# Patient Record
Sex: Female | Born: 1975 | Race: White | Hispanic: No | Marital: Single | Smoking: Current every day smoker
Health system: Southern US, Community
[De-identification: ages and names within clinical notes are randomized; demographics above are authoritative.]

## PROBLEM LIST (undated history)

## (undated) DIAGNOSIS — N83209 Unspecified ovarian cyst, unspecified side: Secondary | ICD-10-CM

## (undated) DIAGNOSIS — D649 Anemia, unspecified: Secondary | ICD-10-CM

## (undated) DIAGNOSIS — I10 Essential (primary) hypertension: Secondary | ICD-10-CM

## (undated) DIAGNOSIS — G43909 Migraine, unspecified, not intractable, without status migrainosus: Secondary | ICD-10-CM

## (undated) DIAGNOSIS — F419 Anxiety disorder, unspecified: Secondary | ICD-10-CM

## (undated) DIAGNOSIS — E039 Hypothyroidism, unspecified: Secondary | ICD-10-CM

## (undated) DIAGNOSIS — D519 Vitamin B12 deficiency anemia, unspecified: Secondary | ICD-10-CM

## (undated) DIAGNOSIS — Z87891 Personal history of nicotine dependence: Secondary | ICD-10-CM

## (undated) DIAGNOSIS — R87619 Unspecified abnormal cytological findings in specimens from cervix uteri: Secondary | ICD-10-CM

## (undated) DIAGNOSIS — J45909 Unspecified asthma, uncomplicated: Secondary | ICD-10-CM

## (undated) DIAGNOSIS — G4730 Sleep apnea, unspecified: Secondary | ICD-10-CM

## (undated) DIAGNOSIS — I4891 Unspecified atrial fibrillation: Secondary | ICD-10-CM

## (undated) HISTORY — DX: Essential (primary) hypertension: I10

## (undated) HISTORY — DX: Unspecified abnormal cytological findings in specimens from cervix uteri: R87.619

## (undated) HISTORY — DX: Anxiety disorder, unspecified: F41.9

## (undated) HISTORY — DX: Migraine, unspecified, not intractable, without status migrainosus: G43.909

## (undated) HISTORY — PX: BREAST EXCISIONAL BIOPSY: SUR124

## (undated) HISTORY — DX: Vitamin B12 deficiency anemia, unspecified: D51.9

## (undated) HISTORY — PX: TONSILLECTOMY: SUR1361

## (undated) HISTORY — DX: Unspecified ovarian cyst, unspecified side: N83.209

## (undated) HISTORY — PX: CERVICAL BIOPSY  W/ LOOP ELECTRODE EXCISION: SUR135

## (undated) HISTORY — DX: Unspecified asthma, uncomplicated: J45.909

## (undated) HISTORY — DX: Anemia, unspecified: D64.9

## (undated) HISTORY — PX: TUBAL LIGATION: SHX77

---

## 2012-07-08 HISTORY — PX: BREAST BIOPSY: SHX20

## 2017-07-20 ENCOUNTER — Encounter (HOSPITAL_COMMUNITY): Payer: Self-pay | Admitting: Emergency Medicine

## 2017-07-20 ENCOUNTER — Ambulatory Visit (HOSPITAL_COMMUNITY)
Admission: EM | Admit: 2017-07-20 | Discharge: 2017-07-20 | Disposition: A | Discharge status: Home / Self Care | Payer: Self-pay | Attending: Internal Medicine | Admitting: Internal Medicine

## 2017-07-20 DIAGNOSIS — M7662 Achilles tendinitis, left leg: Secondary | ICD-10-CM

## 2017-07-20 DIAGNOSIS — E039 Hypothyroidism, unspecified: Secondary | ICD-10-CM

## 2017-07-20 MED ORDER — LEVOTHYROXINE SODIUM 75 MCG PO TABS: 75.0000 ug | ORAL_TABLET | Freq: Every day | ORAL | 0 refills | Status: DC

## 2017-07-20 MED ORDER — METHYLPREDNISOLONE SODIUM SUCC 125 MG IJ SOLR
80.0000 mg | Freq: Once | INTRAMUSCULAR | Status: AC
  Administered 2017-07-20: 80 mg via INTRAMUSCULAR

## 2017-07-20 MED ORDER — MELOXICAM 7.5 MG PO TABS: 7.5000 mg | ORAL_TABLET | Freq: Two times a day (BID) | ORAL | 0 refills | Status: AC

## 2017-07-20 MED ORDER — METHYLPREDNISOLONE SODIUM SUCC 125 MG IJ SOLR
INTRAMUSCULAR | Status: AC
  Filled 2017-07-20: qty 2

## 2017-07-20 NOTE — ED Provider Notes (Signed)
[facility]    CSN: [id number] Arrival date & time: 07/20/17  1828     History   Chief Complaint Chief Complaint  Patient presents with  . Tendonitis    HPI Emily Maldonado is a 42 y.o. female.   She presents today after a forceful dorsiflexion of her left foot yesterday, stepping off the edge of a dog, which has caused significant swelling and pain at the base of the Achilles tendon.  She has injured her left Achilles tendon before.  She was able to walk into the urgent care independently, but has not been able to put a shoe on because of pain in the posterior left heel.  No other injury reported Also, recently moved to this area from [place] and is looking for PCP.  Needs a refill on Synthroid 75 mics per day.    HPI  History reviewed. Hx hypothyroidism, achilles tendinitis  History reviewed. No pertinent surgical history.    Home Medications    Prior to Admission medications   Medication Sig Start Date End Date Taking? Authorizing Provider  levothyroxine (SYNTHROID) 75 MCG tablet Take 1 tablet (75 mcg total) by mouth daily before breakfast. 07/20/17 08/19/17  Isa RankinMurray, Keyoni Lapinski Wilson, MD  meloxicam (MOBIC) 7.5 MG tablet Take 1 tablet (7.5 mg total) by mouth 2 (two) times daily for 15 days. 07/20/17 08/04/17  Isa RankinMurray, Anderia Lorenzo Wilson, MD    Family History History reviewed. No pertinent family history.  Social History Social History   Tobacco Use  . Smoking status: Current Every Day Smoker    Packs/day: 1.00    Types: Cigarettes  . Smokeless tobacco: Never Used  Substance Use Topics  . Alcohol use: Yes  . Drug use: Not on file     Allergies   Penicillins; Shellfish allergy; and Zithromax [azithromycin]   Review of Systems Review of Systems  All other systems reviewed and are negative.    Physical Exam Triage Vital Signs ED Triage Vitals  Enc Vitals Group     BP 07/20/17 1902 137/80     Pulse Rate 07/20/17 1902 91     Resp 07/20/17 1902 18   Temp 07/20/17 1902 98.6 F (37 C)     Temp Source 07/20/17 1902 Oral     SpO2 07/20/17 1902 98 %     Weight --      Height --      Pain Score 07/20/17 1927 6     Pain Loc --    Updated Vital Signs BP 137/80 (BP Location: Left Arm)   Pulse 91   Temp 98.6 F (37 C) (Oral)   Resp 18   SpO2 98%  Physical Exam  Constitutional: She is oriented to person, place, and time. No distress.  HENT:  Head: Atraumatic.  Eyes:  Conjugate gaze observed, no eye redness/discharge  Neck: Neck supple.  Cardiovascular: Normal rate.  Pulmonary/Chest: No respiratory distress.  Abdominal: She exhibits no distension.  Musculoskeletal: Normal range of motion.  Left foot and ankle are slightly swollen compared to the right, most pronounced at the distal aspect of the Achilles tendon insertion in the posterior left heel.  No bruising.  Slightly red, slightly warm.  Good range of motion at the ankle, able to walk but painful. Left calf squeeze produces foot extension at ankle.  Neurological: She is alert and oriented to person, place, and time.  Skin: Skin is warm and dry.  Nursing note and vitals reviewed.    UC Treatments / Results  Procedures Procedures (including critical care time)  Medications Ordered in UC Medications  methylPREDNISolone sodium succinate (SOLU-MEDROL) 125 mg/2 mL injection 80 mg (80 mg Intramuscular Given 07/20/17 1930)   Not able to tolerate boot orthosis due to pain from boot on heel  Final Clinical Impressions(s) / UC Diagnoses   Final diagnoses:  Achilles tendinitis of left lower extremity  Hypothyroidism, unspecified type   Exam findings consistent with mild re-injury of achilles tendon (left).  Injection of solumedrol (steroid) was given at the urgent care today to help with swelling/pain.  Prescription for meloxicam (anti inflammatory/pain reliever) and also for synthroid (for thyroid) were sent to the pharmacy.  Ice for 5-10 minutes several times daily to help with  swelling and pain.  Wear boot when up and around to compress and decrease movement and pain at the achilles tendon insertion site on the heel.  Followup with sports med or podiatry in a week or two for next steps.    ED Discharge Orders        Ordered    levothyroxine (SYNTHROID) 75 MCG tablet  Daily before breakfast     07/20/17 1917    meloxicam (MOBIC) 7.5 MG tablet  2 times daily     07/20/17 1917          Isa Rankin, MD 07/21/17 [phone]

## 2017-07-20 NOTE — ED Triage Notes (Signed)
Pt reports she inj her left achilles tendon yest while stepping on to a deck..  See Dr. Rennie PlowmanMurray's notes.   Steady gait... A&O x4... NAD.[name]. Ambulatory

## 2017-07-20 NOTE — ED Notes (Signed)
Pt declined cam walker... sts it didn't feel right.... Notified Dr. Dayton ScrapeMurray

## 2017-07-20 NOTE — Discharge Instructions (Addendum)
Exam findings consistent with mild re-injury of achilles tendon (left).  Injection of solumedrol (steroid) was given at the urgent care today to help with swelling/pain.  Prescription for meloxicam (anti inflammatory/pain reliever) and also for synthroid (for thyroid) were sent to the pharmacy.  Ice for 5-10 minutes several times daily to help with swelling and pain.  Wear boot when up and around to compress and decrease movement and pain at the achilles tendon insertion site on the heel.  Followup with sports med or podiatry in a week or two for next steps.

## 2017-07-21 ENCOUNTER — Ambulatory Visit (INDEPENDENT_AMBULATORY_CARE_PROVIDER_SITE_OTHER): Payer: BLUE CROSS/BLUE SHIELD

## 2017-07-21 ENCOUNTER — Encounter: Payer: Self-pay | Admitting: Podiatry

## 2017-07-21 ENCOUNTER — Ambulatory Visit (INDEPENDENT_AMBULATORY_CARE_PROVIDER_SITE_OTHER): Payer: Self-pay | Admitting: Podiatry

## 2017-07-21 DIAGNOSIS — M7660 Achilles tendinitis, unspecified leg: Secondary | ICD-10-CM

## 2017-07-21 DIAGNOSIS — M779 Enthesopathy, unspecified: Secondary | ICD-10-CM

## 2017-07-22 NOTE — Progress Notes (Signed)
Subjective:   Patient ID: Emily MoselleAutumn Maldonado, female   DOB: 42 y.o.   MRN: [id number]   HPI Ms. Emily BorsKocaman presents the office today for concerns of Achilles tendon pain.  She states that she was on vacation in [place] and she stepped off a deck and she injured her Achilles tendon.  She states that she noticed swelling and bruising to the area.  She did go to urgent care yesterday and she was given Solu-Medrol injection as well as meloxicam.  They tried putting her into a cam boot however she could not tolerate the one that they had.  She does have a history of Achilles tendon injury in the same side but never rupture.  She has no other concerns today.   Review of Systems  All other systems reviewed and are negative.       Objective:  Physical Exam  General: AAO x3, NAD  Dermatological: Skin is warm, dry and supple bilateral. Nails x 10 are well manicured; remaining integument appears unremarkable at this time. There are no open sores, no preulcerative lesions, no rash or signs of infection present.  Vascular: Dorsalis Pedis artery and Posterior Tibial artery pedal pulses are 2/4 bilateral with immedate capillary fill time. There is no pain with calf compression, swelling, warmth, erythema.   Neruologic: Grossly intact via light touch bilateral. Vibratory intact via tuning fork bilateral. Protective threshold with Semmes Wienstein monofilament intact to all pedal sites bilateral.   Musculoskeletal: There is tenderness to palpation on the distal portion of the Achilles tendon on the insertion into the calcaneus.  Thompson test is negative and the Achilles tendon appears to be intact.  Is able to dorsiflex and plantarflex against resistance.  There is some mild edema to the posterior calcaneus.  There is no erythema or increase in warmth.  No pain with lateral compression of the calcaneus.  No other area of tenderness identified today.  Muscular strength 5/5 in all groups tested bilateral.  Gait:  Unassisted, Nonantalgic.       Assessment:   Achilles tendinitis left side     Plan:  -Treatment options discussed including all alternatives, risks, and complications -Etiology of symptoms were discussed -X-rays were obtained and reviewed with the patient.  Posterior calcaneal spurring is present.  There is no evidence of acute fracture identified. -I do not believe that she has a tendon rupture but the tendon may have a partial tear given the swelling and pain.  I recommend immobilization in the cam boot Cam walker was dispensed today.  Ice and elevation as well as meloxicam.  We discussed an MRI we will immobilized for short amount of time.  If she continues to have symptoms is not improving in the next week we will go ahead and order an MRI to rule out Achilles tendon tear.  If symptoms are improving we will continue immobilization and start therapy.  She agrees this plan has no further questions or concerns. -Note was provided to return to work next Tuesday if able.  Vivi BarrackMatthew R Wagoner DPM

## 2017-08-14 ENCOUNTER — Ambulatory Visit: Payer: BLUE CROSS/BLUE SHIELD | Admitting: Podiatry

## 2017-08-15 ENCOUNTER — Ambulatory Visit: Payer: BLUE CROSS/BLUE SHIELD | Admitting: Podiatry

## 2017-08-15 DIAGNOSIS — Z79899 Other long term (current) drug therapy: Secondary | ICD-10-CM

## 2017-08-15 DIAGNOSIS — M7660 Achilles tendinitis, unspecified leg: Secondary | ICD-10-CM | POA: Diagnosis not present

## 2017-08-15 DIAGNOSIS — B351 Tinea unguium: Secondary | ICD-10-CM

## 2017-08-15 LAB — HEPATIC FUNCTION PANEL
AG Ratio: 1.4 (calc) (ref 1.0–2.5)
ALKALINE PHOSPHATASE (APISO): 85 U/L (ref 33–115)
ALT: 13 U/L (ref 6–29)
AST: 15 U/L (ref 10–30)
Albumin: 3.9 g/dL (ref 3.6–5.1)
BILIRUBIN DIRECT: 0.1 mg/dL (ref 0.0–0.2)
BILIRUBIN INDIRECT: 0.4 mg/dL (ref 0.2–1.2)
GLOBULIN: 2.7 g/dL (ref 1.9–3.7)
Total Bilirubin: 0.5 mg/dL (ref 0.2–1.2)
Total Protein: 6.6 g/dL (ref 6.1–8.1)

## 2017-08-15 LAB — CBC WITH DIFFERENTIAL/PLATELET
BASOS ABS: 31 {cells}/uL (ref 0–200)
Basophils Relative: 0.4 %
EOS ABS: 169 {cells}/uL (ref 15–500)
Eosinophils Relative: 2.2 %
HCT: 45.7 % — ABNORMAL HIGH (ref 35.0–45.0)
Hemoglobin: 15.2 g/dL (ref 11.7–15.5)
Lymphs Abs: 2895 cells/uL (ref 850–3900)
MCH: 27 pg (ref 27.0–33.0)
MCHC: 33.3 g/dL (ref 32.0–36.0)
MCV: 81.2 fL (ref 80.0–100.0)
MPV: 10.4 fL (ref 7.5–12.5)
Monocytes Relative: 10 %
NEUTROS PCT: 49.8 %
Neutro Abs: 3835 cells/uL (ref 1500–7800)
Platelets: 318 10*3/uL (ref 140–400)
RBC: 5.63 10*6/uL — ABNORMAL HIGH (ref 3.80–5.10)
RDW: 14 % (ref 11.0–15.0)
TOTAL LYMPHOCYTE: 37.6 %
WBC: 7.7 10*3/uL (ref 3.8–10.8)
WBCMIX: 770 {cells}/uL (ref 200–950)

## 2017-08-15 MED ORDER — TERBINAFINE HCL 250 MG PO TABS: 250.0000 mg | ORAL_TABLET | Freq: Every day | ORAL | 0 refills | Status: DC

## 2017-08-15 MED ORDER — METHYLPREDNISOLONE 4 MG PO TBPK: ORAL_TABLET | ORAL | 0 refills | Status: DC

## 2017-08-15 NOTE — Patient Instructions (Signed)

## 2017-08-19 ENCOUNTER — Telehealth: Payer: Self-pay | Admitting: *Deleted

## 2017-08-19 NOTE — Telephone Encounter (Signed)
-----   Message from Vivi BarrackMatthew R Wagoner, DPM sent at 08/18/2017  6:43 AM EST ----- Please let her know that she can start Lamisil. Thanks. Follow-up in 6 weeks

## 2017-08-19 NOTE — Telephone Encounter (Signed)
I informed pt of Dr. Wagoner's review of results and orders. 

## 2017-08-20 NOTE — Progress Notes (Signed)
Subjective: 42 year old female presents the office today for follow-up evaluation of Achilles tendinitis in the left side.  She states that it is much better but still having some discomfort although mildly.  She has returned to regular shoes as well as going back to work.  She denies any redness or warmth and no recent injury or trauma.  Overall her symptoms have improved.  She also would like to discuss nail fungus.  Her nails are thick and discolored yellow discoloration which is been ongoing for some time.  No pain in the nails denies any redness or drainage or swelling. Denies any systemic complaints such as fevers, chills, nausea, vomiting. No acute changes since last appointment, and no other complaints at this time.   Objective: AAO x3, NAD DP/PT pulses palpable bilaterally, CRT less than 3 seconds Nails appear to be somewhat dystrophic, discolored with yellow-brown discoloration and there is consistency with onychomycosis. No pain in the nails there is no signs of infection. Mild discomfort upon palpation to this portion of the Achilles tendon insertion into the calcaneus.  Janee Mornhompson test is negative there is no defect noted within the Achilles tendon.  There is no overlying edema, erythema, increase in warmth.  No pain with lateral compression of the calcaneus.  No pain on the course or insertion of the plantar fascia.  No other areas of tenderness. No open lesions or pre-ulcerative lesions.  No pain with calf compression, swelling, warmth, erythema  Assessment: 42 year old female with improving Achilles tendinitis although is still some intermittent swelling and discomfort; onychomycosis  Plan: -All treatment options discussed with the patient including all alternatives, risks, complications.  -We discussed treatment options for onychomycosis.  After discussion she elected to proceed with oral Lamisil.  Lamisil was prescribed as well as we will check a CBC and LFT prior to starting the  medication.  She is not to start this drug call her with results of the blood work and she verbally understood.  Discussed side effects and success rates of Lamisil. -Medrol Dosepak for Achilles tendinitis.  To continue with stretching, icing.  If symptoms continue discussed EPAT, PT, MRI.  -Patient encouraged to call the office with any questions, concerns, change in symptoms.  -RTC 6 weeks or sooner if needed.   Vivi BarrackMatthew R Wagoner DPM

## 2017-08-22 ENCOUNTER — Encounter: Payer: Self-pay | Admitting: Family Medicine

## 2017-08-22 ENCOUNTER — Ambulatory Visit: Payer: BLUE CROSS/BLUE SHIELD | Admitting: Family Medicine

## 2017-08-22 VITALS — BP 124/74 | HR 98 | Temp 97.8°F | Ht 64.0 in | Wt 269.8 lb

## 2017-08-22 DIAGNOSIS — E878 Other disorders of electrolyte and fluid balance, not elsewhere classified: Secondary | ICD-10-CM

## 2017-08-22 DIAGNOSIS — E039 Hypothyroidism, unspecified: Secondary | ICD-10-CM

## 2017-08-22 DIAGNOSIS — L709 Acne, unspecified: Secondary | ICD-10-CM | POA: Diagnosis not present

## 2017-08-22 DIAGNOSIS — I10 Essential (primary) hypertension: Secondary | ICD-10-CM

## 2017-08-22 LAB — BASIC METABOLIC PANEL
BUN: 12 mg/dL (ref 6–23)
CALCIUM: 8.6 mg/dL (ref 8.4–10.5)
CO2: 30 meq/L (ref 19–32)
CREATININE: 0.66 mg/dL (ref 0.40–1.20)
Chloride: 105 mEq/L (ref 96–112)
GFR: 103.56 mL/min (ref >60.00)
Glucose, Bld: 97 mg/dL (ref 70–99)
Potassium: 3.9 mEq/L (ref 3.5–5.1)
Sodium: 140 mEq/L (ref 135–145)

## 2017-08-22 LAB — TSH: TSH: 2.38 u[IU]/mL (ref 0.35–4.50)

## 2017-08-22 LAB — MAGNESIUM: MAGNESIUM: 1.9 mg/dL (ref 1.5–2.5)

## 2017-08-22 LAB — T3, FREE: T3 FREE: 4.4 pg/mL — AB (ref 2.3–4.2)

## 2017-08-22 LAB — T4, FREE: Free T4: 0.84 ng/dL (ref 0.60–1.60)

## 2017-08-22 MED ORDER — LEVOTHYROXINE SODIUM 75 MCG PO TABS: 75.0000 ug | ORAL_TABLET | Freq: Every day | ORAL | 11 refills | Status: DC

## 2017-08-22 MED ORDER — DOXYCYCLINE HYCLATE 100 MG PO TABS: 100.0000 mg | ORAL_TABLET | Freq: Every day | ORAL | 1 refills | Status: DC

## 2017-08-22 MED ORDER — METOPROLOL SUCCINATE ER 50 MG PO TB24: 50.0000 mg | ORAL_TABLET | Freq: Every day | ORAL | 11 refills | Status: DC

## 2017-08-22 MED ORDER — ADAPALENE 0.1 % EX CREA: TOPICAL_CREAM | Freq: Every day | CUTANEOUS | 0 refills | Status: DC

## 2017-08-22 NOTE — Progress Notes (Signed)
Subjective:  Emily Maldonado is a 42 y.o. female who presents today with a chief complaint of acne and to establish care.   HPI:  Acne, New Problem Several year history.  Has been on several oral and topical medications in the past.  Worsen over the last several months.  Does not take any current medications for this.  She has been seen by dermatology in the past however has not been on retin therapy.  Symptoms are worse with menstrual cycles.  Symptoms improved with systemic antibiotics.  Hypothyroidism, new problem Summary of history.  Her thyroid dose was increased to 75 mcg daily about 4 months ago.  She has not no significant change since increasing her dose.  No palpitations.  No skin changes aside from those noted in the above problem.  No constipation or diarrhea.  History of potassium and magnesium abnormalities She relates this to her thyroid disorder.  She is taking magnesium supplementation.  Hypertension, new problem Stable on metoprolol 50 mg daily.  Tolerates this well without side effects.  No chest pain or shortness of breath.  No dizziness or syncope.  ROS: Per HPI, otherwise a 10 point review of systems was performed and was negative  PMH:  The following were reviewed and entered/updated in epic: Past Medical History:  Diagnosis Date  . Childhood asthma   . Hypertension   . Thyroid disease    Patient Active Problem List   Diagnosis Date Noted  . Acne 08/22/2017  . Hypothyroidism 08/22/2017  . Hypertension 08/22/2017  . Abnormal blood electrolyte level 08/22/2017   Past Surgical History:  Procedure Laterality Date  . BREAST LUMPECTOMY     x2  . TUBAL LIGATION      Mother with history of lung cancer and breast cancer.   Medications- reviewed and updated Current Outpatient Medications  Medication Sig Dispense Refill  . levothyroxine (SYNTHROID, LEVOTHROID) 75 MCG tablet Take 1 tablet (75 mcg total) by mouth daily before breakfast. 30 tablet 11  .  Magnesium 500 MG CAPS Take 500 mg by mouth daily.    . methylPREDNISolone (MEDROL DOSEPAK) 4 MG TBPK tablet Take as directed 21 tablet 0  . metoprolol succinate (TOPROL-XL) 50 MG 24 hr tablet Take 1 tablet (50 mg total) by mouth daily. Take with or immediately following a meal. 30 tablet 11  . omeprazole (PRILOSEC) 20 MG capsule Take 20 mg by mouth daily.    [name]. terbinafine (LAMISIL) 250 MG tablet Take 1 tablet (250 mg total) by mouth daily. 90 tablet 0  . vitamin B-12 (CYANOCOBALAMIN) 1000 MCG tablet Take 5,000 mcg by mouth daily.    [name]. adapalene (DIFFERIN) 0.1 % cream Apply topically at bedtime. 45 g 0  . doxycycline (VIBRA-TABS) 100 MG tablet Take 1 tablet (100 mg total) by mouth daily. 42 tablet 1   No current facility-administered medications for this visit.     Allergies-reviewed and updated Allergies  Allergen Reactions  . Penicillins Anaphylaxis  . Shellfish Allergy Anaphylaxis  . Zithromax [Azithromycin] Rash    Social History   Socioeconomic History  . Marital status: Single    Spouse name: None  . Number of children: None  . Years of education: None  . Highest education level: None  Social Needs  . Financial resource strain: None  . Food insecurity - worry: None  . Food insecurity - inability: None  . Transportation needs - medical: None  . Transportation needs - non-medical: None  Occupational History  . None  Tobacco Use  . Smoking status: Former Smoker    Packs/day: 1.00    Types: Cigarettes    Last attempt to quit: 08/11/2017    Years since quitting: 0.0  . Smokeless tobacco: Never Used  Substance and Sexual Activity  . Alcohol use: Yes  . Drug use: No  . Sexual activity: None  Other Topics Concern  . None  Social History Narrative  . None     Objective:  Physical Exam: BP 124/74 (BP Location: Left Arm, Patient Position: Sitting, Cuff Size: Large)   Pulse 98   Temp 97.8 F (36.6 C) (Oral)   Ht 5\' 4"  (1.626 m)   Wt 269 lb 12.8 oz (122.4 kg)   LMP  08/14/2017   SpO2 96%   BMI 46.31 kg/m   Gen: NAD, resting comfortably HEENT: No thyromegaly. CV: RRR with no murmurs appreciated Pulm: NWOB, CTAB with no crackles, wheezes, or rhonchi GI: Normal bowel sounds present. Soft, Nontender, Nondistended. MSK: No edema, cyanosis, or clubbing noted Skin: Cystic closed comedones noted on left cheek and bridge of nose. Neuro: Grossly normal, moves all extremities Psych: Normal affect and thought content  Assessment/Plan:  Acne Given cystic appearance of acne.  It is reasonable to start oral antibiotics.  We will send in a 6-week course of doxycycline.  We will also start topical therapy with Differin cream.  If symptoms not improving on oral antibiotics, will need referral to dermatology for further management and evaluation.  Hypothyroidism Continue Synthroid 75 mcg daily.  Check TSH, T3, and T4 today.  Hypertension At goal.  Continue metoprolol.  This was refilled today.  History of electronic derangement Check BMET today  Preventive healthcare Obtain records from previous PCP.  Katina Degree. [name], MD 08/22/2017 12:13 PM

## 2017-08-22 NOTE — Assessment & Plan Note (Signed)
Given cystic appearance of acne.  It is reasonable to start oral antibiotics.  We will send in a 6-week course of doxycycline.  We will also start topical therapy with Differin cream.  If symptoms not improving on oral antibiotics, will need referral to dermatology for further management and evaluation.

## 2017-08-22 NOTE — Patient Instructions (Signed)
It was nice meeting you today.  Start the doxy and differin.  We will check blood work.  Please see me 1-2 times yearly.  We should do a physical soon if it has not been done.  Take care, Dr Jimmey RalphParker

## 2017-08-22 NOTE — Assessment & Plan Note (Signed)
At goal.  Continue metoprolol.  This was refilled today.

## 2017-08-22 NOTE — Assessment & Plan Note (Signed)
Continue Synthroid 75 mcg daily.  Check TSH, T3, and T4 today.

## 2017-08-25 ENCOUNTER — Other Ambulatory Visit: Payer: Self-pay

## 2017-08-26 ENCOUNTER — Other Ambulatory Visit: Payer: Self-pay | Admitting: *Deleted

## 2017-08-26 ENCOUNTER — Other Ambulatory Visit: Payer: Self-pay

## 2017-08-26 MED ORDER — METOPROLOL SUCCINATE ER 25 MG PO TB24: 25.0000 mg | ORAL_TABLET | Freq: Every day | ORAL | 3 refills | Status: DC

## 2017-09-05 ENCOUNTER — Other Ambulatory Visit: Payer: Self-pay | Admitting: Family Medicine

## 2017-09-05 DIAGNOSIS — Z1231 Encounter for screening mammogram for malignant neoplasm of breast: Secondary | ICD-10-CM

## 2017-09-09 ENCOUNTER — Ambulatory Visit (INDEPENDENT_AMBULATORY_CARE_PROVIDER_SITE_OTHER): Payer: BLUE CROSS/BLUE SHIELD | Admitting: Family Medicine

## 2017-09-09 ENCOUNTER — Encounter: Payer: Self-pay | Admitting: Family Medicine

## 2017-09-09 VITALS — BP 126/78 | HR 108 | Temp 98.1°F | Ht 64.0 in | Wt 268.8 lb

## 2017-09-09 DIAGNOSIS — Z113 Encounter for screening for infections with a predominantly sexual mode of transmission: Secondary | ICD-10-CM

## 2017-09-09 DIAGNOSIS — Z206 Contact with and (suspected) exposure to human immunodeficiency virus [HIV]: Secondary | ICD-10-CM

## 2017-09-09 NOTE — Progress Notes (Signed)
    Subjective:  Josy Denice BorsKocaman is a 42 y.o. female who presents today for same-day appointment with a chief complaint of HIV exposure.   HPI:  Exposure to HIV, New Problem Pt recently found out that her partner of the last several months is HIV positive.  States that he is on ART and has had an undetectable viral load.  Patient is very upset that he did not disclose this to her sooner.  She would like to be tested for HIV today.  No fevers or chills.  No vaginal discharge.  ROS: Per HPI  Objective:  Physical Exam: BP 126/78 (BP Location: Left Arm, Patient Position: Sitting, Cuff Size: Large)   Pulse (!) 108   Temp 98.1 F (36.7 C) (Oral)   Ht 5\' 4"  (1.626 m)   Wt 268 lb 12.8 oz (121.9 kg)   LMP 08/14/2017   SpO2 98%   BMI 46.14 kg/m   Gen: NAD, resting comfortably Psych: Intermittently tearful, Normal affect and thought content  Assessment/Plan:  Exposure to HIV Check HIV antibody.  Also check RPR.  Patient declined screening for gonorrhea chlamydia and trichomonas today. Discussed possibly starting PREP if she is interesting in continuing with her relationship.  She will need referral to the ID clinic in that case.  Gave information for behavioral therapy to help her cope with this significantly stressful event.  Katina Degreealeb M. Jimmey RalphParker, MD 09/09/2017 9:34 AM

## 2017-09-09 NOTE — Patient Instructions (Signed)
We will check blood work.  Please let me know if you are interested in starting PREP and we can put in a referral to the ID clinic.  Take care, Dr Jimmey RalphParker

## 2017-09-10 LAB — RPR: RPR: NONREACTIVE

## 2017-09-10 LAB — HIV ANTIBODY (ROUTINE TESTING W REFLEX): HIV: NONREACTIVE

## 2017-09-25 ENCOUNTER — Ambulatory Visit
Admission: RE | Admit: 2017-09-25 | Discharge: 2017-09-25 | Disposition: A | Discharge status: Home / Self Care | Payer: BLUE CROSS/BLUE SHIELD | Source: Ambulatory Visit | Attending: Family Medicine | Admitting: Family Medicine

## 2017-09-25 DIAGNOSIS — Z1231 Encounter for screening mammogram for malignant neoplasm of breast: Secondary | ICD-10-CM | POA: Diagnosis not present

## 2017-09-26 ENCOUNTER — Ambulatory Visit: Payer: BLUE CROSS/BLUE SHIELD | Admitting: Podiatry

## 2017-09-26 ENCOUNTER — Encounter: Payer: Self-pay | Admitting: Podiatry

## 2017-09-26 DIAGNOSIS — M7660 Achilles tendinitis, unspecified leg: Secondary | ICD-10-CM | POA: Diagnosis not present

## 2017-09-26 DIAGNOSIS — Z79899 Other long term (current) drug therapy: Secondary | ICD-10-CM | POA: Diagnosis not present

## 2017-09-26 DIAGNOSIS — B351 Tinea unguium: Secondary | ICD-10-CM

## 2017-09-26 NOTE — Patient Instructions (Signed)

## 2017-09-29 NOTE — Progress Notes (Signed)
Subjective: Taylr presents to the office today for follow-up evaluation of nail fungus.  She states that she is been using Lamisil not having any problems taking the medication she has not had any side effects.  She feels that there may be some mild improvement.  Denies any pain or swelling to the toenail sites.  Overall the Achilles tendon is doing much better.  She is some intermittent discomfort but overall is improving. Denies any systemic complaints such as fevers, chills, nausea, vomiting. No acute changes since last appointment, and no other complaints at this time.   Objective: AAO x3, NAD DP/PT pulses palpable bilaterally, CRT less than 3 seconds No significant discomfort to the Achilles tendon today.  Thompson test is negative.  No swelling or redness. Nails appear to be hypertrophic, dystrophic with ill-defined discoloration.  There is no pain to the nails there is no surrounding redness or drainage.  No clinical signs of infection.  He may be starting to have some proximal clearing. No open lesions or pre-ulcerative lesions.  No pain with calf compression, swelling, warmth, erythema  Assessment: Onychomycosis, currently on Lamisil ; tendinitis   Plan: -All treatment options discussed with the patient including all alternatives, risks, complications.  -This point she is tolerating Lamisil well.  Continue with this for 3 months.  We will recheck LFT and CBC.  Continue to monitor for any side effects.  Discussed potentially going on for fourth month of the Lamisil.  She is going to check in at the end of the 3 months to see how she is doing.  We do 1/796-month we will recheck blood work. -Continue wearing supportive shoes as well as continue stretching, icing to the Achilles tendon daily. -Patient encouraged to call the office with any questions, concerns, change in symptoms.   Vivi BarrackMatthew R Kaedance Magos DPM

## 2017-10-16 DIAGNOSIS — Z79899 Other long term (current) drug therapy: Secondary | ICD-10-CM | POA: Diagnosis not present

## 2017-10-16 LAB — CBC WITH DIFFERENTIAL/PLATELET
BASOS PCT: 0.4 %
Basophils Absolute: 28 cells/uL (ref 0–200)
EOS ABS: 92 {cells}/uL (ref 15–500)
Eosinophils Relative: 1.3 %
HEMATOCRIT: 37.5 % (ref 35.0–45.0)
Hemoglobin: 12.6 g/dL (ref 11.7–15.5)
LYMPHS ABS: 2684 {cells}/uL (ref 850–3900)
MCH: 27.6 pg (ref 27.0–33.0)
MCHC: 33.6 g/dL (ref 32.0–36.0)
MCV: 82.2 fL (ref 80.0–100.0)
MPV: 9.8 fL (ref 7.5–12.5)
Monocytes Relative: 8 %
NEUTROS PCT: 52.5 %
Neutro Abs: 3728 cells/uL (ref 1500–7800)
Platelets: 278 10*3/uL (ref 140–400)
RBC: 4.56 10*6/uL (ref 3.80–5.10)
RDW: 13.5 % (ref 11.0–15.0)
Total Lymphocyte: 37.8 %
WBC: 7.1 10*3/uL (ref 3.8–10.8)
WBCMIX: 568 {cells}/uL (ref 200–950)

## 2017-10-16 LAB — HEPATIC FUNCTION PANEL
AG Ratio: 1.5 (calc) (ref 1.0–2.5)
ALBUMIN MSPROF: 4 g/dL (ref 3.6–5.1)
ALT: 15 U/L (ref 6–29)
AST: 17 U/L (ref 10–30)
Alkaline phosphatase (APISO): 76 U/L (ref 33–115)
BILIRUBIN DIRECT: 0.1 mg/dL (ref 0.0–0.2)
BILIRUBIN TOTAL: 0.4 mg/dL (ref 0.2–1.2)
GLOBULIN: 2.6 g/dL (ref 1.9–3.7)
Indirect Bilirubin: 0.3 mg/dL (calc) (ref 0.2–1.2)
Total Protein: 6.6 g/dL (ref 6.1–8.1)

## 2017-10-20 ENCOUNTER — Telehealth: Payer: Self-pay

## 2017-10-20 NOTE — Telephone Encounter (Signed)
LVM informing patient of normal blood work results and to continue her lamisil

## 2017-10-20 NOTE — Telephone Encounter (Signed)
-----   Message from Vivi BarrackMatthew R Wagoner, DPM sent at 10/17/2017  1:14 PM EDT ----- Blood work normal- can continue lamisil. Please let her know.thanks.

## 2017-12-03 ENCOUNTER — Ambulatory Visit (INDEPENDENT_AMBULATORY_CARE_PROVIDER_SITE_OTHER): Payer: BLUE CROSS/BLUE SHIELD | Admitting: Family Medicine

## 2017-12-03 ENCOUNTER — Observation Stay (HOSPITAL_COMMUNITY)
Admission: EM | Admit: 2017-12-03 | Discharge: 2017-12-05 | Disposition: A | Discharge status: Home / Self Care | Payer: BLUE CROSS/BLUE SHIELD | Attending: Internal Medicine | Admitting: Internal Medicine

## 2017-12-03 ENCOUNTER — Other Ambulatory Visit: Payer: Self-pay

## 2017-12-03 ENCOUNTER — Emergency Department (HOSPITAL_COMMUNITY): Payer: BLUE CROSS/BLUE SHIELD

## 2017-12-03 ENCOUNTER — Encounter (HOSPITAL_COMMUNITY): Payer: Self-pay | Admitting: Family Medicine

## 2017-12-03 ENCOUNTER — Encounter: Payer: Self-pay | Admitting: Family Medicine

## 2017-12-03 VITALS — BP 124/72 | HR 77 | Temp 97.8°F | Ht 64.0 in | Wt 283.8 lb

## 2017-12-03 DIAGNOSIS — R079 Chest pain, unspecified: Secondary | ICD-10-CM | POA: Diagnosis not present

## 2017-12-03 DIAGNOSIS — Z87891 Personal history of nicotine dependence: Secondary | ICD-10-CM | POA: Diagnosis not present

## 2017-12-03 DIAGNOSIS — I10 Essential (primary) hypertension: Secondary | ICD-10-CM | POA: Diagnosis present

## 2017-12-03 DIAGNOSIS — E876 Hypokalemia: Secondary | ICD-10-CM | POA: Insufficient documentation

## 2017-12-03 DIAGNOSIS — E038 Other specified hypothyroidism: Secondary | ICD-10-CM | POA: Diagnosis not present

## 2017-12-03 DIAGNOSIS — Z113 Encounter for screening for infections with a predominantly sexual mode of transmission: Secondary | ICD-10-CM | POA: Diagnosis not present

## 2017-12-03 DIAGNOSIS — Z6841 Body Mass Index (BMI) 40.0 and over, adult: Secondary | ICD-10-CM | POA: Insufficient documentation

## 2017-12-03 DIAGNOSIS — Z880 Allergy status to penicillin: Secondary | ICD-10-CM | POA: Diagnosis not present

## 2017-12-03 DIAGNOSIS — I493 Ventricular premature depolarization: Secondary | ICD-10-CM

## 2017-12-03 DIAGNOSIS — Z8249 Family history of ischemic heart disease and other diseases of the circulatory system: Secondary | ICD-10-CM | POA: Insufficient documentation

## 2017-12-03 DIAGNOSIS — E063 Autoimmune thyroiditis: Secondary | ICD-10-CM

## 2017-12-03 DIAGNOSIS — Z206 Contact with and (suspected) exposure to human immunodeficiency virus [HIV]: Secondary | ICD-10-CM

## 2017-12-03 DIAGNOSIS — R0789 Other chest pain: Secondary | ICD-10-CM | POA: Diagnosis not present

## 2017-12-03 DIAGNOSIS — R0602 Shortness of breath: Secondary | ICD-10-CM

## 2017-12-03 DIAGNOSIS — G4733 Obstructive sleep apnea (adult) (pediatric): Secondary | ICD-10-CM | POA: Insufficient documentation

## 2017-12-03 DIAGNOSIS — E039 Hypothyroidism, unspecified: Secondary | ICD-10-CM | POA: Diagnosis not present

## 2017-12-03 DIAGNOSIS — I200 Unstable angina: Secondary | ICD-10-CM

## 2017-12-03 DIAGNOSIS — E538 Deficiency of other specified B group vitamins: Secondary | ICD-10-CM | POA: Insufficient documentation

## 2017-12-03 HISTORY — DX: Hypothyroidism, unspecified: E03.9

## 2017-12-03 HISTORY — DX: Personal history of nicotine dependence: Z87.891

## 2017-12-03 HISTORY — DX: Morbid (severe) obesity due to excess calories: E66.01

## 2017-12-03 HISTORY — DX: Sleep apnea, unspecified: G47.30

## 2017-12-03 LAB — CBC WITH DIFFERENTIAL/PLATELET
BASOS ABS: 0 10*3/uL (ref 0.0–0.1)
BASOS PCT: 0 %
EOS ABS: 0.1 10*3/uL (ref 0.0–0.7)
EOS PCT: 2 %
HCT: 40.1 % (ref 36.0–46.0)
Hemoglobin: 13.5 g/dL (ref 12.0–15.0)
LYMPHS PCT: 36 %
Lymphs Abs: 2.3 10*3/uL (ref 0.7–4.0)
MCH: 28.8 pg (ref 26.0–34.0)
MCHC: 33.7 g/dL (ref 30.0–36.0)
MCV: 85.5 fL (ref 78.0–100.0)
MONO ABS: 0.4 10*3/uL (ref 0.1–1.0)
Monocytes Relative: 7 %
Neutro Abs: 3.6 10*3/uL (ref 1.7–7.7)
Neutrophils Relative %: 55 %
PLATELETS: 304 10*3/uL (ref 150–400)
RBC: 4.69 MIL/uL (ref 3.87–5.11)
RDW: 13.3 % (ref 11.5–15.5)
WBC: 6.4 10*3/uL (ref 4.0–10.5)

## 2017-12-03 LAB — BASIC METABOLIC PANEL
Anion gap: 10 (ref 5–15)
BUN: 11 mg/dL (ref 6–20)
CALCIUM: 8.6 mg/dL — AB (ref 8.9–10.3)
CHLORIDE: 105 mmol/L (ref 101–111)
CO2: 25 mmol/L (ref 22–32)
CREATININE: 0.67 mg/dL (ref 0.44–1.00)
Glucose, Bld: 94 mg/dL (ref 65–99)
Potassium: 3.5 mmol/L (ref 3.5–5.1)
SODIUM: 140 mmol/L (ref 135–145)

## 2017-12-03 LAB — I-STAT CHEM 8, ED
BUN: 8 mg/dL (ref 6–20)
CHLORIDE: 104 mmol/L (ref 101–111)
Calcium, Ion: 1.11 mmol/L — ABNORMAL LOW (ref 1.15–1.40)
Creatinine, Ser: 0.6 mg/dL (ref 0.44–1.00)
GLUCOSE: 91 mg/dL (ref 65–99)
HCT: 36 % (ref 36.0–46.0)
HEMOGLOBIN: 12.2 g/dL (ref 12.0–15.0)
POTASSIUM: 3.4 mmol/L — AB (ref 3.5–5.1)
Sodium: 142 mmol/L (ref 135–145)
TCO2: 24 mmol/L (ref 22–32)

## 2017-12-03 LAB — HEMOGLOBIN A1C
Hgb A1c MFr Bld: 5.2 % (ref 4.8–5.6)
MEAN PLASMA GLUCOSE: 102.54 mg/dL

## 2017-12-03 LAB — TROPONIN I

## 2017-12-03 LAB — HEPATIC FUNCTION PANEL
ALK PHOS: 74 U/L (ref 38–126)
ALT: 21 U/L (ref 14–54)
AST: 20 U/L (ref 15–41)
Albumin: 3.9 g/dL (ref 3.5–5.0)
BILIRUBIN TOTAL: 0.4 mg/dL (ref 0.3–1.2)
TOTAL PROTEIN: 7.3 g/dL (ref 6.5–8.1)

## 2017-12-03 LAB — MAGNESIUM: Magnesium: 1.8 mg/dL (ref 1.7–2.4)

## 2017-12-03 LAB — I-STAT BETA HCG BLOOD, ED (MC, WL, AP ONLY): I-stat hCG, quantitative: <5 m[IU]/mL (ref <5)

## 2017-12-03 LAB — I-STAT TROPONIN, ED: TROPONIN I, POC: 0 ng/mL (ref 0.00–0.08)

## 2017-12-03 LAB — TSH: TSH: 11.581 u[IU]/mL — ABNORMAL HIGH (ref 0.350–4.500)

## 2017-12-03 MED ORDER — MORPHINE SULFATE (PF) 4 MG/ML IV SOLN
4.0000 mg | Freq: Once | INTRAVENOUS | Status: AC
  Administered 2017-12-03: 4 mg via INTRAVENOUS
  Filled 2017-12-03: qty 1

## 2017-12-03 MED ORDER — NITROGLYCERIN 0.4 MG SL SUBL
0.4000 mg | SUBLINGUAL_TABLET | SUBLINGUAL | Status: AC | PRN
  Administered 2017-12-03 (×3): 0.4 mg via SUBLINGUAL
  Filled 2017-12-03: qty 1

## 2017-12-03 MED ORDER — POTASSIUM CHLORIDE CRYS ER 20 MEQ PO TBCR
20.0000 meq | EXTENDED_RELEASE_TABLET | Freq: Once | ORAL | Status: AC
  Administered 2017-12-03: 20 meq via ORAL
  Filled 2017-12-03: qty 1

## 2017-12-03 MED ORDER — ASPIRIN 300 MG RE SUPP: 300.0000 mg | RECTAL | Status: AC

## 2017-12-03 MED ORDER — ONDANSETRON HCL 4 MG/2ML IJ SOLN: 4.0000 mg | Freq: Four times a day (QID) | INTRAMUSCULAR | Status: DC | PRN

## 2017-12-03 MED ORDER — ENOXAPARIN SODIUM 40 MG/0.4ML ~~LOC~~ SOLN
40.0000 mg | SUBCUTANEOUS | Status: DC
  Administered 2017-12-03: 40 mg via SUBCUTANEOUS
  Filled 2017-12-03: qty 0.4

## 2017-12-03 MED ORDER — LEVOTHYROXINE SODIUM 100 MCG IV SOLR
37.5000 ug | Freq: Every day | INTRAVENOUS | Status: DC
  Administered 2017-12-04: 37.5 ug via INTRAVENOUS
  Filled 2017-12-03: qty 5

## 2017-12-03 MED ORDER — ACETAMINOPHEN 325 MG PO TABS
650.0000 mg | ORAL_TABLET | ORAL | Status: DC | PRN
  Administered 2017-12-05: 650 mg via ORAL
  Filled 2017-12-03: qty 2

## 2017-12-03 MED ORDER — LORAZEPAM 2 MG/ML IJ SOLN
0.5000 mg | Freq: Once | INTRAMUSCULAR | Status: AC
  Administered 2017-12-03: 0.5 mg via INTRAVENOUS
  Filled 2017-12-03: qty 1

## 2017-12-03 MED ORDER — NITROGLYCERIN 0.4 MG SL SUBL: 0.4000 mg | SUBLINGUAL_TABLET | SUBLINGUAL | Status: DC | PRN

## 2017-12-03 MED ORDER — METOPROLOL SUCCINATE ER 25 MG PO TB24
25.0000 mg | ORAL_TABLET | Freq: Every day | ORAL | Status: DC
  Administered 2017-12-03 – 2017-12-05 (×3): 25 mg via ORAL
  Filled 2017-12-03 (×3): qty 1

## 2017-12-03 MED ORDER — ASPIRIN 81 MG PO CHEW
324.0000 mg | CHEWABLE_TABLET | ORAL | Status: AC
  Administered 2017-12-03: 324 mg via ORAL
  Filled 2017-12-03 (×2): qty 4

## 2017-12-03 MED ORDER — MORPHINE SULFATE (PF) 4 MG/ML IV SOLN
4.0000 mg | INTRAVENOUS | Status: DC | PRN
  Administered 2017-12-03: 4 mg via INTRAVENOUS
  Filled 2017-12-03: qty 1

## 2017-12-03 NOTE — ED Notes (Signed)
Patient transported to X-ray 

## 2017-12-03 NOTE — ED Notes (Signed)
Pt is continuing to have intermittent chest pressure. ED Provider has been made aware.

## 2017-12-03 NOTE — Patient Instructions (Signed)
Please go to [facility] ED.  The on-call cardiologist recommended that you have your cardiac enzymes cycled.  Please come back for a lab visit for your other testing  Take care, Dr [name]

## 2017-12-03 NOTE — ED Provider Notes (Signed)
[facility]-EMERGENCY DEPT Provider Note   CSN: [id number] Arrival date & time: 12/03/17  1158     History   Chief Complaint Chief Complaint  Patient presents with  . Chest Pain  . Shortness of Breath  . Dizziness    HPI Emily Maldonado is a 42 y.o. female.  Patient states she is been having chest pain on and off since yesterday afternoon.  She felt like she had the severe pressure on her chest.  She also had significant perspiring at one point and some shortness of breath.  Patient was seen by her PCP today who did an EKG that showed some nonspecific changes.  The PCP spoke with cardiology who felt like the patient needed to be evaluated in the emergency department  The history is provided by the patient. No language interpreter was used.  Chest Pain   This is a new problem. The current episode started 2 days ago. The problem occurs constantly. The problem has been resolved. The pain is associated with rest. The pain is present in the substernal region. The pain is at a severity of 7/10. The pain is moderate. The quality of the pain is described as dull. The pain does not radiate. Pertinent negatives include no abdominal pain, no back pain, no cough and no headaches.  Pertinent negatives for past medical history include no seizures.    Past Medical History:  Diagnosis Date  . Childhood asthma   . Hypertension   . Sleep apnea    Use C-pap   . Thyroid disease     Patient Active Problem List   Diagnosis Date Noted  . Acne 08/22/2017  . Hypothyroidism 08/22/2017  . Hypertension 08/22/2017  . Abnormal blood electrolyte level 08/22/2017    Past Surgical History:  Procedure Laterality Date  . BREAST BIOPSY Left 2014  . BREAST EXCISIONAL BIOPSY Right   . TUBAL LIGATION       OB History   None      Home Medications    Prior to Admission medications   Medication Sig Start Date End Date Taking? Authorizing Provider  adapalene (DIFFERIN) 0.1 %  cream Apply topically at bedtime. 08/22/17  Yes Ardith Dark, MD  aspirin-acetaminophen-caffeine [facility] MIGRAINE) [phone] MG tablet Take 2 tablets by mouth at bedtime as needed for headache or migraine.   Yes [provider]  ibuprofen (ADVIL,MOTRIN) 200 MG tablet Take 600 mg by mouth daily as needed for headache.   Yes [provider]  levothyroxine (SYNTHROID, LEVOTHROID) 75 MCG tablet Take 1 tablet (75 mcg total) by mouth daily before breakfast. 08/22/17  Yes Ardith Dark, MD  Magnesium 500 MG CAPS Take 500 mg by mouth daily.   Yes [provider]  metoprolol succinate (TOPROL-XL) 25 MG 24 hr tablet Take 1 tablet (25 mg total) by mouth daily. 08/26/17  Yes Ardith Dark, MD  omeprazole (PRILOSEC) 20 MG capsule Take 20 mg by mouth daily.   Yes [provider]  terbinafine (LAMISIL) 250 MG tablet Take 1 tablet (250 mg total) by mouth daily. 08/15/17  Yes Vivi Barrack, DPM  vitamin B-12 (CYANOCOBALAMIN) 1000 MCG tablet Take 5,000 mcg by mouth daily.   Yes [provider]    Family History Family History  Problem Relation Age of Onset  . Breast cancer Mother 32    Social History Social History   Tobacco Use  . Smoking status: Former Smoker    Packs/day: 1.00    Types:  Cigarettes    Last attempt to quit: 08/11/2017    Years since quitting: 0.3  . Smokeless tobacco: Never Used  Substance Use Topics  . Alcohol use: Yes    Comment: Once a year  . Drug use: No     Allergies   Penicillins; Shellfish allergy; and Zithromax [azithromycin]   Review of Systems Review of Systems  Constitutional: Negative for appetite change and fatigue.  HENT: Negative for congestion, ear discharge and sinus pressure.   Eyes: Negative for discharge.  Respiratory: Negative for cough.   Cardiovascular: Positive for chest pain.  Gastrointestinal: Negative for abdominal pain and diarrhea.  Genitourinary: Negative for frequency and hematuria.    Musculoskeletal: Negative for back pain.  Skin: Negative for rash.  Neurological: Negative for seizures and headaches.  Psychiatric/Behavioral: Negative for hallucinations.     Physical Exam Updated Vital Signs BP 139/89   Pulse 73   Temp 98.9 F (37.2 C) (Oral)   Resp (!) 21   Ht  (1.626 m)   Wt 128.7 kg (283 lb 12 oz)   LMP 11/27/2017   SpO2 96%   BMI 48.71 kg/m   Physical Exam  Constitutional: She is oriented to person, place, and time. She appears well-developed.  HENT:  Head: Normocephalic.  Eyes: Conjunctivae and EOM are normal. No scleral icterus.  Neck: Neck supple. No thyromegaly present.  Cardiovascular: Normal rate and regular rhythm. Exam reveals no gallop and no friction rub.  No murmur heard. Pulmonary/Chest: No stridor. She has no wheezes. She has no rales. She exhibits no tenderness.  Abdominal: She exhibits no distension. There is no tenderness. There is no rebound.  Musculoskeletal: Normal range of motion. She exhibits no edema.  Lymphadenopathy:    She has no cervical adenopathy.  Neurological: She is oriented to person, place, and time. She exhibits normal muscle tone. Coordination normal.  Skin: No rash noted. No erythema.  Psychiatric: She has a normal mood and affect. Her behavior is normal.     ED Treatments / Results  Labs (all labs ordered are listed, but only abnormal results are displayed) Labs Reviewed  BASIC METABOLIC PANEL - Abnormal; Notable for the following components:      Result Value   Calcium 8.6 (*)    All other components within normal limits  HEPATIC FUNCTION PANEL - Abnormal; Notable for the following components:   Bilirubin, Direct <0.1 (*)    All other components within normal limits  I-STAT CHEM 8, ED - Abnormal; Notable for the following components:   Potassium 3.4 (*)    Calcium, Ion 1.11 (*)    All other components within normal limits  CBC WITH DIFFERENTIAL/PLATELET  I-STAT TROPONIN, ED  I-STAT BETA HCG  BLOOD, ED ([facility], [facility], [facility] ONLY)    EKG EKG Interpretation  Date/Time:  Wednesday Dec 03 2017 12:07:40 EDT Ventricular Rate:  85 PR Interval:  130 QRS Duration: 88 QT Interval:  390 QTC Calculation: 464 R Axis:   90 Text Interpretation:  Normal sinus rhythm Rightward axis Nonspecific T wave abnormality Abnormal ECG Confirmed by Bethann Berkshire [phone]) on 12/03/2017 1:55:23 PM Also confirmed by Bethann Berkshire [phone]), editor Barbette Hair [phone])  on 12/03/2017 2:00:38 PM   Radiology Dg Chest 2 View  Result Date: 12/03/2017 CLINICAL DATA:  Generalized chest pain with shortness of breath. Symptoms started last night. History of hypertension. Ex-smoker. EXAM: CHEST - 2 VIEW COMPARISON:  None. FINDINGS: The heart size and mediastinal contours are normal. The lungs are clear.  There is no pleural effusion or pneumothorax. No acute osseous findings are identified. Telemetry leads overlie the chest. Acromioclavicular degenerative changes are present bilaterally. IMPRESSION: No active cardiopulmonary process. Electronically Signed   By: Carey Bullocks M.D.   On: 12/03/2017 13:41    Procedures Procedures (including critical care time)  Medications Ordered in ED Medications  nitroGLYCERIN (NITROSTAT) SL tablet 0.4 mg (0.4 mg Sublingual Given 12/03/17 1429)  LORazepam (ATIVAN) injection 0.5 mg (0.5 mg Intravenous Given 12/03/17 1411)     Initial Impression / Assessment and Plan / ED Course  I have reviewed the triage vital signs and the nursing notes.  Pertinent labs & imaging results that were available during my care of the patient were reviewed by me and considered in my medical decision making (see chart for details).     Patient with 24 hours of on and off chest pain.  CBC chemistries troponin chest x-ray unremarkable.  EKG shows nonspecific changes.  Patient blood pressure is poorly controlled.  She will be admitted to medicine for chest pain rule out and possible stress test  Final  Clinical Impressions(s) / ED Diagnoses   Final diagnoses:  Unstable angina pectoris [facility])    ED Discharge Orders    None       Bethann Berkshire, MD 12/03/17 1529

## 2017-12-03 NOTE — ED Notes (Signed)
Care Handoff given to [facility]

## 2017-12-03 NOTE — Progress Notes (Signed)
    Subjective:  Emily Maldonado is a 42 y.o. female who presents today for same-day appointment with a chief complaint of shortness of breath.   HPI:  Shortness of Breath, acute problem Symptoms started last night.  Stable over that time.  Associated with vertigo, chest heaviness, and head/neck pressure.  Some ear pain as well.  No obvious alleviating factors.  [place] severe allergies however she does not think this is contributing significantly.  Had sore throat last week that is resolved.  No nausea or vomiting.  No other clear precipitating events.  No obvious alleviating or aggravating factors.  She is currently having chest pain and "heaviness".  No treatment tried.  No reported fevers or chills.  Patient does not have a history of generalized anxiety.  Reports that she has had occasional symptoms in the past of anxiety that is usually related to her thyroid levels being off.  ROS: Per HPI  PMH: She reports that she quit smoking about 3 months ago. Her smoking use included cigarettes. She smoked 1.00 pack per day. She has never used smokeless tobacco. She reports that she drinks alcohol. She reports that she does not use drugs.  Objective:  Physical Exam: BP 124/72 (BP Location: Left Arm, Patient Position: Sitting, Cuff Size: Large)   Pulse 77   Temp 97.8 F (36.6 C) (Oral)   Ht  (1.626 m)   Wt 283 lb 12.8 oz (128.7 kg)   SpO2 97%   BMI 48.71 kg/m   Gen: NAD, resting comfortably HEENT: TMs clear bilaterally.  Nasal mucosa erythematous and edematous with thick, white nasal discharge.  OP clear. CV: RRR with no murmurs appreciated Pulm: NWOB, CTAB with no crackles, wheezes, or rhonchi  EKG: NSR. TWI in V3, V4, and V5 (new per pt)  Assessment/Plan:  Shortness of breath Patient with T wave inversions in V3 through V5 which are new per patient.  Discussed with on-call cardiologist, Dr. [place] who recommended patient to the ED for ACS  rule out.  Patient was advised to this.  She will be going to [facility] emergency department for further evaluation.  Exposure to HIV Future order for HIV and STD screening placed.  Time Spent: I spent >25 minutes face-to-face with the patient, with more than half spent on counseling for etiology/management for her chest pain and coordinating care.  Katina Degree. [name], MD 12/03/2017 10:41 AM

## 2017-12-03 NOTE — ED Notes (Signed)
ED TO INPATIENT HANDOFF REPORT  Name/Age/Gender Emily Maldonado 42 y.o. female  Code Status   Home/SNF/Other Home  Chief Complaint chest pain / SHOB   Level of Care/Admitting Diagnosis ED Disposition    ED Disposition Condition [facility] Area: [place] [[id number]]  Level of Care: Telemetry [5]  Admit to tele based on following criteria: Monitor for Ischemic changes  Diagnosis: Chest pain [073710]  Admitting Physician: Mariel Aloe [[id number]]  Attending Physician: Mariel Aloe [phone]  PT Class (Do Not Modify): Observation [104]  PT Acc Code (Do Not Modify): Observation [10022]       Medical History Past Medical History:  Diagnosis Date  . Childhood asthma   . Hypertension   . Sleep apnea    Use C-pap   . Thyroid disease     Allergies Allergies  Allergen Reactions  . Penicillins Anaphylaxis    Has patient had a PCN reaction causing immediate rash, facial/tongue/throat swelling, SOB or lightheadedness with hypotension: /No Has patient had a PCN reaction causing severe rash involving mucus membranes or skin necrosis: No Has patient had a PCN reaction that required hospitalization: No Has patient had a PCN reaction occurring within the last 10 years: No If all of the above answers are "NO", then may proceed with Cephalosporin use.   . Shellfish Allergy Anaphylaxis  . Zithromax [Azithromycin] Rash    IV Location/Drains/Wounds Patient Lines/Drains/Airways Status   Active Line/Drains/Airways    Name:   Placement date:   Placement time:   Site:   Days:   Peripheral IV 12/03/17 Left Antecubital   12/03/17    1356    Antecubital   less than 1          Labs/Imaging Results for orders placed or performed during the hospital encounter of 12/03/17 (from the past 48 hour(s))  Basic metabolic panel     Status: Abnormal   Collection Time: 12/03/17  2:00 PM  Result Value Ref Range   Sodium 140 135 - 145 mmol/L   Potassium 3.5 3.5  - 5.1 mmol/L   Chloride 105 101 - 111 mmol/L   CO2 25 22 - 32 mmol/L   Glucose, Bld 94 65 - 99 mg/dL   BUN 11 6 - 20 mg/dL   Creatinine, Ser 0.67 0.44 - 1.00 mg/dL   Calcium 8.6 (L) 8.9 - 10.3 mg/dL   GFR calc non Af Amer >60 >60 mL/min   GFR calc Af Amer >60 >60 mL/min    Comment: (NOTE) The eGFR has been calculated using the CKD EPI equation. This calculation has not been validated in all clinical situations. eGFR's persistently <60 mL/min signify possible Chronic Kidney Disease.    Anion gap 10 5 - 15    Comment: Performed at [facility] [place], [place] [address] [place], [place] [address]  Hepatic function panel     Status: Abnormal   Collection Time: 12/03/17  2:00 PM  Result Value Ref Range   Total Protein 7.3 6.5 - 8.1 g/dL   Albumin 3.9 3.5 - 5.0 g/dL   AST 20 15 - 41 U/L   ALT 21 14 - 54 U/L   Alkaline Phosphatase 74 38 - 126 U/L   Total Bilirubin 0.4 0.3 - 1.2 mg/dL   Bilirubin, Direct <0.1 (L) 0.1 - 0.5 mg/dL   Indirect Bilirubin NOT CALCULATED 0.3 - 0.9 mg/dL    Comment: Performed at [facility], [place] [address] [place], [place]  [address]  CBC with Differential/Platelet     Status: None   Collection Time: 12/03/17  2:00 PM  Result Value Ref Range   WBC 6.4 4.0 - 10.5 K/uL   RBC 4.69 3.87 - 5.11 MIL/uL   Hemoglobin 13.5 12.0 - 15.0 g/dL   HCT 40.1 36.0 - 46.0 %   MCV 85.5 78.0 - 100.0 fL   MCH 28.8 26.0 - 34.0 pg   MCHC 33.7 30.0 - 36.0 g/dL   RDW 13.3 11.5 - 15.5 %   Platelets 304 150 - 400 K/uL   Neutrophils Relative % 55 %   Neutro Abs 3.6 1.7 - 7.7 K/uL   Lymphocytes Relative 36 %   Lymphs Abs 2.3 0.7 - 4.0 K/uL   Monocytes Relative 7 %   Monocytes Absolute 0.4 0.1 - 1.0 K/uL   Eosinophils Relative 2 %   Eosinophils Absolute 0.1 0.0 - 0.7 K/uL   Basophils Relative 0 %   Basophils Absolute 0.0 0.0 - 0.1 K/uL    Comment: Performed at [facility], [place] [address], [place], [place] [address]  I-stat  troponin, ED     Status: None   Collection Time: 12/03/17  2:04 PM  Result Value Ref Range   Troponin i, poc 0.00 0.00 - 0.08 ng/mL   Comment 3            Comment: Due to the release kinetics of cTnI, a negative result within the first hours of the onset of symptoms does not rule out myocardial infarction with certainty. If myocardial infarction is still suspected, repeat the test at appropriate intervals.   I-Stat beta hCG blood, ED     Status: None   Collection Time: 12/03/17  2:04 PM  Result Value Ref Range   I-stat hCG, quantitative <5.0 <5 mIU/mL   Comment 3            Comment:   GEST. AGE      CONC.  (mIU/mL)   <=1 WEEK        5 - 50     2 WEEKS       50 - 500     3 WEEKS       100 - 10,000     4 WEEKS     1,000 - 30,000        FEMALE AND NON-PREGNANT FEMALE:     LESS THAN 5 mIU/mL   I-stat chem 8, ed     Status: Abnormal   Collection Time: 12/03/17  2:53 PM  Result Value Ref Range   Sodium 142 135 - 145 mmol/L   Potassium 3.4 (L) 3.5 - 5.1 mmol/L   Chloride 104 101 - 111 mmol/L   BUN 8 6 - 20 mg/dL   Creatinine, Ser 0.60 0.44 - 1.00 mg/dL   Glucose, Bld 91 65 - 99 mg/dL   Calcium, Ion 1.11 (L) 1.15 - 1.40 mmol/L   TCO2 24 22 - 32 mmol/L   Hemoglobin 12.2 12.0 - 15.0 g/dL   HCT 36.0 36.0 - 46.0 %   Dg Chest 2 View  Result Date: 12/03/2017 CLINICAL DATA:  Generalized chest pain with shortness of breath. Symptoms started last night. History of hypertension. Ex-smoker. EXAM: CHEST - 2 VIEW COMPARISON:  None. FINDINGS: The heart size and mediastinal contours are normal. The lungs are clear. There is no pleural effusion or pneumothorax. No acute osseous findings are identified. Telemetry leads overlie the chest. Acromioclavicular degenerative changes are present bilaterally. IMPRESSION:  No active cardiopulmonary process. Electronically Signed   By: Richardean Sale M.D.   On: 12/03/2017 13:41    Pending Labs Unresulted Labs (From admission, onward)   Start     Ordered    12/03/17 1628  Magnesium  Add-on,   R     12/03/17 1628   Signed and Held  Creatinine, serum  (enoxaparin (LOVENOX)    CrCl >/= 30 ml/min)  Weekly,   R    Comments:  while on enoxaparin therapy    Signed and Held   Signed and Held  TSH  Once,   R     Signed and Held   Signed and Held  Troponin I  Now then every 6 hours,   STAT     Signed and Held   Signed and Held  Hemoglobin A1c  Once,   R     Signed and Held   Signed and Held  Lipid panel  Tomorrow morning,   R     Signed and Held   Signed and Held  CBC  Tomorrow morning,   R     Signed and Held   Signed and Held  Basic metabolic panel  Tomorrow morning,   R     Signed and Held      Vitals/Pain Today's Vitals   12/03/17 1530 12/03/17 1600 12/03/17 1630 12/03/17 1659  BP: 130/90 (!) 151/89 (!) 156/93   Pulse: 72 93 75   Resp: 20 (!) 22 (!) 22   Temp:      TempSrc:      SpO2: 99% 98% 95%   Weight:      Height:      PainSc:    0-No pain    Isolation Precautions No active isolations  Medications Medications  potassium chloride SA (K-DUR,KLOR-CON) CR tablet 20 mEq (has no administration in time range)  nitroGLYCERIN (NITROSTAT) SL tablet 0.4 mg (0.4 mg Sublingual Given 12/03/17 1429)  LORazepam (ATIVAN) injection 0.5 mg (0.5 mg Intravenous Given 12/03/17 1411)  morphine 4 MG/ML injection 4 mg (4 mg Intravenous Given 12/03/17 1626)    Mobility walks

## 2017-12-03 NOTE — ED Notes (Signed)
Patient reported last night when she woke up, developed mid sternal chest pain that radiates to her neck. Patient describes the pain as pressure and heaviness. Patient reports she worked last night and this morning she got into contact with her provider. She had a EKG change from her doctors office.

## 2017-12-03 NOTE — H&P (Signed)
History and Physical    Emily Maldonado ZOX:[id number] DOB: 03/03/1976 DOA: 12/03/2017  PCP: Ardith Dark, MD Patient coming from: Home  Chief Complaint: Chest pain, dyspnea, dizziness  HPI: Emily Maldonado is a 42 y.o. female with medical history significant of hypothyroidism, essential hypertension.  2 days ago, patient had an episode of substernal chest pain when she awoke that morning that lasted a few seconds.  She had 2 episodes.  Yesterday, she had recurrence of her chest tightness with associated lightheadedness upon standing x2 episodes.  While she has these episodes, she has associated dyspnea.  She was working overnight yesterday and had recurrent intermittent chest discomfort.  This morning, she went to her primary care physician and an EKG was obtained which showed some T wave inversions in anterior leads.  Cardiology was consulted as an outpatient and recommended patient be managed in the hospital for ACS rule out.   chest pain has responded to nitroglycerin and Ativan while in the ED.  ED Course: Vitals: Afebrile, normal pulse, normal respirations, normotensive, on room air. Labs: Potassium 3.4, troponin of 0.0 Imaging: Chest x-ray unremarkable Medications/Course: Sublingual nitroglycerin x3, Ativan  Review of Systems: Review of Systems  Constitutional: Negative for chills and fever.  Respiratory: Positive for shortness of breath. Negative for cough, sputum production and wheezing.   Cardiovascular: Positive for chest pain, palpitations and leg swelling (infrequent, chronic). Negative for orthopnea and PND.  Gastrointestinal: Negative for nausea and vomiting.  Neurological: Negative for focal weakness.       Lightheadedness with standing  All other systems reviewed and are negative.   Past Medical History:  Diagnosis Date  . Childhood asthma   . Hypertension   . Sleep apnea    Use C-pap   . Thyroid disease     Past Surgical History:  Procedure Laterality Date  .  BREAST BIOPSY Left 2014  . BREAST EXCISIONAL BIOPSY Right   . TUBAL LIGATION       reports that she quit smoking about 3 months ago. Her smoking use included cigarettes. She smoked 1.00 pack per day. She has never used smokeless tobacco. She reports that she drinks alcohol. She reports that she does not use drugs.  Allergies  Allergen Reactions  . Penicillins Anaphylaxis    Has patient had a PCN reaction causing immediate rash, facial/tongue/throat swelling, SOB or lightheadedness with hypotension: /No Has patient had a PCN reaction causing severe rash involving mucus membranes or skin necrosis: No Has patient had a PCN reaction that required hospitalization: No Has patient had a PCN reaction occurring within the last 10 years: No If all of the above answers are "NO", then may proceed with Cephalosporin use.   . Shellfish Allergy Anaphylaxis  . Zithromax [Azithromycin] Rash    Family History  Problem Relation Age of Onset  . Breast cancer Mother 42  . Heart failure Mother   . Atrial fibrillation Mother   . Heart failure Father   . Hypertension Father   . Heart failure Maternal Grandmother   . Atrial fibrillation Maternal Grandmother   . Stroke Maternal Grandmother     Prior to Admission medications   Medication Sig Start Date End Date Taking? Authorizing Provider  adapalene (DIFFERIN) 0.1 % cream Apply topically at bedtime. 08/22/17  Yes Ardith Dark, MD  aspirin-acetaminophen-caffeine [facility] MIGRAINE) [phone] MG tablet Take 2 tablets by mouth at bedtime as needed for headache or migraine.   Yes [provider]  ibuprofen (ADVIL,MOTRIN) 200 MG tablet Take  600 mg by mouth daily as needed for headache.   Yes [provider]  levothyroxine (SYNTHROID, LEVOTHROID) 75 MCG tablet Take 1 tablet (75 mcg total) by mouth daily before breakfast. 08/22/17  Yes Ardith Dark, MD  Magnesium 500 MG CAPS Take 500 mg by mouth daily.   Yes [provider]    metoprolol succinate (TOPROL-XL) 25 MG 24 hr tablet Take 1 tablet (25 mg total) by mouth daily. 08/26/17  Yes Ardith Dark, MD  omeprazole (PRILOSEC) 20 MG capsule Take 20 mg by mouth daily.   Yes [provider]  terbinafine (LAMISIL) 250 MG tablet Take 1 tablet (250 mg total) by mouth daily. 08/15/17  Yes Vivi Barrack, DPM  vitamin B-12 (CYANOCOBALAMIN) 1000 MCG tablet Take 5,000 mcg by mouth daily.   Yes [provider]    Physical Exam: Vitals:   12/03/17 1427 12/03/17 1430 12/03/17 1433 12/03/17 1500  BP: (!) 159/94 (!) 155/106 (!) 159/102 139/89  Pulse: 81 84 81 73  Resp: (!) 21  Temp:      TempSrc:      SpO2: 95% 94% 94% 96%  Weight:      Height:         Constitutional: NAD, calm, comfortable Eyes: PERRL, lids and conjunctivae normal ENMT: Mucous membranes are moist. Posterior pharynx clear of any exudate or lesions. Normal dentition.  Neck: normal, supple, no masses, no thyromegaly Respiratory: clear to auscultation bilaterally, no wheezing, no crackles. Normal respiratory effort. No accessory muscle use.  Cardiovascular: Regular rate and rhythm, no murmurs / rubs / gallops. No extremity edema. 2+ pedal pulses. No carotid bruits. Reproducible chest pain. Abdomen: no tenderness, no masses palpated. No hepatosplenomegaly. Bowel sounds positive.  Musculoskeletal: no clubbing / cyanosis. No joint deformity upper and lower extremities. Good ROM, no contractures. Normal muscle tone.  Skin: no rashes, lesions, ulcers. No induration Neurologic: CN 2-12 grossly intact. Sensation intact, DTR normal. Strength 5/5 in all 4.  Psychiatric: Normal judgment and insight. Alert and oriented x 3. Normal mood.   Labs on Admission: I have personally reviewed following labs and imaging studies  CBC: Recent Labs  Lab 12/03/17 1400 12/03/17 1453  WBC 6.4  --   NEUTROABS 3.6  --   HGB 13.5 12.2  HCT 40.1 36.0  MCV 85.5  --   PLT 304  --    Basic  Metabolic Panel: Recent Labs  Lab 12/03/17 1400 12/03/17 1453  NA 140 142  K 3.5 3.4*  CL 105 104  CO2 25  --   GLUCOSE 94 91  BUN 11 8  CREATININE 0.67 0.60  CALCIUM 8.6*  --    GFR: Estimated Creatinine Clearance: 120.7 mL/min (by C-G formula based on SCr of 0.6 mg/dL). Liver Function Tests: Recent Labs  Lab 12/03/17 1400  AST 20  ALT 21  ALKPHOS 74  BILITOT 0.4  PROT 7.3  ALBUMIN 3.9     Radiological Exams on Admission: Dg Chest 2 View  Result Date: 12/03/2017 CLINICAL DATA:  Generalized chest pain with shortness of breath. Symptoms started last night. History of hypertension. Ex-smoker. EXAM: CHEST - 2 VIEW COMPARISON:  None. FINDINGS: The heart size and mediastinal contours are normal. The lungs are clear. There is no pleural effusion or pneumothorax. No acute osseous findings are identified. Telemetry leads overlie the chest. Acromioclavicular degenerative changes are present bilaterally. IMPRESSION: No active cardiopulmonary process. Electronically Signed   By: Carey Bullocks M.D.   On:  12/03/2017 13:41    EKG: Independently reviewed.  Diffuse T wave flattening.  PVC noted.  Assessment/Plan Active Problems:   Hypothyroidism   Hypertension   Chest pain  Chest pain Patient with a heart score of 4.  Patient with intermittent chest pain continued that is somewhat reproducible. Possibly GI related as well as patient has had issues with reflux in the past. -Cardiology consulted and will see in the morning -Continue morphine and nitroglycerin as needed -TSH, lipid panel, hemoglobin A1c -Cycle troponin; if elevated would recommend starting heparin overnight -Orthostatic vital signs  Essential hypertension Initially hypertensive which is improved. -Continue Toprol-XL 25 mg daily  Hypothyroidism Last TSH within normal limits. 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 -Will order HIV ab test while inpatient   DVT  prophylaxis: Lovenox Code Status: Full code Family Communication: None at bedside Disposition Plan: Discharge likely home tomorrow if work-up is negative for ACS Consults called: Cardiology Admission status: Observation, telemetry   Jacquelin Hawking, MD [facility] Pager [phone]  If 7PM-7AM, please contact night-coverage [email or web address] Password [facility]  12/03/2017, 4:03 PM

## 2017-12-04 ENCOUNTER — Other Ambulatory Visit (HOSPITAL_COMMUNITY): Payer: BLUE CROSS/BLUE SHIELD

## 2017-12-04 ENCOUNTER — Encounter (HOSPITAL_COMMUNITY): Payer: Self-pay | Admitting: Physician Assistant

## 2017-12-04 ENCOUNTER — Ambulatory Visit (HOSPITAL_COMMUNITY)
Admission: EM | Disposition: A | Discharge status: Home / Self Care | Payer: Self-pay | Source: Home / Self Care | Attending: Emergency Medicine

## 2017-12-04 DIAGNOSIS — R079 Chest pain, unspecified: Secondary | ICD-10-CM

## 2017-12-04 DIAGNOSIS — E876 Hypokalemia: Secondary | ICD-10-CM | POA: Diagnosis not present

## 2017-12-04 DIAGNOSIS — E039 Hypothyroidism, unspecified: Secondary | ICD-10-CM | POA: Diagnosis not present

## 2017-12-04 DIAGNOSIS — E538 Deficiency of other specified B group vitamins: Secondary | ICD-10-CM | POA: Diagnosis not present

## 2017-12-04 DIAGNOSIS — I10 Essential (primary) hypertension: Secondary | ICD-10-CM

## 2017-12-04 DIAGNOSIS — R0789 Other chest pain: Secondary | ICD-10-CM | POA: Diagnosis not present

## 2017-12-04 DIAGNOSIS — R072 Precordial pain: Secondary | ICD-10-CM | POA: Diagnosis not present

## 2017-12-04 DIAGNOSIS — R9431 Abnormal electrocardiogram [ECG] [EKG]: Secondary | ICD-10-CM | POA: Diagnosis not present

## 2017-12-04 DIAGNOSIS — I493 Ventricular premature depolarization: Secondary | ICD-10-CM | POA: Diagnosis not present

## 2017-12-04 DIAGNOSIS — F17210 Nicotine dependence, cigarettes, uncomplicated: Secondary | ICD-10-CM | POA: Diagnosis not present

## 2017-12-04 DIAGNOSIS — Z8249 Family history of ischemic heart disease and other diseases of the circulatory system: Secondary | ICD-10-CM

## 2017-12-04 HISTORY — PX: LEFT HEART CATH AND CORONARY ANGIOGRAPHY: CATH118249

## 2017-12-04 LAB — LIPID PANEL
Cholesterol: 155 mg/dL (ref 0–200)
HDL: 28 mg/dL — AB (ref >40)
LDL CALC: 92 mg/dL (ref 0–99)
Total CHOL/HDL Ratio: 5.5 RATIO
Triglycerides: 175 mg/dL — ABNORMAL HIGH (ref <150)
VLDL: 35 mg/dL (ref 0–40)

## 2017-12-04 LAB — CBC
HCT: 40.7 % (ref 36.0–46.0)
Hemoglobin: 13.3 g/dL (ref 12.0–15.0)
MCH: 28.2 pg (ref 26.0–34.0)
MCHC: 32.7 g/dL (ref 30.0–36.0)
MCV: 86.2 fL (ref 78.0–100.0)
PLATELETS: 282 10*3/uL (ref 150–400)
RBC: 4.72 MIL/uL (ref 3.87–5.11)
RDW: 13.3 % (ref 11.5–15.5)
WBC: 5.9 10*3/uL (ref 4.0–10.5)

## 2017-12-04 LAB — BASIC METABOLIC PANEL
ANION GAP: 9 (ref 5–15)
BUN: 13 mg/dL (ref 6–20)
CALCIUM: 8.5 mg/dL — AB (ref 8.9–10.3)
CO2: 24 mmol/L (ref 22–32)
Chloride: 107 mmol/L (ref 101–111)
Creatinine, Ser: 0.65 mg/dL (ref 0.44–1.00)
GLUCOSE: 101 mg/dL — AB (ref 65–99)
Potassium: 3.8 mmol/L (ref 3.5–5.1)
Sodium: 140 mmol/L (ref 135–145)

## 2017-12-04 LAB — HIV ANTIBODY (ROUTINE TESTING W REFLEX): HIV Screen 4th Generation wRfx: NONREACTIVE

## 2017-12-04 LAB — TROPONIN I
Troponin I: <0.03 ng/mL (ref <0.03)
Troponin I: <0.03 ng/mL (ref <0.03)

## 2017-12-04 LAB — PROTIME-INR
INR: 1.09
PROTHROMBIN TIME: 14 s (ref 11.4–15.2)

## 2017-12-04 SURGERY — LEFT HEART CATH AND CORONARY ANGIOGRAPHY
Anesthesia: LOCAL

## 2017-12-04 MED ORDER — ENOXAPARIN SODIUM 150 MG/ML ~~LOC~~ SOLN
130.0000 mg | Freq: Two times a day (BID) | SUBCUTANEOUS | Status: DC
  Administered 2017-12-04 – 2017-12-05 (×2): 130 mg via SUBCUTANEOUS
  Filled 2017-12-04 (×3): qty 0.87

## 2017-12-04 MED ORDER — ASPIRIN EC 81 MG PO TBEC
81.0000 mg | DELAYED_RELEASE_TABLET | Freq: Every day | ORAL | Status: DC
  Administered 2017-12-05: 81 mg via ORAL
  Filled 2017-12-04 (×2): qty 1

## 2017-12-04 MED ORDER — SODIUM CHLORIDE 0.9% FLUSH
3.0000 mL | Freq: Two times a day (BID) | INTRAVENOUS | Status: DC
  Administered 2017-12-05: 3 mL via INTRAVENOUS

## 2017-12-04 MED ORDER — HEPARIN SODIUM (PORCINE) 1000 UNIT/ML IJ SOLN
INTRAMUSCULAR | Status: DC | PRN
  Administered 2017-12-04: 6500 [IU] via INTRAVENOUS

## 2017-12-04 MED ORDER — GI COCKTAIL ~~LOC~~
30.0000 mL | Freq: Three times a day (TID) | ORAL | Status: DC | PRN
  Administered 2017-12-04: 30 mL via ORAL
  Filled 2017-12-04: qty 30

## 2017-12-04 MED ORDER — PANTOPRAZOLE SODIUM 40 MG PO TBEC
40.0000 mg | DELAYED_RELEASE_TABLET | Freq: Every day | ORAL | Status: DC
  Administered 2017-12-05: 40 mg via ORAL
  Filled 2017-12-04: qty 1

## 2017-12-04 MED ORDER — VERAPAMIL HCL 2.5 MG/ML IV SOLN
INTRAVENOUS | Status: DC | PRN
  Administered 2017-12-04: 10 mL via INTRA_ARTERIAL

## 2017-12-04 MED ORDER — MIDAZOLAM HCL 2 MG/2ML IJ SOLN
INTRAMUSCULAR | Status: DC | PRN
  Administered 2017-12-04: 2 mg via INTRAVENOUS

## 2017-12-04 MED ORDER — HEPARIN (PORCINE) IN NACL 2-0.9 UNITS/ML
INTRAMUSCULAR | Status: AC | PRN
  Administered 2017-12-04 (×2): 500 mL

## 2017-12-04 MED ORDER — SODIUM CHLORIDE 0.9% FLUSH: 3.0000 mL | INTRAVENOUS | Status: DC | PRN

## 2017-12-04 MED ORDER — ASPIRIN 81 MG PO CHEW
81.0000 mg | CHEWABLE_TABLET | ORAL | Status: AC
  Administered 2017-12-04: 81 mg via ORAL

## 2017-12-04 MED ORDER — LEVOTHYROXINE SODIUM 100 MCG IV SOLR
12.5000 ug | Freq: Once | INTRAVENOUS | Status: AC
  Administered 2017-12-04: 12.5 ug via INTRAVENOUS
  Filled 2017-12-04: qty 5

## 2017-12-04 MED ORDER — LIDOCAINE HCL (PF) 1 % IJ SOLN
INTRAMUSCULAR | Status: DC | PRN
  Administered 2017-12-04: 2 mL via INTRADERMAL

## 2017-12-04 MED ORDER — FENTANYL CITRATE (PF) 100 MCG/2ML IJ SOLN
INTRAMUSCULAR | Status: DC | PRN
  Administered 2017-12-04: 50 ug via INTRAVENOUS

## 2017-12-04 MED ORDER — HEPARIN (PORCINE) IN NACL 1000-0.9 UT/500ML-% IV SOLN
INTRAVENOUS | Status: AC
  Filled 2017-12-04: qty 1000

## 2017-12-04 MED ORDER — MIDAZOLAM HCL 2 MG/2ML IJ SOLN
INTRAMUSCULAR | Status: AC
  Filled 2017-12-04: qty 2

## 2017-12-04 MED ORDER — SODIUM CHLORIDE 0.9 % IV SOLN
INTRAVENOUS | Status: DC
  Administered 2017-12-04: 23:00:00 via INTRAVENOUS

## 2017-12-04 MED ORDER — HEPARIN SODIUM (PORCINE) 1000 UNIT/ML IJ SOLN
INTRAMUSCULAR | Status: AC
  Filled 2017-12-04: qty 1

## 2017-12-04 MED ORDER — SODIUM CHLORIDE 0.9 % IV SOLN: 250.0000 mL | INTRAVENOUS | Status: DC | PRN

## 2017-12-04 MED ORDER — LEVOTHYROXINE SODIUM 100 MCG IV SOLR
50.0000 ug | Freq: Every day | INTRAVENOUS | Status: DC
  Administered 2017-12-05: 50 ug via INTRAVENOUS
  Filled 2017-12-04: qty 5

## 2017-12-04 MED ORDER — SODIUM CHLORIDE 0.9% FLUSH
3.0000 mL | Freq: Two times a day (BID) | INTRAVENOUS | Status: DC
  Administered 2017-12-04: 3 mL via INTRAVENOUS

## 2017-12-04 MED ORDER — SODIUM CHLORIDE 0.9 % IV SOLN
INTRAVENOUS | Status: DC
  Administered 2017-12-04: 13:00:00 via INTRAVENOUS

## 2017-12-04 MED ORDER — LIDOCAINE HCL (PF) 1 % IJ SOLN
INTRAMUSCULAR | Status: AC
  Filled 2017-12-04: qty 30

## 2017-12-04 MED ORDER — FENTANYL CITRATE (PF) 100 MCG/2ML IJ SOLN
INTRAMUSCULAR | Status: AC
  Filled 2017-12-04: qty 2

## 2017-12-04 MED ORDER — IOHEXOL 350 MG/ML SOLN
INTRAVENOUS | Status: DC | PRN
  Administered 2017-12-04: 50 mL via INTRA_ARTERIAL

## 2017-12-04 MED ORDER — ASPIRIN EC 81 MG PO TBEC: 81.0000 mg | DELAYED_RELEASE_TABLET | Freq: Every day | ORAL | Status: DC

## 2017-12-04 MED ORDER — SODIUM CHLORIDE 0.9 % IV SOLN: INTRAVENOUS | Status: DC

## 2017-12-04 MED ORDER — VERAPAMIL HCL 2.5 MG/ML IV SOLN
INTRAVENOUS | Status: AC
  Filled 2017-12-04: qty 2

## 2017-12-04 SURGICAL SUPPLY — 12 items
CATH 5FR JL3.5 JR4 ANG PIG MP (CATHETERS) ×2 IMPLANT
COVER PRB 48X5XTLSCP FOLD TPE (BAG) ×1 IMPLANT
COVER PROBE 5X48 (BAG) ×1
DEVICE RAD COMP TR BAND LRG (VASCULAR PRODUCTS) ×2 IMPLANT
GUIDEWIRE INQWIRE 1.5J.035X260 (WIRE) ×1 IMPLANT
INQWIRE 1.5J .035X260CM (WIRE) ×2
KIT HEART LEFT (KITS) ×2 IMPLANT
NEEDLE PERC 21GX4CM (NEEDLE) ×2 IMPLANT
PACK CARDIAC CATHETERIZATION (CUSTOM PROCEDURE TRAY) ×2 IMPLANT
SHEATH RAIN RADIAL 21G 6FR (SHEATH) ×2 IMPLANT
TRANSDUCER W/STOPCOCK (MISCELLANEOUS) ×2 IMPLANT
TUBING CIL FLEX 10 FLL-RA (TUBING) ×2 IMPLANT

## 2017-12-04 NOTE — Consult Note (Addendum)
Cardiology Consultation:   Patient ID: Vinson Moselle; [id number]; 1976/05/10   Admit date: 12/03/2017 Date of Consult: 12/04/2017  Primary Care Provider: Ardith Dark, MD Primary Cardiologist: New to Dr. Delton See  Chief Complaint: chest pain   Patient Profile:   Danesha Kirchoff is a 42 y.o. female with a hx of morbid obesity with OSA, childhood asthma, HTN, hypothyroidism, former tobacco abuse who is being seen today for the evaluation of chest pain at the request of Dr. Caleb Popp.  History of Present Illness:   Ms. Denice Bors is originally from [place] but was living in [place] before relocating to [place] for a fresh start. She works as a Engineer, civil (consulting) at [employer].  She was remotely followed by a cardiologist elsewhere and was told that she had early signs of right heart failure and was given instructions to quit smoking. She also has a history of PVCs per her report. She has sleep apnea and uses CPAP.  She smoked for over 20 years and quit in February.  She has had to use intermittent HCTZ for lower extremity edema.  She was in her usual state of health and visited [place] on Monday.  While there, developed a sensation of a substernal chest pain described as a charley horse.  It lasted about 2 to 3 minutes and resolved without intervention.  On Tuesday around 3:30 PM, she awoke in preparation to get ready for her night shift job.  She is not sure if the pain woke her up or she just woke up on her own anyway, but developed again a substernal pressure that migrated up her throat associated with shortness of breath and dizziness.  She tried to talk herself out of it as if it was an anxiety attack.  She got up to walk to the dresser and felt much worse.  This also lasted only about 2 to 3 minutes, and she was able to go on to work as usual.  However, ever since that time has continued to have intermittent recurrences of the above.  It also woke her up on occasion since that time.  Due to the  symptoms, she sought care at primary care and was sent to the emergency department.  She also feels as though she has been throwing PVCs.  One PVC is noted on her EKG.  Otherwise telemetry up on the floor has been unremarkable.  Troponins are negative x4.  She states she can feel when the chest discomfort is about to start.  She is currently chest pain-free but it has continued intermittently through this morning. LDL 92, K 3.4-3.8, LFTs OK, Cr wnl, CBC wnl, TSH 11.5, CXR NAD.  She believes her maternal grandmother had some sort of heart blockage, but details are not clear.  Otherwise there is a family history of heart failure and atrial fibrillation in her parents. Blood pressure elevated in the 170s systolic upon arrival, otherwise not tachycardic, tachypneic or hypoxic. Initial EKG showed TWI inferiorly and V3-V6 with no prior to compare to - TWI is less prominent by EKG today.  Past Medical History:  Diagnosis Date  . Childhood asthma   . Former tobacco use   . Hypertension   . Hypothyroidism   . Morbid obesity (HCC)   . Sleep apnea    Use C-pap     Past Surgical History:  Procedure Laterality Date  . BREAST BIOPSY Left 2014  . BREAST EXCISIONAL BIOPSY Right   . TUBAL LIGATION  Inpatient Medications: Scheduled Meds: . aspirin EC  81 mg Oral Daily  . enoxaparin (LOVENOX) injection  40 mg Subcutaneous Q24H  . levothyroxine  12.5 mcg Intravenous Once  . [START ON 12/05/2017] levothyroxine  50 mcg Intravenous QAC breakfast  . metoprolol succinate  25 mg Oral Daily   Continuous Infusions:  PRN Meds: acetaminophen, morphine injection, nitroGLYCERIN, ondansetron (ZOFRAN) IV  Home Meds: Prior to Admission medications   Medication Sig Start Date End Date Taking? Authorizing Provider  adapalene (DIFFERIN) 0.1 % cream Apply topically at bedtime. 08/22/17  Yes Ardith Dark, MD  aspirin-acetaminophen-caffeine [facility] MIGRAINE) [id number] MG tablet Take 2 tablets by mouth at  bedtime as needed for headache or migraine.   Yes [provider]  ibuprofen (ADVIL,MOTRIN) 200 MG tablet Take 600 mg by mouth daily as needed for headache.   Yes [provider]  levothyroxine (SYNTHROID, LEVOTHROID) 75 MCG tablet Take 1 tablet (75 mcg total) by mouth daily before breakfast. 08/22/17  Yes Ardith Dark, MD  Magnesium 500 MG CAPS Take 500 mg by mouth daily.   Yes [provider]  metoprolol succinate (TOPROL-XL) 25 MG 24 hr tablet Take 1 tablet (25 mg total) by mouth daily. 08/26/17  Yes Ardith Dark, MD  omeprazole (PRILOSEC) 20 MG capsule Take 20 mg by mouth daily.   Yes [provider]  terbinafine (LAMISIL) 250 MG tablet Take 1 tablet (250 mg total) by mouth daily. 08/15/17  Yes Vivi Barrack, DPM  vitamin B-12 (CYANOCOBALAMIN) 1000 MCG tablet Take 5,000 mcg by mouth daily.   Yes [provider]    Allergies:    Allergies  Allergen Reactions  . Penicillins Anaphylaxis    Has patient had a PCN reaction causing immediate rash, facial/tongue/throat swelling, SOB or lightheadedness with hypotension: /No Has patient had a PCN reaction causing severe rash involving mucus membranes or skin necrosis: No Has patient had a PCN reaction that required hospitalization: No Has patient had a PCN reaction occurring within the last 10 years: No If all of the above answers are "NO", then may proceed with Cephalosporin use.   . Shellfish Allergy Anaphylaxis  . Zithromax [Azithromycin] Rash    Social History:   Social History   Socioeconomic History  . Marital status: Single    Spouse name: Not on file  . Number of children: Not on file  . Years of education: Not on file  . Highest education level: Not on file  Occupational History  . Not on file  Social Needs  . Financial resource strain: Not on file  . Food insecurity:    Worry: Not on file    Inability: Not on file  . Transportation needs:    Medical: Not on file     Non-medical: Not on file  Tobacco Use  . Smoking status: Former Smoker    Packs/day: 1.00    Types: Cigarettes    Last attempt to quit: 08/11/2017    Years since quitting: 0.3  . Smokeless tobacco: Never Used  Substance and Sexual Activity  . Alcohol use: Yes    Comment: Once a year  . Drug use: No  . Sexual activity: Not on file  Lifestyle  . Physical activity:    Days per week: Not on file    Minutes per session: Not on file  . Stress: Not on file  Relationships  . Social connections:    Talks on phone: Not on file    Gets together: Not  on file    Attends religious service: Not on file    Active member of club or organization: Not on file    Attends meetings of clubs or organizations: Not on file    Relationship status: Not on file  . Intimate partner violence:    Fear of current or ex partner: Not on file    Emotionally abused: Not on file    Physically abused: Not on file    Forced sexual activity: Not on file  Other Topics Concern  . Not on file  Social History Narrative  . Not on file    Family History:   The patient's family history includes Atrial fibrillation in her maternal grandmother and mother; Breast cancer (age of onset: 84) in her mother; Heart failure in her father, maternal grandmother, and mother; Hypertension in her father; Stroke in her maternal grandmother.  ROS:  Please see the history of present illness.  All other ROS reviewed and negative.     Physical Exam/Data:   Vitals:   12/03/17 1630 12/03/17 1752 12/03/17 2147 12/04/17 0539  BP: (!) 156/93  (!) 128/91 (!) 142/95  Pulse: 75  72 76  Resp: (!) Temp:  98.5 F (36.9 C) 98.1 F (36.7 C) 98 F (36.7 C)  TempSrc:  Oral Oral Oral  SpO2: 95% 99% 97% 97%  Weight:  280 lb 8 oz (127.2 kg)    Height:        Intake/Output Summary (Last 24 hours) at 12/04/2017 0902 Last data filed at 12/04/2017 0800 Gross per 24 hour  Intake -  Output 300 ml  Net -300 ml   Filed Weights    12/03/17 1314 12/03/17 1752  Weight: 283 lb 12 oz (128.7 kg) 280 lb 8 oz (127.2 kg)   Body mass index is 48.15 kg/m.  General: Well developed, well nourished morbidly obese WF in no acute distress. Head: Normocephalic, atraumatic, sclera non-icteric, no xanthomas, nares are without discharge Neck: Negative for carotid bruits. JVD not elevated. Lungs: Clear bilaterally to auscultation without wheezes, rales, or rhonchi. Breathing is unlabored. Heart: RRR with S1 S2. No murmurs, rubs, or gallops appreciated. Abdomen: Soft, non-tender, non-distended with normoactive bowel sounds. No hepatomegaly. No rebound/guarding. No obvious abdominal masses. Msk:  Strength and tone appear normal for age. Extremities: No clubbing or cyanosis. No edema.  Distal pedal pulses are 2+ and equal bilaterally. Neuro: Alert and oriented X 3. No facial asymmetry. No focal deficit. Moves all extremities spontaneously. Psych:  Responds to questions appropriately with a normal affect.  EKG:  The EKG was personally reviewed and demonstrates NSR with slight ST depression,TWI inferiorly and V3-V6.  Relevant CV Studies: none  Laboratory Data:  Chemistry Recent Labs  Lab 12/03/17 1400 12/03/17 1453 12/04/17 0539  NA 140 142 140  K 3.5 3.4* 3.8  CL 105 104 107  CO2 25  --  24  GLUCOSE 94 91 101*  BUN CREATININE 0.67 0.60 0.65  CALCIUM 8.6*  --  8.5*  GFRNONAA >60  --  >60  GFRAA >60  --  >60  ANIONGAP 10  --  9    Recent Labs  Lab 12/03/17 1400  PROT 7.3  ALBUMIN 3.9  AST 20  ALT 21  ALKPHOS 74  BILITOT 0.4   Hematology Recent Labs  Lab 12/03/17 1400 12/03/17 1453 12/04/17 0539  WBC 6.4  --  5.9  RBC 4.69  --  4.72  HGB 13.5  12.2 13.3  HCT 40.1 36.0 40.7  MCV 85.5  --  86.2  MCH 28.8  --  28.2  MCHC 33.7  --  32.7  RDW 13.3  --  13.3  PLT 304  --  282   Cardiac Enzymes Recent Labs  Lab 12/03/17 1806 12/03/17 2353 12/04/17 0539  TROPONINI <0.03 <0.03 <0.03    Recent  Labs  Lab 12/03/17 1404  TROPIPOC 0.00    BNPNo results for input(s): BNP, PROBNP in the last 168 hours.  DDimer No results for input(s): DDIMER in the last 168 hours.  Radiology/Studies:  Dg Chest 2 View  Result Date: 12/03/2017 CLINICAL DATA:  Generalized chest pain with shortness of breath. Symptoms started last night. History of hypertension. Ex-smoker. EXAM: CHEST - 2 VIEW COMPARISON:  None. FINDINGS: The heart size and mediastinal contours are normal. The lungs are clear. There is no pleural effusion or pneumothorax. No acute osseous findings are identified. Telemetry leads overlie the chest. Acromioclavicular degenerative changes are present bilaterally. IMPRESSION: No active cardiopulmonary process. Electronically Signed   By: Carey Bullocks M.D.   On: 12/03/2017 13:41    Assessment and Plan:   1. Chest pain/dyspnea/dizziness with mixed atypical/typical features - negative troponins but EKG is abnormal and concerning for cardiac ischemia. Risk factors include obesity, HTN, 20+ years of tobacco abuse and family history of heart disease. She received  ASA on arrival. Noninvasive testing would be extremely challenging with limited sensitivity given her BMI of 48. Discussed with Dr. Delton See - at this time we would recommend definitive evaluation with cardiac cath over at [facility]. Risks and benefits of cardiac catheterization have been discussed with the patient.  These include bleeding, infection, kidney damage, stroke, heart attack, death.  The patient understands these risks and is willing to proceed. I spoke with cath lab and they added her to our add-on board. Pre-cath orders written. Await cath. Hold off heparin given negative troponins and currently chest pain free.  2. Essential HTN - BP labile, elevated at times. May need additional therapy.  3. Occasional PVCs (has history of such) - seen on EKG but not with any significant frequency on telemetry.  4. Hypokalemia -  repleted by IM.  5. Morbid obesity with OSA - compliant with CPAP. She is aware of need to lose weight.  For questions or updates, please contact [facility] Please consult [email or web address] for contact info under Cardiology/STEMI.   Signed, Laurann Montana, PA-C  12/04/2017 9:02 AM  The patient was seen, examined and discussed with Ronie Spies, PA-C and I agree with the above.   42 y.o. female with a hx of morbid obesity with OSA, childhood asthma, HTN, hypothyroidism, former tobacco abuse (quit in Feb 2019 after 20 years), who is being seen today for the evaluation of chest pain. 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 She denies any DOE, LE edema, orthopnea. She has developed chest pressures at night two days ago, it has progressed to pressure at her neck and arm.  Troponin negative x 3, ECG shows SR and negative T waves in the inferior and anterior leads. Her risk factors include long term smoking, FH of premature CAD, sleep apnea, HTN, morbid obesity. Given morbid obesity (BMI 48) and high pretest probability for CAD we will proceed with a cardiac catheterization instead of nuclear stress testing or coronary CTA.  Tobias Alexander, MD 12/04/2017

## 2017-12-04 NOTE — Progress Notes (Signed)
ANTICOAGULATION CONSULT NOTE - Initial Consult  Pharmacy Consult for Lovenox Indication: VTE treatment, suspected PE   Allergies  Allergen Reactions  . Penicillins Anaphylaxis    Has patient had a PCN reaction causing immediate rash, facial/tongue/throat swelling, SOB or lightheadedness with hypotension: /No Has patient had a PCN reaction causing severe rash involving mucus membranes or skin necrosis: No Has patient had a PCN reaction that required hospitalization: No Has patient had a PCN reaction occurring within the last 10 years: No If all of the above answers are "NO", then may proceed with Cephalosporin use.   . Shellfish Allergy Anaphylaxis  . Zithromax [Azithromycin] Rash    Patient Measurements: Height:  (162.6 cm) Weight: 282 lb 9.6 oz (128.2 kg) IBW/kg (Calculated) : 54.7  Vital Signs: Temp: 98.1 F (36.7 C) (05/30 1834) Temp Source: Oral (05/30 1834) BP: 139/87 (05/30 1834) Pulse Rate: 73 (05/30 1834)  Labs: Recent Labs    12/03/17 1400 12/03/17 1453 12/03/17 1806 12/03/17 2353 12/04/17 0539 12/04/17 1126  HGB 13.5 12.2  --   --  13.3  --   HCT 40.1 36.0  --   --  40.7  --   PLT 304  --   --   --  282  --   LABPROT  --   --   --   --   --  14.0  INR  --   --   --   --   --  1.09  CREATININE 0.67 0.60  --   --  0.65  --   TROPONINI  --   --  <0.03 <0.03 <0.03  --     Estimated Creatinine Clearance: 120.4 mL/min (by C-G formula based on SCr of 0.65 mg/dL).   Medical History: Past Medical History:  Diagnosis Date  . Childhood asthma   . Former tobacco use   . Hypertension   . Hypothyroidism   . Morbid obesity (HCC)   . Sleep apnea    Use C-pap     Medications:  Scheduled:  . [START ON 12/05/2017] aspirin EC  81 mg Oral Daily  . [START ON 12/05/2017] levothyroxine  50 mcg Intravenous QAC breakfast  . metoprolol succinate  25 mg Oral Daily  . [START ON 12/05/2017] pantoprazole  40 mg Oral Q0600  . sodium chloride flush  3 mL Intravenous  Q12H   Infusions:  . sodium chloride    . sodium chloride      Assessment: 34 yoF admitted 5/29 with chest pain/pressure.  S/p cardiac cath on 5/30.  Pt still reporting chest pressure after the cath and will get CTa tomorrow to r/o PE.  Pharmacy is consulted to dose Lovenox for r/o VTE overnight.  TR BAND removed at 1740. Per Cardiology, OK to begin full dose anticoagulation 4 hours after band removal.  SCr 0.65, CrCl > 100 ml/min Weight 128 kg CBC: Hgb 13.3, Plt 282  Goal of Therapy:  Anti-Xa level 0.6-1 units/ml 4hrs after LMWH dose given Monitor platelets by anticoagulation protocol: Yes   Plan:   Lovenox 1 mg/kg SQ q12h.  First dose scheduled at 22:00 (~ 4 hrs after TR band removal)  F/u SCr, CBC, monitor for bleeding, f/u CTa results.  Lynann Beaver PharmD, BCPS Pager [phone] 12/04/2017 7:43 PM

## 2017-12-04 NOTE — Progress Notes (Signed)
PROGRESS NOTE    Vinson Moselle  JXB:[id number] DOB: [birth year] DOA: 12/03/2017 PCP: Ardith Dark, MD   Brief Narrative:  Emily Maldonado is a 42 y.o. female with medical history significant of hypothyroidism, essential hypertension.  2 days ago, patient had an episode of substernal chest pain when she awoke that morning that lasted a few seconds.  She had 2 episodes.  Yesterday, she had recurrence of her chest tightness with associated lightheadedness upon standing x2 episodes.  While she has these episodes, she has associated dyspnea.  She was working overnight yesterday and had recurrent intermittent chest discomfort.  This morning, she went to her primary care physician and an EKG was obtained which showed some T wave inversions in anterior leads.  Cardiology was consulted as an outpatient and recommended patient be managed in the hospital for ACS rule out.   chest pain has responded to nitroglycerin and Ativan while in the ED.  ED Course: Vitals: Afebrile, normal pulse, normal respirations, normotensive, on room air. Labs: Potassium 3.4, troponin of 0.0 Imaging: Chest x-ray unremarkable Medications/Course: Sublingual nitroglycerin x3, Ativan     Assessment & Plan:   Principal Problem:   Chest pain Active Problems:   Hypothyroidism   Hypertension  1 chest pain Patient had presented with chest pain, EKG changes, history of tobacco use, family history of coronary artery disease.  Cardiac enzymes were cycled which were negative x3.  Patient was seen in consultation with cardiology and subsequently underwent cardiac catheterization on 12/04/2017 with findings negative for ACS.  Patient's TSH noted to be elevated at 11.581.  Patient endorses compliance with her medication.  Patient still with complaints of chest pressure which she states radiating to her\left scapular region.  Denies any significant shortness of breath.  Patient does state had a recent long car ride to [place] and back.   Will place on full dose Lovenox tonight.  Check a CT angiogram chest tomorrow as patient has already had a contrast dye load today to rule out PE.  GI cocktail x1.  Follow.  2.  Hypothyroidism TSH elevated at 11.581.  Check a magnesium level, vitamin B12 level.  Increase Synthroid to 50 MCG's IV daily.  Will need repeat thyroid function studies done in about 4 to 6 weeks.  Outpatient follow-up with PCP.  3.  Hypertension Blood pressure currently stable.  Continue Toprol-XL.  4.  Hypokalemia Repleted.  5.  Morbid obesity with OSA CPAP nightly.  6.  Occasional PVCs Check a magnesium level.  Replete electrolytes.  Increase Synthroid due to elevated TSH.  Follow.  7.  Recent exposure to HIV Per PCPs notes HIV test pending.  HIV antibody test nonreactive.  DVT prophylaxis: Lovenox Code Status: Full Family Communication: Updated patient.  No family at bedside. Disposition Plan: Likely home tomorrow if CT angiogram chest is negative with clinical improvement.   Consultants:   Cardiology: Dr. Delton See 12/04/2017  Procedures:   Cardiac catheterization 12/04/2017 per Dr. Clifton James  Chest x-ray 12/03/2017  Antimicrobials:   None   Subjective: Just returning from cardiac catheterization.  Patient still complaining of chest pressure radiating to her left scapular region.  Denies any shortness of breath.  No nausea or vomiting.  Denies any burping.  Objective: Vitals:   12/04/17 1645 12/04/17 1700 12/04/17 1715 12/04/17 1730  BP: 118/68 (!) 130/56 (!) 132/49 (!) 116/47  Pulse: 82 75 74 76  Resp: (!) Temp:      TempSrc:  SpO2: 93% 97% 94% 95%  Weight:      Height:        Intake/Output Summary (Last 24 hours) at 12/04/2017 1832 Last data filed at 12/04/2017 0800 Gross per 24 hour  Intake -  Output 300 ml  Net -300 ml   Filed Weights   12/03/17 1314 12/03/17 1752 12/04/17 1045  Weight: 128.7 kg (283 lb 12 oz) 127.2 kg (280 lb 8 oz) 128.2 kg (282 lb 9.6 oz)      Examination:  General exam: Appears calm and comfortable  Respiratory system: Clear to auscultation. Respiratory effort normal. Cardiovascular system: S1 & S2 heard, RRR. No JVD, murmurs, rubs, gallops or clicks. No pedal edema. Gastrointestinal system: Abdomen is nondistended, soft and nontender. No organomegaly or masses felt. Normal bowel sounds heard. Central nervous system: Alert and oriented. No focal neurological deficits. Extremities: Symmetric 5 x 5 power. Skin: No rashes, lesions or ulcers Psychiatry: Judgement and insight appear normal. Mood & affect appropriate.     Data Reviewed: I have personally reviewed following labs and imaging studies  CBC: Recent Labs  Lab 12/03/17 1400 12/03/17 1453 12/04/17 0539  WBC 6.4  --  5.9  NEUTROABS 3.6  --   --   HGB 13.5 12.2 13.3  HCT 40.1 36.0 40.7  MCV 85.5  --  86.2  PLT 304  --  282   Basic Metabolic Panel: Recent Labs  Lab 12/03/17 1400 12/03/17 1453 12/04/17 0539  NA 140 142 140  K 3.5 3.4* 3.8  CL 105 104 107  CO2 25  --  24  GLUCOSE 94 91 101*  BUN CREATININE 0.67 0.60 0.65  CALCIUM 8.6*  --  8.5*  MG 1.8  --   --    GFR: Estimated Creatinine Clearance: 120.4 mL/min (by C-G formula based on SCr of 0.65 mg/dL). Liver Function Tests: Recent Labs  Lab 12/03/17 1400  AST 20  ALT 21  ALKPHOS 74  BILITOT 0.4  PROT 7.3  ALBUMIN 3.9   No results for input(s): LIPASE, AMYLASE in the last 168 hours. No results for input(s): AMMONIA in the last 168 hours. Coagulation Profile: Recent Labs  Lab 12/04/17 1126  INR 1.09   Cardiac Enzymes: Recent Labs  Lab 12/03/17 1806 12/03/17 2353 12/04/17 0539  TROPONINI <0.03 <0.03 <0.03   BNP (last 3 results) No results for input(s): PROBNP in the last 8760 hours. HbA1C: Recent Labs    12/03/17 1806  HGBA1C 5.2   CBG: No results for input(s): GLUCAP in the last 168 hours. Lipid Profile: Recent Labs    12/04/17 0539  CHOL 155  HDL 28*   LDLCALC 92  TRIG 175*  CHOLHDL 5.5   Thyroid Function Tests: Recent Labs    12/03/17 1806  TSH 11.581*   Anemia Panel: No results for input(s): VITAMINB12, FOLATE, FERRITIN, TIBC, IRON, RETICCTPCT in the last 72 hours. Sepsis Labs: No results for input(s): PROCALCITON, LATICACIDVEN in the last 168 hours.  No results found for this or any previous visit (from the past 240 hour(s)).       Radiology Studies: Dg Chest 2 View  Result Date: 12/03/2017 CLINICAL DATA:  Generalized chest pain with shortness of breath. Symptoms started last night. History of hypertension. Ex-smoker. EXAM: CHEST - 2 VIEW COMPARISON:  None. FINDINGS: The heart size and mediastinal contours are normal. The lungs are clear. There is no pleural effusion or pneumothorax. No acute osseous findings are identified. Telemetry leads overlie the  chest. Acromioclavicular degenerative changes are present bilaterally. IMPRESSION: No active cardiopulmonary process. Electronically Signed   By: Carey Bullocks M.D.   On: 12/03/2017 13:41        Scheduled Meds: . [START ON 12/05/2017] aspirin EC  81 mg Oral Daily  . [START ON 12/05/2017] levothyroxine  50 mcg Intravenous QAC breakfast  . metoprolol succinate  25 mg Oral Daily  . [START ON 12/05/2017] pantoprazole  40 mg Oral Q0600  . sodium chloride flush  3 mL Intravenous Q12H   Continuous Infusions: . sodium chloride 75 mL/hr (12/04/17 1430)  . sodium chloride       LOS: 0 days    Time spent: 40 mins    Ramiro Harvest, MD [facility] Pager [phone]  If 7PM-7AM, please contact night-coverage [email or web address] Password [facility] 12/04/2017, 6:32 PM

## 2017-12-04 NOTE — Interval H&P Note (Signed)
History and Physical Interval Note:  12/04/2017 1:29 PM  Emily Maldonado  has presented today for cardiac cath with the diagnosis of unstable angina  The various methods of treatment have been discussed with the patient and family. After consideration of risks, benefits and other options for treatment, the patient has consented to  Procedure(s): LEFT HEART CATH AND CORONARY ANGIOGRAPHY (N/A) as a surgical intervention .  The patient's history has been reviewed, patient examined, no change in status, stable for surgery.  I have reviewed the patient's chart and labs.  Questions were answered to the patient's satisfaction.    Cath Lab Visit (complete for each Cath Lab visit)  Clinical Evaluation Leading to the Procedure:   ACS: No.  Non-ACS:    Anginal Classification: CCS III  Anti-ischemic medical therapy: Minimal Therapy (1 class of medications)  Non-Invasive Test Results: No non-invasive testing performed  Prior CABG: No previous CABG         Emily Maldonado

## 2017-12-04 NOTE — Care Management Note (Signed)
Case Management Note  Patient Details  Name: Emily Maldonado MRN: [id number] Date of Birth: 1976-04-25  Subjective/Objective:   42 y/o f admitted w/chest pain. From home. Acute to acute transfer to [facility] for cardiac cath via [facility].                 Action/Plan:d/c plan home.   Expected Discharge Date:  (unknown)               Expected Discharge Plan:  Acute to Acute Transfer  In-House Referral:     Discharge planning Services  CM Consult  Post Acute Care Choice:    Choice offered to:     DME Arranged:    DME Agency:     HH Arranged:    HH Agency:     Status of Service:  Completed, signed off  If discussed at [employer] of Stay Meetings, dates discussed:    Additional Comments:  Lanier Clam, RN 12/04/2017, 12:15 PM

## 2017-12-04 NOTE — Progress Notes (Addendum)
TR BAND REMOVAL  LOCATION:   Right radial  DEFLATED PER PROTOCOL:    Yes.    TIME BAND OFF / DRESSING APPLIED:    1740p and a gauze applied , secured with tegaderm and coban   SITE UPON ARRIVAL:    Level 0  SITE AFTER BAND REMOVAL:    Level 0  CIRCULATION SENSATION AND MOVEMENT:    Within Normal Limits   Yes.    COMMENTS:   Pt given instruction on radial care.  Pt able to move all  Finger and radial pulses palable. Pt transferred back to [facility] via [facility]  Report called to [employer]

## 2017-12-04 NOTE — H&P (View-Only) (Signed)
 Cardiology Consultation:   Patient ID: Emily Maldonado; [id number]; 04/03/1976   Admit date: 12/03/2017 Date of Consult: 12/04/2017  Primary Care Provider: Parker, Caleb M, MD Primary Cardiologist: New to Dr. Analyn Matusek  Chief Complaint: chest pain   Patient Profile:   Emily Maldonado is a 41 y.o. female with a hx of morbid obesity with OSA, childhood asthma, HTN, hypothyroidism, former tobacco abuse who is being seen today for the evaluation of chest pain at the request of Dr. Nettey.  History of Present Illness:   [name] is originally from [place] but was living in [place] before relocating to [place] for a fresh start. She works as a nurse at [employer].  She was remotely followed by a cardiologist elsewhere and was told that she had early signs of right heart failure and was given instructions to quit smoking. She also has a history of PVCs per her report. She has sleep apnea and uses CPAP.  She smoked for over 20 years and quit in February.  She has had to use intermittent HCTZ for lower extremity edema.  She was in her usual state of health and visited [place] on Monday.  While there, developed a sensation of a substernal chest pain described as a charley horse.  It lasted about 2 to 3 minutes and resolved without intervention.  On Tuesday around 3:30 PM, she awoke in preparation to get ready for her night shift job.  She is not sure if the pain woke her up or she just woke up on her own anyway, but developed again a substernal pressure that migrated up her throat associated with shortness of breath and dizziness.  She tried to talk herself out of it as if it was an anxiety attack.  She got up to walk to the dresser and felt much worse.  This also lasted only about 2 to 3 minutes, and she was able to go on to work as usual.  However, ever since that time has continued to have intermittent recurrences of the above.  It also woke her up on occasion since that time.  Due to the  symptoms, she sought care at primary care and was sent to the emergency department.  She also feels as though she has been throwing PVCs.  One PVC is noted on her EKG.  Otherwise telemetry up on the floor has been unremarkable.  Troponins are negative x4.  She states she can feel when the chest discomfort is about to start.  She is currently chest pain-free but it has continued intermittently through this morning. LDL 92, K 3.4-3.8, LFTs OK, Cr wnl, CBC wnl, TSH 11.5, CXR NAD.  She believes her maternal grandmother had some sort of heart blockage, but details are not clear.  Otherwise there is a family history of heart failure and atrial fibrillation in her parents. Blood pressure elevated in the 170s systolic upon arrival, otherwise not tachycardic, tachypneic or hypoxic. Initial EKG showed TWI inferiorly and V3-V6 with no prior to compare to - TWI is less prominent by EKG today.  Past Medical History:  Diagnosis Date  . Childhood asthma   . Former tobacco use   . Hypertension   . Hypothyroidism   . Morbid obesity (HCC)   . Sleep apnea    Use C-pap     Past Surgical History:  Procedure Laterality Date  . BREAST BIOPSY Left 2014  . BREAST EXCISIONAL BIOPSY Right   . TUBAL LIGATION         Inpatient Medications: Scheduled Meds: . aspirin EC  81 mg Oral Daily  . enoxaparin (LOVENOX) injection  40 mg Subcutaneous Q24H  . levothyroxine  12.5 mcg Intravenous Once  . [START ON 12/05/2017] levothyroxine  50 mcg Intravenous QAC breakfast  . metoprolol succinate  25 mg Oral Daily   Continuous Infusions:  PRN Meds: acetaminophen, morphine injection, nitroGLYCERIN, ondansetron (ZOFRAN) IV  Home Meds: Prior to Admission medications   Medication Sig Start Date End Date Taking? Authorizing Provider  adapalene (DIFFERIN) 0.1 % cream Apply topically at bedtime. 08/22/17  Yes Parker, Caleb M, MD  aspirin-acetaminophen-caffeine (EXCEDRIN MIGRAINE) 250-250-65 MG tablet Take 2 tablets by mouth at  bedtime as needed for headache or migraine.   Yes [provider]  ibuprofen (ADVIL,MOTRIN) 200 MG tablet Take 600 mg by mouth daily as needed for headache.   Yes [provider]  levothyroxine (SYNTHROID, LEVOTHROID) 75 MCG tablet Take 1 tablet (75 mcg total) by mouth daily before breakfast. 08/22/17  Yes Parker, Caleb M, MD  Magnesium 500 MG CAPS Take 500 mg by mouth daily.   Yes [provider]  metoprolol succinate (TOPROL-XL) 25 MG 24 hr tablet Take 1 tablet (25 mg total) by mouth daily. 08/26/17  Yes Parker, Caleb M, MD  omeprazole (PRILOSEC) 20 MG capsule Take 20 mg by mouth daily.   Yes [provider]  terbinafine (LAMISIL) 250 MG tablet Take 1 tablet (250 mg total) by mouth daily. 08/15/17  Yes Wagoner, Matthew R, DPM  vitamin B-12 (CYANOCOBALAMIN) 1000 MCG tablet Take 5,000 mcg by mouth daily.   Yes [provider]    Allergies:    Allergies  Allergen Reactions  . Penicillins Anaphylaxis    Has patient had a PCN reaction causing immediate rash, facial/tongue/throat swelling, SOB or lightheadedness with hypotension: /No Has patient had a PCN reaction causing severe rash involving mucus membranes or skin necrosis: No Has patient had a PCN reaction that required hospitalization: No Has patient had a PCN reaction occurring within the last 10 years: No If all of the above answers are "NO", then may proceed with Cephalosporin use.   . Shellfish Allergy Anaphylaxis  . Zithromax [Azithromycin] Rash    Social History:   Social History   Socioeconomic History  . Marital status: Single    Spouse name: Not on file  . Number of children: Not on file  . Years of education: Not on file  . Highest education level: Not on file  Occupational History  . Not on file  Social Needs  . Financial resource strain: Not on file  . Food insecurity:    Worry: Not on file    Inability: Not on file  . Transportation needs:    Medical: Not on file     Non-medical: Not on file  Tobacco Use  . Smoking status: Former Smoker    Packs/day: 1.00    Types: Cigarettes    Last attempt to quit: 08/11/2017    Years since quitting: 0.3  . Smokeless tobacco: Never Used  Substance and Sexual Activity  . Alcohol use: Yes    Comment: Once a year  . Drug use: No  . Sexual activity: Not on file  Lifestyle  . Physical activity:    Days per week: Not on file    Minutes per session: Not on file  . Stress: Not on file  Relationships  . Social connections:    Talks on phone: Not on file    Gets together: Not   on file    Attends religious service: Not on file    Active member of club or organization: Not on file    Attends meetings of clubs or organizations: Not on file    Relationship status: Not on file  . Intimate partner violence:    Fear of current or ex partner: Not on file    Emotionally abused: Not on file    Physically abused: Not on file    Forced sexual activity: Not on file  Other Topics Concern  . Not on file  Social History Narrative  . Not on file    Family History:   The patient's family history includes Atrial fibrillation in her maternal grandmother and mother; Breast cancer (age of onset: 47) in her mother; Heart failure in her father, maternal grandmother, and mother; Hypertension in her father; Stroke in her maternal grandmother.  ROS:  Please see the history of present illness.  All other ROS reviewed and negative.     Physical Exam/Data:   Vitals:   12/03/17 1630 12/03/17 1752 12/03/17 2147 12/04/17 0539  BP: (!) 156/93  (!) 128/91 (!) 142/95  Pulse: 75  72 76  Resp: (!) 22 20 20 18  Temp:  98.5 F (36.9 C) 98.1 F (36.7 C) 98 F (36.7 C)  TempSrc:  Oral Oral Oral  SpO2: 95% 99% 97% 97%  Weight:  280 lb 8 oz (127.2 kg)    Height:        Intake/Output Summary (Last 24 hours) at 12/04/2017 0902 Last data filed at 12/04/2017 0800 Gross per 24 hour  Intake -  Output 300 ml  Net -300 ml   Filed Weights    12/03/17 1314 12/03/17 1752  Weight: 283 lb 12 oz (128.7 kg) 280 lb 8 oz (127.2 kg)   Body mass index is 48.15 kg/Maldonado.  General: Well developed, well nourished morbidly obese WF in no acute distress. Head: Normocephalic, atraumatic, sclera non-icteric, no xanthomas, nares are without discharge Neck: Negative for carotid bruits. JVD not elevated. Lungs: Clear bilaterally to auscultation without wheezes, rales, or rhonchi. Breathing is unlabored. Heart: RRR with S1 S2. No murmurs, rubs, or gallops appreciated. Abdomen: Soft, non-tender, non-distended with normoactive bowel sounds. No hepatomegaly. No rebound/guarding. No obvious abdominal masses. Msk:  Strength and tone appear normal for age. Extremities: No clubbing or cyanosis. No edema.  Distal pedal pulses are 2+ and equal bilaterally. Neuro: Alert and oriented X 3. No facial asymmetry. No focal deficit. Moves all extremities spontaneously. Psych:  Responds to questions appropriately with a normal affect.  EKG:  The EKG was personally reviewed and demonstrates NSR with slight ST depression,TWI inferiorly and V3-V6.  Relevant CV Studies: none  Laboratory Data:  Chemistry Recent Labs  Lab 12/03/17 1400 12/03/17 1453 12/04/17 0539  NA 140 142 140  K 3.5 3.4* 3.8  CL 105 104 107  CO2 25  --  24  GLUCOSE 94 91 101*  BUN 11 8 13  CREATININE 0.67 0.60 0.65  CALCIUM 8.6*  --  8.5*  GFRNONAA >60  --  >60  GFRAA >60  --  >60  ANIONGAP 10  --  9    Recent Labs  Lab 12/03/17 1400  PROT 7.3  ALBUMIN 3.9  AST 20  ALT 21  ALKPHOS 74  BILITOT 0.4   Hematology Recent Labs  Lab 12/03/17 1400 12/03/17 1453 12/04/17 0539  WBC 6.4  --  5.9  RBC 4.69  --  4.72  HGB 13.5   12.2 13.3  HCT 40.1 36.0 40.7  MCV 85.5  --  86.2  MCH 28.8  --  28.2  MCHC 33.7  --  32.7  RDW 13.3  --  13.3  PLT 304  --  282   Cardiac Enzymes Recent Labs  Lab 12/03/17 1806 12/03/17 2353 12/04/17 0539  TROPONINI <0.03 <0.03 <0.03    Recent  Labs  Lab 12/03/17 1404  TROPIPOC 0.00    BNPNo results for input(s): BNP, PROBNP in the last 168 hours.  DDimer No results for input(s): DDIMER in the last 168 hours.  Radiology/Studies:  Dg Chest 2 View  Result Date: 12/03/2017 CLINICAL DATA:  Generalized chest pain with shortness of breath. Symptoms started last night. History of hypertension. Ex-smoker. EXAM: CHEST - 2 VIEW COMPARISON:  None. FINDINGS: The heart size and mediastinal contours are normal. The lungs are clear. There is no pleural effusion or pneumothorax. No acute osseous findings are identified. Telemetry leads overlie the chest. Acromioclavicular degenerative changes are present bilaterally. IMPRESSION: No active cardiopulmonary process. Electronically Signed   By: William  Veazey Maldonado.D.   On: 12/03/2017 13:41    Assessment and Plan:   1. Chest pain/dyspnea/dizziness with mixed atypical/typical features - negative troponins but EKG is abnormal and concerning for cardiac ischemia. Risk factors include obesity, HTN, 20+ years of tobacco abuse and family history of heart disease. She received 324mg ASA on arrival. Noninvasive testing would be extremely challenging with limited sensitivity given her BMI of 48. Discussed with Dr. Ian Castagna - at this time we would recommend definitive evaluation with cardiac cath over at [facility]. Risks and benefits of cardiac catheterization have been discussed with the patient.  These include bleeding, infection, kidney damage, stroke, heart attack, death.  The patient understands these risks and is willing to proceed. I spoke with cath lab and they added her to our add-on board. Pre-cath orders written. Await cath. Hold off heparin given negative troponins and currently chest pain free.  2. Essential HTN - BP labile, elevated at times. May need additional therapy.  3. Occasional PVCs (has history of such) - seen on EKG but not with any significant frequency on telemetry.  4. Hypokalemia -  repleted by IM.  5. Morbid obesity with OSA - compliant with CPAP. She is aware of need to lose weight.  For questions or updates, please contact [facility] Please consult [email or web address] for contact info under Cardiology/STEMI.   Signed, Dayna N Dunn, PA-C  12/04/2017 9:02 AM  The patient was seen, examined and discussed with Dayna Dunn, PA-C and I agree with the above.   41 y.o. female with a hx of morbid obesity with OSA, childhood asthma, HTN, hypothyroidism, former tobacco abuse (quit in Feb 2019 after 20 years), who is being seen today for the evaluation of chest pain. 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 She denies any DOE, LE edema, orthopnea. She has developed chest pressures at night two days ago, it has progressed to pressure at her neck and arm.  Troponin negative x 3, ECG shows SR and negative T waves in the inferior and anterior leads. Her risk factors include long term smoking, FH of premature CAD, sleep apnea, HTN, morbid obesity. Given morbid obesity (BMI 48) and high pretest probability for CAD we will proceed with a cardiac catheterization instead of nuclear stress testing or coronary CTA.  Leonda Cristo, MD 12/04/2017   

## 2017-12-04 NOTE — CV Procedure (Signed)
2D echo attempted but [facility] is [facility] in the cath lab

## 2017-12-05 ENCOUNTER — Encounter (HOSPITAL_COMMUNITY): Payer: Self-pay | Admitting: Cardiovascular Disease

## 2017-12-05 ENCOUNTER — Observation Stay (HOSPITAL_COMMUNITY): Payer: BLUE CROSS/BLUE SHIELD

## 2017-12-05 ENCOUNTER — Observation Stay (HOSPITAL_BASED_OUTPATIENT_CLINIC_OR_DEPARTMENT_OTHER): Payer: BLUE CROSS/BLUE SHIELD

## 2017-12-05 DIAGNOSIS — E876 Hypokalemia: Secondary | ICD-10-CM | POA: Diagnosis not present

## 2017-12-05 DIAGNOSIS — E039 Hypothyroidism, unspecified: Secondary | ICD-10-CM | POA: Diagnosis not present

## 2017-12-05 DIAGNOSIS — R072 Precordial pain: Secondary | ICD-10-CM

## 2017-12-05 DIAGNOSIS — R0789 Other chest pain: Secondary | ICD-10-CM | POA: Diagnosis not present

## 2017-12-05 DIAGNOSIS — R079 Chest pain, unspecified: Secondary | ICD-10-CM

## 2017-12-05 DIAGNOSIS — E538 Deficiency of other specified B group vitamins: Secondary | ICD-10-CM

## 2017-12-05 DIAGNOSIS — I10 Essential (primary) hypertension: Secondary | ICD-10-CM | POA: Diagnosis not present

## 2017-12-05 LAB — BASIC METABOLIC PANEL
Anion gap: 5 (ref 5–15)
BUN: 13 mg/dL (ref 6–20)
CALCIUM: 8.5 mg/dL — AB (ref 8.9–10.3)
CO2: 28 mmol/L (ref 22–32)
CREATININE: 0.62 mg/dL (ref 0.44–1.00)
Chloride: 108 mmol/L (ref 101–111)
GFR calc Af Amer: >60 mL/min (ref >60)
GLUCOSE: 99 mg/dL (ref 65–99)
Potassium: 3.8 mmol/L (ref 3.5–5.1)
SODIUM: 141 mmol/L (ref 135–145)

## 2017-12-05 LAB — CBC
HCT: 38.9 % (ref 36.0–46.0)
Hemoglobin: 12.7 g/dL (ref 12.0–15.0)
MCH: 28.1 pg (ref 26.0–34.0)
MCHC: 32.6 g/dL (ref 30.0–36.0)
MCV: 86.1 fL (ref 78.0–100.0)
PLATELETS: 260 10*3/uL (ref 150–400)
RBC: 4.52 MIL/uL (ref 3.87–5.11)
RDW: 13.2 % (ref 11.5–15.5)
WBC: 6.6 10*3/uL (ref 4.0–10.5)

## 2017-12-05 LAB — ECHOCARDIOGRAM COMPLETE
Height: 64 in
WEIGHTICAEL: 4521.6 [oz_av]

## 2017-12-05 LAB — VITAMIN B12: Vitamin B-12: 184 pg/mL (ref 180–914)

## 2017-12-05 LAB — MAGNESIUM: Magnesium: 1.7 mg/dL (ref 1.7–2.4)

## 2017-12-05 MED ORDER — MAGNESIUM SULFATE 4 GM/100ML IV SOLN
4.0000 g | Freq: Once | INTRAVENOUS | Status: AC
  Administered 2017-12-05: 4 g via INTRAVENOUS
  Filled 2017-12-05: qty 100

## 2017-12-05 MED ORDER — TRAMADOL HCL 50 MG PO TABS: 50.0000 mg | ORAL_TABLET | Freq: Four times a day (QID) | ORAL | 0 refills | Status: DC | PRN

## 2017-12-05 MED ORDER — LEVOTHYROXINE SODIUM 100 MCG PO TABS: 100.0000 ug | ORAL_TABLET | Freq: Every day | ORAL | 0 refills | Status: DC

## 2017-12-05 MED ORDER — LEVOTHYROXINE SODIUM 75 MCG PO TABS: 75.0000 ug | ORAL_TABLET | ORAL | Status: DC

## 2017-12-05 MED ORDER — IOPAMIDOL (ISOVUE-370) INJECTION 76%
INTRAVENOUS | Status: AC
  Administered 2017-12-05: 08:00:00
  Filled 2017-12-05: qty 100

## 2017-12-05 MED ORDER — IOPAMIDOL (ISOVUE-370) INJECTION 76%
100.0000 mL | Freq: Once | INTRAVENOUS | Status: AC | PRN
  Administered 2017-12-05: 100 mL via INTRAVENOUS

## 2017-12-05 MED ORDER — CYANOCOBALAMIN 1000 MCG/ML IJ SOLN: 1000.0000 ug | Freq: Every day | INTRAMUSCULAR | 0 refills | Status: DC

## 2017-12-05 MED ORDER — CYANOCOBALAMIN 1000 MCG/ML IJ SOLN
1000.0000 ug | Freq: Every day | INTRAMUSCULAR | Status: DC
  Administered 2017-12-05: 1000 ug via INTRAMUSCULAR
  Filled 2017-12-05: qty 1

## 2017-12-05 MED ORDER — OMEPRAZOLE 40 MG PO CPDR: 40.0000 mg | DELAYED_RELEASE_CAPSULE | Freq: Every day | ORAL | 0 refills | Status: DC

## 2017-12-05 MED ORDER — "SYRINGE 22G X 1-1/2"" 3 ML MISC": 1000.0000 ug | Freq: Every day | 0 refills | Status: DC

## 2017-12-05 MED FILL — Heparin Sod (Porcine)-NaCl IV Soln 1000 Unit/500ML-0.9%: INTRAVENOUS | Qty: 1000 | Status: AC

## 2017-12-05 NOTE — Progress Notes (Signed)
Normal cath, consider non-cardiac causes of chest pain. We will sign off.  Tobias Alexander, MD 12/05/2017

## 2017-12-05 NOTE — Discharge Summary (Signed)
Physician Discharge Summary  Emily Maldonado WJX:[id number] DOB: [birth year] DOA: 12/03/2017  PCP: Ardith Dark, MD  Admit date: 12/03/2017 Discharge date: 12/05/2017  Time spent: 60 minutes  Recommendations for Outpatient Follow-up:  1. Follow-up with Ardith Dark, MD in 2 weeks.  On follow-up patient's chest pain will need to be reassessed and further evaluation needs to be done if patient with continued chest pain.  Patient will need repeat thyroid function studies done in about 4 to 6 weeks as patient's TSH was noted to be elevated during this hospitalization and Synthroid dose increased to 100 MCG's daily.  Patient will also need vitamin B12 levels checked as patient was placed on vitamin B12 IM injections on discharge.   Discharge Diagnoses:  Principal Problem:   Chest pain Active Problems:   Hypothyroidism   Hypertension   Hypokalemia   PVC's (premature ventricular contractions)   Morbid obesity (HCC)   Low vitamin B12 level   Discharge Condition: Stable and improved  Diet recommendation: Regular  Filed Weights   12/03/17 1314 12/03/17 1752 12/04/17 1045  Weight: 128.7 kg (283 lb 12 oz) 127.2 kg (280 lb 8 oz) 128.2 kg (282 lb 9.6 oz)    History of present illness:  Per Dr.Nettey Charrisse Emily Maldonado is a 42 y.o. female with medical history significant of hypothyroidism, essential hypertension.  2 days prior to admission, patient had an episode of substernal chest pain when she awoke that morning that lasted a few seconds.  She had 2 episodes.    1 day prior to admission, she had recurrence of her chest tightness with associated lightheadedness upon standing x2 episodes.  While she has these episodes, she had associated dyspnea.  She was working overnight the day prior to admission, and had recurrent intermittent chest discomfort.  The morning of admission, she went to her primary care physician and an EKG was obtained which showed some T wave inversions in anterior leads.   Cardiology was consulted as an outpatient and recommended patient be managed in the hospital for ACS rule out.   chest pain has responded to nitroglycerin and Ativan while in the ED.  ED Course: Vitals: Afebrile, normal pulse, normal respirations, normotensive, on room air. Labs: Potassium 3.4, troponin of 0.0 Imaging: Chest x-ray unremarkable Medications/Course: Sublingual nitroglycerin x3, Ativan   Hospital Course:  1 chest pain Patient had presented with chest pain, EKG changes, history of tobacco use, family history of coronary artery disease.  Cardiac enzymes were cycled which were negative x3.  Patient was seen in consultation with cardiology and subsequently underwent cardiac catheterization on 12/04/2017 with findings negative for ACS.  Patient's TSH noted to be elevated at 11.581.  Patient endorsed compliance with her medication.  2D echo was done with a EF of 55 to 60% with no wall motion abnormalities.  Left ventricular diastolic function parameters were normal.  Moderately dilated left atrium. Patient still with complaints of chest pressure which she states radiating to her\left scapular region.  Denied any significant shortness of breath.  Patient did state had a recent long car ride to [place] and back.  Patient was given one-time dose of full dose Lovenox while awaiting CT angiogram of chest which was done on 12/05/2017 which was negative for PE.  Patient was also placed on Protonix 40 mg daily.  Patient stated had minimal improvement with the chest pain.  Patient was seen in consultation by cardiology who felt no further ischemic work-up needed at this time.  Patient will be discharged  home on Ultram as needed and will need to follow-up with PCP in the outpatient setting.   2.  Hypothyroidism TSH elevated at 11.581.  Magnesium level was checked which was 1.7.  Vitamin B12 levels were checked which were low at 184.  Patient's Synthroid dose was increased to 100 MCG's orally daily.   Patient was placed on vitamin B12 IM injections.  Patient will need thyroid function studies done in 4 to 6 weeks.  Outpatient follow-up with PCP.   3.  Hypertension Blood pressure remained stable on home dose of Toprol-XL.    4.  Hypokalemia Repleted.  5.  Morbid obesity with OSA CPAP nightly.  6.  Occasional PVCs Magnesium Level was checked which was 1.7.  Patient was given a dose of IV magnesium on day of discharge.  Patient's TSH was noted to be elevated at 11.581 and patient's Synthroid dose has been increased to 100 MCG's daily.  Patient was seen by cardiology underwent a cardiac cath which was clean.  Patient was discharged home in stable condition.  Outpatient follow-up with PCP.    7.  Recent exposure to HIV Per PCPs notes HIV test pending.  HIV antibody test nonreactive.  8 low vitamin B12 levels Vitamin B12 levels were checked which came back at 184.  Patient stated she was taking oral vitamin B12 tablets 5000 MCG's daily.  Patient was placed on vitamin B12 1000 MCG's IM daily during the hospitalization and will be discharged on vitamin B12 1000 MCG's IM daily x6 days, and then weekly x1 month, and then monthly.  Patient will need outpatient follow-up with PCP with repeat labs.     Procedures:  Cardiac catheterization 12/04/2017 per Dr. Clifton James  Chest x-ray 12/03/2017  2D echo 12/05/2017  CT angiogram chest 12/05/2017      Consultations:  Cardiology: Dr. Delton See 12/04/2017  Discharge Exam: Vitals:   12/05/17 0525 12/05/17 1504  BP: 113/67 (!) 165/98  Pulse: 67 72  Resp: (!) 21 16  Temp: 97.7 F (36.5 C)   SpO2: 95% 96%    General: NAD Cardiovascular: RRR Respiratory: CTAB  Discharge Instructions   Discharge Instructions    Diet general   Complete by:  As directed    Increase activity slowly   Complete by:  As directed      Allergies as of 12/05/2017      Reactions   Penicillins Anaphylaxis   Has patient had a PCN reaction causing  immediate rash, facial/tongue/throat swelling, SOB or lightheadedness with hypotension: /No Has patient had a PCN reaction causing severe rash involving mucus membranes or skin necrosis: No Has patient had a PCN reaction that required hospitalization: No Has patient had a PCN reaction occurring within the last 10 years: No If all of the above answers are "NO", then may proceed with Cephalosporin use.   Shellfish Allergy Anaphylaxis   Zithromax [azithromycin] Rash      Medication List    STOP taking these medications   vitamin B-12 1000 MCG tablet Commonly known as:  CYANOCOBALAMIN Replaced by:  cyanocobalamin 1000 MCG/ML injection     TAKE these medications   adapalene 0.1 % cream Commonly known as:  DIFFERIN Apply topically at bedtime.   aspirin-acetaminophen-caffeine 250-250-65 MG tablet Commonly known as:  EXCEDRIN MIGRAINE Take 2 tablets by mouth at bedtime as needed for headache or migraine.   cyanocobalamin 1000 MCG/ML injection Commonly known as:  (VITAMIN B-12) Inject 1 mL (1,000 mcg total) into the muscle daily. IM Daily x  6 days, then 1000mcg IM weekly x 1 month, then 1000mcg monthly Start taking on:  12/06/2017 Replaces:  vitamin B-12 1000 MCG tablet   ibuprofen 200 MG tablet Commonly known as:  ADVIL,MOTRIN Take 600 mg by mouth daily as needed for headache.   levothyroxine 100 MCG tablet Commonly known as:  SYNTHROID, LEVOTHROID Take 1 tablet (100 mcg total) by mouth daily before breakfast. What changed:    medication strength  how much to take   Magnesium 500 MG Caps Take 500 mg by mouth daily.   metoprolol succinate 25 MG 24 hr tablet Commonly known as:  TOPROL-XL Take 1 tablet (25 mg total) by mouth daily.   omeprazole 40 MG capsule Commonly known as:  PRILOSEC Take 1 capsule (40 mg total) by mouth daily. What changed:    medication strength  how much to take   SYRINGE 3CC/22GX1-1/2" 22G X 1-1/2" 3 ML Misc 1,000 mcg by Does not apply  route daily.   terbinafine 250 MG tablet Commonly known as:  LAMISIL Take 1 tablet (250 mg total) by mouth daily.   traMADol 50 MG tablet Commonly known as:  ULTRAM Take 1-2 tablets (50-100 mg total) by mouth every 6 (six) hours as needed.      Allergies  Allergen Reactions  . Penicillins Anaphylaxis    Has patient had a PCN reaction causing immediate rash, facial/tongue/throat swelling, SOB or lightheadedness with hypotension: /No Has patient had a PCN reaction causing severe rash involving mucus membranes or skin necrosis: No Has patient had a PCN reaction that required hospitalization: No Has patient had a PCN reaction occurring within the last 10 years: No If all of the above answers are "NO", then may proceed with Cephalosporin use.   . Shellfish Allergy Anaphylaxis  . Zithromax [Azithromycin] Rash   Follow-up Information    Ardith DarkParker, Caleb M, MD. Schedule an appointment as soon as possible for a visit in 2 week(s).   Specialty:  Family Medicine Contact information: [address] [place] [address] [phone]            The results of significant diagnostics from this hospitalization (including imaging, microbiology, ancillary and laboratory) are listed below for reference.    Significant Diagnostic Studies: Dg Chest 2 View  Result Date: 12/03/2017 CLINICAL DATA:  Generalized chest pain with shortness of breath. Symptoms started last night. History of hypertension. Ex-smoker. EXAM: CHEST - 2 VIEW COMPARISON:  None. FINDINGS: The heart size and mediastinal contours are normal. The lungs are clear. There is no pleural effusion or pneumothorax. No acute osseous findings are identified. Telemetry leads overlie the chest. Acromioclavicular degenerative changes are present bilaterally. IMPRESSION: No active cardiopulmonary process. Electronically Signed   By: Carey BullocksWilliam  Veazey M.D.   On: 12/03/2017 13:41   Ct Angio Chest Pe W Or Wo Contrast  Result Date:  12/05/2017 CLINICAL DATA:  Chest pain EXAM: CT ANGIOGRAPHY CHEST WITH CONTRAST TECHNIQUE: Multidetector CT imaging of the chest was performed using the standard protocol during bolus administration of intravenous contrast. Multiplanar CT image reconstructions and MIPs were obtained to evaluate the vascular anatomy. CONTRAST:  100mL ISOVUE-370 IOPAMIDOL (ISOVUE-370) INJECTION 76% COMPARISON:  Chest radiograph Dec 03, 2017 FINDINGS: Cardiovascular: There is no demonstrable pulmonary embolus. There is no thoracic aortic aneurysm or dissection. The contrast bolus in the aorta is less than optimal for assessment for potential dissection. Visualized great vessels appear unremarkable. There is no appreciable pericardial effusion or pericardial thickening. Mediastinum/Nodes: Thyroid appears unremarkable. There are multiple subcentimeter lymph  nodes throughout each axillary region. There are also subcentimeter mediastinal lymph nodes. There is no demonstrable adenopathy by size criteria. No esophageal lesions are appreciable. Lungs/Pleura: There are scattered areas of mild atelectatic change. There is no edema or consolidation. No pleural effusion or pleural thickening evident. On axial slice 78 series 6, there is a 5 mm nodular opacity abutting the pleura in the inferior lingula. On axial slice 66 series 6, there is a 4 mm nodular opacity in the medial segment right middle lobe. Upper Abdomen: In the visualized upper abdomen, there is mild reflux of contrast into the inferior vena cava and hepatic veins. Visualized upper abdominal structures otherwise appear unremarkable. Musculoskeletal: There are no blastic or lytic bone lesions. No chest wall lesions are evident. There is postoperative change in the right breast region. Review of the MIP images confirms the above findings. IMPRESSION: 1. No demonstrable pulmonary embolus. No thoracic aortic aneurysm or dissection. Note that the contrast bolus in the aorta is less than  optimal for assessment for potential dissection. 2. No lung edema or consolidation. Areas of mild atelectatic change. Nodular opacities noted as summarized above, largest measuring 5 mm. No follow-up needed if patient is low-risk (and has no known or suspected primary neoplasm). Non-contrast chest CT can be considered in 12 months if patient is high-risk. This recommendation follows the consensus statement: Guidelines for Management of Incidental Pulmonary Nodules Detected on CT Images: From the Fleischner Society 2017; Radiology 2017; 284:228-243. 3. Multiple subcentimeter lymph nodes in the axillary regions bilaterally. No adenopathy in the thoracic region by size criteria. 4. Mild reflux of contrast into the inferior vena cava and hepatic veins, a finding that may be indicative of a degree of increase in right heart pressure. 5.  Postoperative change in right breast. Electronically Signed   By: Bretta Bang III M.D.   On: 12/05/2017 08:19    Microbiology: No results found for this or any previous visit (from the past 240 hour(s)).   Labs: Basic Metabolic Panel: Recent Labs  Lab 12/03/17 1400 12/03/17 1453 12/04/17 0539 12/05/17 0555  NA 140 142 140 141  K 3.5 3.4* 3.8 3.8  CL 105 104 107 108  CO2 25  --  24 28  GLUCOSE 94 91 101* 99  BUN 11 8 13 13   CREATININE 0.67 0.60 0.65 0.62  CALCIUM 8.6*  --  8.5* 8.5*  MG 1.8  --   --  1.7   Liver Function Tests: Recent Labs  Lab 12/03/17 1400  AST 20  ALT 21  ALKPHOS 74  BILITOT 0.4  PROT 7.3  ALBUMIN 3.9   No results for input(s): LIPASE, AMYLASE in the last 168 hours. No results for input(s): AMMONIA in the last 168 hours. CBC: Recent Labs  Lab 12/03/17 1400 12/03/17 1453 12/04/17 0539 12/05/17 0555  WBC 6.4  --  5.9 6.6  NEUTROABS 3.6  --   --   --   HGB 13.5 12.2 13.3 12.7  HCT 40.1 36.0 40.7 38.9  MCV 85.5  --  86.2 86.1  PLT 304  --  282 260   Cardiac Enzymes: Recent Labs  Lab 12/03/17 1806 12/03/17 2353  12/04/17 0539  TROPONINI <0.03 <0.03 <0.03   BNP: BNP (last 3 results) No results for input(s): BNP in the last 8760 hours.  ProBNP (last 3 results) No results for input(s): PROBNP in the last 8760 hours.  CBG: No results for input(s): GLUCAP in the last 168 hours.  Signed:  Ramiro Harvest MD.  [facility] 12/05/2017, 3:31 PM

## 2017-12-05 NOTE — Progress Notes (Signed)
  Echocardiogram 2D Echocardiogram has been performed.  Emily Maldonado L Androw 12/05/2017, 2:24 PM

## 2017-12-05 NOTE — Progress Notes (Signed)
Pharmacy IV to PO conversion  The patient is receiving Synthroid by the intravenous route.  Based on criteria approved by the Pharmacy and Therapeutics Committee and the Medical Executive Committee, the medication is being converted to the equivalent oral dose form.   No active GI bleeding or impaired absorption  Not s/p esophagectomy  Documented ability to take oral medications for > 24 hr  Plan to continue treatment for at least 1 day  If you have any questions about this conversion, please contact the Pharmacy Department (ext [phone]).  Thank you.  Bernadene Personrew Aydan Phoenix, PharmD, BCPS [phone] 12/05/2017, 1:48 PM

## 2017-12-08 ENCOUNTER — Telehealth: Payer: Self-pay | Admitting: *Deleted

## 2017-12-08 NOTE — Telephone Encounter (Signed)
Per chart review: Admit date: 12/03/2017 Discharge date: 12/05/2017  Time spent: 60 minutes  Recommendations for Outpatient Follow-up:  1. Follow-up with Ardith DarkParker, Caleb M, MD in 2 weeks.  On follow-up patient's chest pain will need to be reassessed and further evaluation needs to be done if patient with continued chest pain.  Patient will need repeat thyroid function studies done in about 4 to 6 weeks as patient's TSH was noted to be elevated during this hospitalization and Synthroid dose increased to 100 MCG's daily.  Patient will also need vitamin B12 levels checked as patient was placed on vitamin B12 IM injections on discharge.   Discharge Diagnoses:  Principal Problem:   Chest pain Active Problems:   Hypothyroidism   Hypertension   Hypokalemia   PVC's (premature ventricular contractions)   Morbid obesity (HCC)   Low vitamin B12 level   Discharge Condition: Stable and improved  Diet recommendation: Regular ____________________________________________________________________ Per telephone call: Transition Care Management Follow-up Telephone Call   Date discharged? 12/05/17   How have you been since you were released from the hospital? "doing ok, having a lot of anxiety issues. My thyroid levels were up."    Do you understand why you were in the hospital? yes   Do you understand the discharge instructions? yes   Where were you discharged to? Home   Items Reviewed:  Medications reviewed: yes  Allergies reviewed: yes  Dietary changes reviewed: yes  Referrals reviewed: yes   Functional Questionnaire:   Activities of Daily Living (ADLs):   She states they are independent in the following: ambulation, bathing and hygiene, feeding, continence, grooming, toileting and dressing States they require assistance with the following: none   Any transportation issues/concerns?: no   Any patient concerns? Yes. Patient concerned about her thyroid levels, unsure  of how they got so high.   Confirmed importance and date/time of follow-up visits scheduled yes  Provider Appointment booked with Dr Jimmey RalphParker 12/19/17.   Confirmed with patient if condition begins to worsen call PCP or go to the ER.  Patient was given the office number and encouraged to call back with question or concerns.  : yes

## 2017-12-19 ENCOUNTER — Encounter: Payer: Self-pay | Admitting: Family Medicine

## 2017-12-19 ENCOUNTER — Ambulatory Visit (INDEPENDENT_AMBULATORY_CARE_PROVIDER_SITE_OTHER): Payer: BLUE CROSS/BLUE SHIELD | Admitting: Family Medicine

## 2017-12-19 VITALS — BP 120/72 | HR 74 | Temp 97.9°F | Ht 64.0 in | Wt 282.4 lb

## 2017-12-19 DIAGNOSIS — I493 Ventricular premature depolarization: Secondary | ICD-10-CM

## 2017-12-19 DIAGNOSIS — E538 Deficiency of other specified B group vitamins: Secondary | ICD-10-CM | POA: Diagnosis not present

## 2017-12-19 DIAGNOSIS — R911 Solitary pulmonary nodule: Secondary | ICD-10-CM | POA: Diagnosis not present

## 2017-12-19 DIAGNOSIS — E039 Hypothyroidism, unspecified: Secondary | ICD-10-CM | POA: Diagnosis not present

## 2017-12-19 NOTE — Progress Notes (Signed)
Subjective:  Emily Maldonado is a 42 y.o. female who presents today for a TCM visit.  HPI:  Summary of Hospital admission: Reason for admission: Chest Pain Date of admission: 12/03/2017 Date of discharge: 12/05/2017 Date of Interactive contact: 12/08/2017 Summary of Hospital course: Patient presented to the office on 12/03/2016 with chest pain shortness of breath.  EKG in office showed new lateral T wave inversions.  Cardiology was consulted and recommended admission for ACS work-up.  Patient was sent to the emergency department for admission.  During her admission, she had cardiac work-up including catheterization which was normal.  She also had an echocardiogram which was normal.  Patient was noted to have elevated TSH to 11.58.  As well as low vitamin B12 and low magnesium levels.  Patient's Synthroid was increased to 100 mcg daily.  She was also started on B12 injections.  Interim History:   Chest pain/PVCs As noted above, patient had a negative cardiac work-up during Her admission.  While admitted, she noted that she would get that chest pains while having PVCs that she observed on the monitor.  She thinks that this is the main source of her symptoms.  She is still having occasional PVCs but overall the frequency and severity have significantly improved.  Hypothyroidism As noted above, pt increased her dose of synthroid to during her admission.  She still feels like her thyroid levels are a little bit low, but has noticed significant improvement since her hospitalization.  She has tolerated her increased dose without side effects.  She thinks that her thyroid level may have gotten off due to taking Lamisil for onychomycosis.  She has not had any other medication changes.  Has not had any manufacturer changes either.  She has not changed her dosing schedule.  B12 deficiency Patient is self administering weekly IM injections.  She is tolerating well.  Feels like it is helping with  her symptoms.  Pulmonary nodules Incidentally found on patient's CTA.  She has a smoking history.  ROS: Ecchymosis on abdomen from Lovenox injections, otherwise a complete review of systems was negative.   PMH:  The following were reviewed and entered/updated in epic: Past Medical History:  Diagnosis Date  . Childhood asthma   . Former tobacco use   . Hypertension   . Hypothyroidism   . Morbid obesity (HCC)   . Sleep apnea    Use C-pap    Patient Active Problem List   Diagnosis Date Noted  . Pulmonary nodule - needs repeat 12/2018 12/19/2017  . Low vitamin B12 level 12/05/2017  . PVC's (premature ventricular contractions)   . Morbid obesity (HCC)   . Acne 08/22/2017  . Hypothyroidism 08/22/2017  . Hypertension 08/22/2017   Past Surgical History:  Procedure Laterality Date  . BREAST BIOPSY Left 2014  . BREAST EXCISIONAL BIOPSY Right   . LEFT HEART CATH AND CORONARY ANGIOGRAPHY N/A 12/04/2017   Procedure: LEFT HEART CATH AND CORONARY ANGIOGRAPHY;  Surgeon: Kathleene Hazel, MD;  Location: [facility];  Service: Cardiovascular;  Laterality: N/A;  . TUBAL LIGATION      Family History  Problem Relation Age of Onset  . Breast cancer Mother 66  . Heart failure Mother   . Atrial fibrillation Mother   . Heart failure Father   . Hypertension Father   . Heart failure Maternal Grandmother   . Atrial fibrillation Maternal Grandmother   . Stroke Maternal Grandmother     Medications- Reconciled discharge and current  medications in Epic.  Current Outpatient Medications  Medication Sig Dispense Refill  . adapalene (DIFFERIN) 0.1 % cream Apply topically at bedtime. 45 g 0  . aspirin-acetaminophen-caffeine (EXCEDRIN MIGRAINE) 250-250-65 MG tablet Take 2 tablets by mouth at bedtime as needed for headache or migraine.    . cyanocobalamin (,VITAMIN B-12,) 1000 MCG/ML injection Inject 1 mL (1,000 mcg total) into the muscle daily. IM Daily x 6 days, then IM  weekly x 1 month, then monthly 25 mL 0  . ibuprofen (ADVIL,MOTRIN) 200 MG tablet Take 600 mg by mouth daily as needed for headache.    . levothyroxine (SYNTHROID, LEVOTHROID) 100 MCG tablet Take 1 tablet (100 mcg total) by mouth daily before breakfast. 30 tablet 0  . Magnesium 500 MG CAPS Take 500 mg by mouth daily.    . metoprolol succinate (TOPROL-XL) 25 MG 24 hr tablet Take 1 tablet (25 mg total) by mouth daily. 90 tablet 3  . omeprazole (PRILOSEC) 40 MG capsule Take 1 capsule (40 mg total) by mouth daily. 30 capsule 0  . Syringe/Needle, Disp, (SYRINGE 3CC/22GX1-1/2") 22G X 1-1/2" 3 ML MISC 1,000 mcg by Does not apply route daily. 50 each 0  . traMADol (ULTRAM) 50 MG tablet Take 1-2 tablets (50-100 mg total) by mouth every 6 (six) hours as needed. 20 tablet 0   No current facility-administered medications for this visit.     Allergies-reviewed and updated Allergies  Allergen Reactions  . Penicillins Anaphylaxis    Has patient had a PCN reaction causing immediate rash, facial/tongue/throat swelling, SOB or lightheadedness with hypotension: /No Has patient had a PCN reaction causing severe rash involving mucus membranes or skin necrosis: No Has patient had a PCN reaction that required hospitalization: No Has patient had a PCN reaction occurring within the last 10 years: No If all of the above answers are "NO", then may proceed with Cephalosporin use.   . Shellfish Allergy Anaphylaxis  . Zithromax [Azithromycin] Rash    Social History   Socioeconomic History  . Marital status: Single    Spouse name: Not on file  . Number of children: Not on file  . Years of education: Not on file  . Highest education level: Not on file  Occupational History  . Not on file  Social Needs  . Financial resource strain: Not on file  . Food insecurity:    Worry: Not on file    Inability: Not on file  . Transportation needs:    Medical: Not on file    Non-medical: Not on file  Tobacco Use   . Smoking status: Former Smoker    Packs/day: 1.00    Types: Cigarettes    Last attempt to quit: 08/11/2017    Years since quitting: 0.3  . Smokeless tobacco: Never Used  Substance and Sexual Activity  . Alcohol use: Yes    Comment: Once a year  . Drug use: No  . Sexual activity: Not on file  Lifestyle  . Physical activity:    Days per week: Not on file    Minutes per session: Not on file  . Stress: Not on file  Relationships  . Social connections:    Talks on phone: Not on file    Gets together: Not on file    Attends religious service: Not on file    Active member of club or organization: Not on file    Attends meetings of clubs or organizations: Not on file    Relationship status:  Not on file  Other Topics Concern  . Not on file  Social History Narrative  . Not on file    Objective:  Physical Exam: BP 120/72 (BP Location: Left Arm, Patient Position: Sitting, Cuff Size: Large)   Pulse 74   Temp 97.9 F (36.6 C) (Oral)   Ht 5\' 4"  (1.626 m)   Wt 282 lb 6.4 oz (128.1 kg)   LMP 11/27/2017   SpO2 98%   BMI 48.47 kg/m   Wt Readings from Last 3 Encounters:  12/19/17 282 lb 6.4 oz (128.1 kg)  12/04/17 282 lb 9.6 oz (128.2 kg)  12/03/17 283 lb 12.8 oz (128.7 kg)  Gen: NAD, resting comfortably CV: RRR with no murmurs appreciated Pulm: NWOB, CTAB with no crackles, wheezes, or rhonchi GI: Normal bowel sounds present. Soft, Nontender, Nondistended. MSK: No edema, cyanosis, or clubbing noted Skin: Warm, dry Neuro: Grossly normal, moves all extremities Psych: Normal affect and thought content  Summary/Review of work up during hospitalization: TSH 12/03/2017: 11.58 B12 12/05/2017: 184 Chest CTA 12/05/2017: No PE.  Nodular opacities in the inferior lingula and right middle lobe. Echocardiogram 12/05/2016: EF 55 to 60%.  Moderate left atrial dilation.  Assessment/Plan:  PVC's (premature ventricular contractions) Not apparent on today's exam.  Symptoms seem to be improving.   We will continue her magnesium supplement.  Hopefully will have continued improvement as her TSH comes back to normal levels.  Consider increasing her dose of metoprolol if she continues to have symptomatic PVCs.  She will follow-up in 4 to 6 weeks to recheck CMET and mag level.  Do not need further work-up at this time-her echocardiogram and cardiac catheterization were normal during her hospitalization.  Hypothyroidism Continue levothyroxine 100 mcg daily.  She will follow-up in 4 to 6 weeks to repeat TSH level.  Low vitamin B12 level Continue self-administered injections.  She will follow-up in 4 to 6 weeks to recheck B12 level.  Pulmonary nodule - needs repeat 12/2018 Incidentally noted on her CT scan during hospitalization.  Will need repeat CT scan in 1 year.  Patient has a high level of medical complexity due to number of diagnoses/treatment options and amount/complexity of data reviewed.   Katina Degreealeb M. Jimmey RalphParker, MD 12/19/2017 8:53 AM

## 2017-12-19 NOTE — Assessment & Plan Note (Signed)
Incidentally noted on her CT scan during hospitalization.  Will need repeat CT scan in 1 year.

## 2017-12-19 NOTE — Assessment & Plan Note (Signed)
Continue levothyroxine 100 mcg daily.  She will follow-up in 4 to 6 weeks to repeat TSH level.

## 2017-12-19 NOTE — Assessment & Plan Note (Addendum)
Not apparent on today's exam.  Symptoms seem to be improving.  We will continue her magnesium supplement.  Hopefully will have continued improvement as her TSH comes back to normal levels.  Consider increasing her dose of metoprolol if she continues to have symptomatic PVCs.  She will follow-up in 4 to 6 weeks to recheck CMET and mag level.  Do not need further work-up at this time-her echocardiogram and cardiac catheterization were normal during her hospitalization.

## 2017-12-19 NOTE — Patient Instructions (Signed)
It was very nice to see you today!  I am glad you are doing a little better.  No changes today.   Come back in 4 to 6 weeks to recheck your blood levels.  We will need to repeat your CT scan in 1 year.  Take care, Dr Jimmey RalphParker

## 2017-12-19 NOTE — Assessment & Plan Note (Signed)
Continue self-administered injections.  She will follow-up in 4 to 6 weeks to recheck B12 level.

## 2018-01-19 ENCOUNTER — Other Ambulatory Visit (INDEPENDENT_AMBULATORY_CARE_PROVIDER_SITE_OTHER): Payer: BLUE CROSS/BLUE SHIELD

## 2018-01-19 DIAGNOSIS — E039 Hypothyroidism, unspecified: Secondary | ICD-10-CM | POA: Diagnosis not present

## 2018-01-19 DIAGNOSIS — Z206 Contact with and (suspected) exposure to human immunodeficiency virus [HIV]: Secondary | ICD-10-CM

## 2018-01-19 DIAGNOSIS — I493 Ventricular premature depolarization: Secondary | ICD-10-CM

## 2018-01-19 DIAGNOSIS — E538 Deficiency of other specified B group vitamins: Secondary | ICD-10-CM | POA: Diagnosis not present

## 2018-01-19 DIAGNOSIS — R7989 Other specified abnormal findings of blood chemistry: Secondary | ICD-10-CM

## 2018-01-19 LAB — COMPREHENSIVE METABOLIC PANEL
ALT: 12 U/L (ref 0–35)
AST: 13 U/L (ref 0–37)
Albumin: 3.9 g/dL (ref 3.5–5.2)
Alkaline Phosphatase: 82 U/L (ref 39–117)
BUN: 8 mg/dL (ref 6–23)
CHLORIDE: 104 meq/L (ref 96–112)
CO2: 26 mEq/L (ref 19–32)
Calcium: 8.8 mg/dL (ref 8.4–10.5)
Creatinine, Ser: 0.76 mg/dL (ref 0.40–1.20)
GFR: 87.83 mL/min (ref >60.00)
Glucose, Bld: 139 mg/dL — ABNORMAL HIGH (ref 70–99)
POTASSIUM: 3.6 meq/L (ref 3.5–5.1)
SODIUM: 139 meq/L (ref 135–145)
Total Bilirubin: 0.6 mg/dL (ref 0.2–1.2)
Total Protein: 6.7 g/dL (ref 6.0–8.3)

## 2018-01-19 LAB — TSH: TSH: 1.71 u[IU]/mL (ref 0.35–4.50)

## 2018-01-19 LAB — MAGNESIUM: Magnesium: 1.7 mg/dL (ref 1.5–2.5)

## 2018-01-19 LAB — VITAMIN B12: VITAMIN B 12: 564 pg/mL (ref 211–911)

## 2018-01-20 NOTE — Progress Notes (Signed)
Please inform patient of the following:  Her B12 level is improving and at range. She should continue her current injections, or switch to an oral supplement of 1000mcg daily. We can recheck again in 6-12 months. Her magnesium level is stable. Do not need to do further testing at this point. Her TSH has normalized. She should continue her current dose of her thyroid medication. Her sugar was a little high, however I do not think this was a fasting blood draw.  The rest of her blood work was normal.  Katina Degreealeb M. Jimmey RalphParker, MD 01/20/2018 1:00 PM

## 2018-01-21 ENCOUNTER — Other Ambulatory Visit: Payer: Self-pay

## 2018-01-21 MED ORDER — LEVOTHYROXINE SODIUM 100 MCG PO TABS: 100.0000 ug | ORAL_TABLET | Freq: Every day | ORAL | 5 refills | Status: DC

## 2018-01-21 MED ORDER — OMEPRAZOLE 40 MG PO CPDR: 40.0000 mg | DELAYED_RELEASE_CAPSULE | Freq: Every day | ORAL | 5 refills | Status: DC

## 2018-01-26 ENCOUNTER — Other Ambulatory Visit: Payer: Self-pay

## 2018-01-26 MED ORDER — OMEPRAZOLE 40 MG PO CPDR: 40.0000 mg | DELAYED_RELEASE_CAPSULE | Freq: Every day | ORAL | 5 refills | Status: DC

## 2018-01-26 MED ORDER — LEVOTHYROXINE SODIUM 100 MCG PO TABS: 100.0000 ug | ORAL_TABLET | Freq: Every day | ORAL | 5 refills | Status: DC

## 2018-01-27 ENCOUNTER — Other Ambulatory Visit: Payer: Self-pay | Admitting: Family Medicine

## 2018-02-03 ENCOUNTER — Encounter: Payer: Self-pay | Admitting: Family Medicine

## 2018-02-03 ENCOUNTER — Ambulatory Visit (INDEPENDENT_AMBULATORY_CARE_PROVIDER_SITE_OTHER): Payer: BLUE CROSS/BLUE SHIELD | Admitting: Family Medicine

## 2018-02-03 VITALS — BP 124/78 | HR 91 | Temp 97.6°F | Ht 64.0 in | Wt 291.6 lb

## 2018-02-03 DIAGNOSIS — K1379 Other lesions of oral mucosa: Secondary | ICD-10-CM | POA: Diagnosis not present

## 2018-02-03 DIAGNOSIS — B002 Herpesviral gingivostomatitis and pharyngotonsillitis: Secondary | ICD-10-CM | POA: Insufficient documentation

## 2018-02-03 DIAGNOSIS — B009 Herpesviral infection, unspecified: Secondary | ICD-10-CM | POA: Diagnosis not present

## 2018-02-03 MED ORDER — VALACYCLOVIR HCL 1 G PO TABS: 1000.0000 mg | ORAL_TABLET | Freq: Two times a day (BID) | ORAL | 0 refills | Status: AC

## 2018-02-03 NOTE — Patient Instructions (Signed)
It was very nice to see you today!  Your cheek swelling from an infection from the cold sore virus.  Please start the Valtrex.  Please let me know if your symptoms worsen or do not improve.  You can try swishing with warm salt water to help with your symptoms.  Take care, Dr Dorien ChihuahuaParker   Cold Sore A cold sore, also called a fever blister, is a skin infection that is caused by a virus. This infection causes small, fluid-filled sores to form inside of the mouth or on the lips, gums, nose, chin, or cheeks. Cold sores can spread to other parts of the body, such as the eyes or fingers. Cold sores can be spread or passed from person to person (contagious) until the sores crust over completely. Cold sores can be spread through close contact, such as kissing or sharing a drinking glass. Follow these instructions at home: Medicines  Take or apply over-the-counter and prescription medicines only as told by your doctor.  Use a cotton-tip swab to apply creams or gels to your sores. Sore Care  Do not touch the sores or pick the scabs.  Wash your hands often. Do not touch your eyes without washing your hands first.  Keep the sores clean and dry.  If directed, apply ice to the sores:  Put ice in a plastic bag.  Place a towel between your skin and the bag.  Leave the ice on for 20 minutes, 2-3 times per day. Lifestyle  Do not kiss, have oral sex, or share personal items until your sores heal.  Eat a soft, bland diet. Avoid eating hot, cold, or salty foods. These can hurt your mouth.  Use a straw if it hurts to drink out of a glass.  Avoid the sun and limit your stress if these things trigger outbreaks. If sun causes cold sores, apply sunscreen on your lips before being out in the sun. Contact a doctor if:  You have symptoms for more than two weeks.  You have pus coming from the sores.  You have redness that is spreading.  You have pain or irritation in your eye.  You get sores on  your genitals.  Your sores do not heal within two weeks.  You get cold sores often. Get help right away if:  You have a fever and your symptoms suddenly get worse.  You have a headache and confusion. This information is not intended to replace advice given to you by your health care provider. Make sure you discuss any questions you have with your health care provider. Document Released: 12/24/2011 Document Revised: 11/30/2015 Document Reviewed: 04/14/2015 Elsevier Interactive Patient Education  [employer]2018 [employer] Inc[employer]

## 2018-02-03 NOTE — Progress Notes (Signed)
   Subjective:  Emily Maldonado is a 42 y.o. female who presents today for same-day appointment with a chief complaint of cheek swelling.   HPI:  Cheek Swelling, Acute problem Started 2 days ago.  Located to her inner right cheek.  No clear precipitating events.  Painful to touch.  No fevers or chills.  No specific treatments tried.  No other obvious alleviating or aggravating factors.  No history of cold sores.  ROS: Per HPI  PMH: She reports that she quit smoking about 5 months ago. Her smoking use included cigarettes. She smoked 1.00 pack per day. She has never used smokeless tobacco. She reports that she drinks alcohol. She reports that she does not use drugs.  Objective:  Physical Exam: BP 124/78 (BP Location: Left Arm, Patient Position: Sitting, Cuff Size: Large)   Pulse 91   Temp 97.6 F (36.4 C) (Oral)   Ht 5\' 4"  (1.626 m)   Wt 291 lb 9.6 oz (132.3 kg)   SpO2 94%   BMI 50.05 kg/m   Gen: NAD, resting comfortably HEENT: Right buccal mucosa with vesicular rash on erythematous base.  Tender to palpation.  Mild edema in the area as well.  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  Patient does not have a prior history of cold sores-this may represent a primary infection.  Could also represent reactivation of latent virus.  Will start Valtrex 1 g twice daily for the next week.  Also recommended use of warm salt water swishes.  Discussed reasons to return to care.  Follow-up as needed.  Katina Degreealeb M. Jimmey RalphParker, MD 02/03/2018 9:49 AM

## 2018-03-06 ENCOUNTER — Ambulatory Visit: Payer: BLUE CROSS/BLUE SHIELD | Admitting: Certified Nurse Midwife

## 2018-03-06 ENCOUNTER — Other Ambulatory Visit: Payer: Self-pay

## 2018-03-06 ENCOUNTER — Encounter: Payer: Self-pay | Admitting: Certified Nurse Midwife

## 2018-03-06 ENCOUNTER — Other Ambulatory Visit (HOSPITAL_COMMUNITY)
Admission: RE | Admit: 2018-03-06 | Discharge: 2018-03-06 | Disposition: A | Discharge status: Home / Self Care | Payer: BLUE CROSS/BLUE SHIELD | Source: Ambulatory Visit | Attending: Certified Nurse Midwife | Admitting: Certified Nurse Midwife

## 2018-03-06 VITALS — BP 132/82 | HR 70 | Resp 16 | Ht 65.25 in | Wt 287.0 lb

## 2018-03-06 DIAGNOSIS — Z8742 Personal history of other diseases of the female genital tract: Secondary | ICD-10-CM

## 2018-03-06 DIAGNOSIS — Z124 Encounter for screening for malignant neoplasm of cervix: Secondary | ICD-10-CM | POA: Insufficient documentation

## 2018-03-06 DIAGNOSIS — N920 Excessive and frequent menstruation with regular cycle: Secondary | ICD-10-CM | POA: Diagnosis not present

## 2018-03-06 DIAGNOSIS — E663 Overweight: Secondary | ICD-10-CM

## 2018-03-06 DIAGNOSIS — Z0000 Encounter for general adult medical examination without abnormal findings: Secondary | ICD-10-CM

## 2018-03-06 DIAGNOSIS — Z803 Family history of malignant neoplasm of breast: Secondary | ICD-10-CM

## 2018-03-06 DIAGNOSIS — Z8049 Family history of malignant neoplasm of other genital organs: Secondary | ICD-10-CM

## 2018-03-06 DIAGNOSIS — N946 Dysmenorrhea, unspecified: Secondary | ICD-10-CM

## 2018-03-06 DIAGNOSIS — Z8639 Personal history of other endocrine, nutritional and metabolic disease: Secondary | ICD-10-CM

## 2018-03-06 DIAGNOSIS — Z87898 Personal history of other specified conditions: Secondary | ICD-10-CM

## 2018-03-06 DIAGNOSIS — Z01419 Encounter for gynecological examination (general) (routine) without abnormal findings: Secondary | ICD-10-CM

## 2018-03-06 NOTE — Patient Instructions (Signed)

## 2018-03-06 NOTE — Progress Notes (Signed)
42 y.o. [id number] Single  Caucasian Fe here to establish gyn care and for annual exam. Periods normal every 28 days, with cramping on first day uses pad and tampon for the first 1-3 days to handle heavy bleeding and then becomes lighter for remaining 3 days.. Had Mirena IUD and with some cycle control in past.. Has had some weight gain due to Hypothyroid changes with Hashimotos, and elevated levels with palpitations,ended up with  cardiac cath, all  Normal.  Patient history of abnormal pap smear with LEEP several years ago? Date. ? Oral HSV, but patient feels it was tooth paste reaction.  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 Used Lysine when occurred and resolved in two days. Hypertension/hypothyroid/migraine/B 12 deficiency with PCP Dr. Jimmey RalphParker management, stable at this time. Mother history of breast cancer age 44 negative BRACA gene, but also had cervical cancer.   Patient would like to have permanent cycle control if possible. Cycles are debilitating at times and definitely have changed with pain and amount. Not interested in IUD or POP. No other health concerns today.  Patient's last menstrual period was 02/16/2018 (exact date).          Sexually active: Yes.    The current method of family planning is tubal ligation.    Exercising: No.  exercise Smoker:  yes  Review of Systems  Constitutional: Negative.   HENT: Negative.   Eyes: Negative.   Respiratory: Negative.   Cardiovascular: Negative.   Gastrointestinal: Negative.   Genitourinary: Negative.   Musculoskeletal: Negative.   Skin: Negative.   Neurological: Negative.   Endo/Heme/Allergies: Negative.   Psychiatric/Behavioral: Negative.     Health Maintenance: Pap:  2350yrs ago History of Abnormal Pap: yes MMG:  09-25-17 category b density birads 1:neg  Self Breast exams: yes Colonoscopy:  none BMD:   none TDaP:  UTD Shingles: no Pneumonia: not done Hep C and HIV: HIV neg 2019, would like Hep C done Labs: if needed   reports  that she has been smoking cigarettes. She has been smoking about 1.00 pack per day. She has never used smokeless tobacco. She reports that she drank alcohol. She reports that she does not use drugs.  Past Medical History:  Diagnosis Date  . Abnormal Pap smear of cervix   . Anemia    b12  . Anxiety   . B12 deficiency anemia   . Childhood asthma   . Former tobacco use   . Hypertension   . Hypothyroidism   . Migraines    aura  . Morbid obesity (HCC)   . Ovarian cyst   . Sleep apnea    Use C-pap     Past Surgical History:  Procedure Laterality Date  . BREAST BIOPSY Left 2014  . BREAST EXCISIONAL BIOPSY Right   . CERVICAL BIOPSY  W/ LOOP ELECTRODE EXCISION    . LEFT HEART CATH AND CORONARY ANGIOGRAPHY N/A 12/04/2017   Procedure: LEFT HEART CATH AND CORONARY ANGIOGRAPHY;  Surgeon: Kathleene HazelMcAlhany, Christopher D, MD;  Location: [facility];  Service: Cardiovascular;  Laterality: N/A;  . TONSILLECTOMY    . TUBAL LIGATION      Current Outpatient Medications  Medication Sig Dispense Refill  . adapalene (DIFFERIN) 0.1 % cream Apply topically at bedtime. 45 g 0  . aspirin-acetaminophen-caffeine (EXCEDRIN MIGRAINE) 250-250-65 MG tablet Take 2 tablets by mouth at bedtime as needed for headache or migraine.    . cyanocobalamin (,VITAMIN B-12,) 1000 MCG/ML injection Inject 1 mL (1,000 mcg total)  into the muscle daily. IM Daily x 6 days, then IM weekly x 1 month, then monthly 25 mL 0  . ibuprofen (ADVIL,MOTRIN) 200 MG tablet Take 600 mg by mouth daily as needed for headache.    . levothyroxine (SYNTHROID, LEVOTHROID) 100 MCG tablet Take 1 tablet (100 mcg total) by mouth daily before breakfast. 30 tablet 5  . LYSINE PO Take by mouth.    . Magnesium 500 MG CAPS Take 500 mg by mouth daily.    . metoprolol succinate (TOPROL-XL) 25 MG 24 hr tablet Take 1 tablet (25 mg total) by mouth daily. 90 tablet 3  . omeprazole (PRILOSEC) 40 MG capsule Take 1 capsule (40 mg total) by  mouth daily. 30 capsule 5  . traMADol (ULTRAM) 50 MG tablet Take 1-2 tablets (50-100 mg total) by mouth every 6 (six) hours as needed. 20 tablet 0   No current facility-administered medications for this visit.     Family History  Problem Relation Age of Onset  . Breast cancer Mother 6  . Heart failure Mother   . Atrial fibrillation Mother   . Cervical cancer Mother   . Diabetes Mother   . Hypertension Mother   . Heart failure Father   . Hypertension Father   . Heart failure Maternal Grandmother   . Atrial fibrillation Maternal Grandmother   . Stroke Maternal Grandmother   . Uterine cancer Maternal Grandmother   . Diabetes Brother   . Hypertension Brother   . Uterine cancer Maternal Aunt     ROS:  Pertinent items are noted in HPI.  Otherwise, a comprehensive ROS was negative.  Exam:   BP 132/82   Pulse 70   Resp 16   Ht 5' 5.25" (1.657 m)   Wt 287 lb (130.2 kg)   LMP 02/16/2018 (Exact Date)   BMI 47.39 kg/m  Height: 5' 5.25" (165.7 cm) Ht Readings from Last 3 Encounters:  03/06/18 5' 5.25" (1.657 m)  02/03/18 5\' 4"  (1.626 m)  12/19/17 5\' 4"  (1.626 m)    General appearance: alert, cooperative and appears stated age Head: Normocephalic, without obvious abnormality, atraumatic Neck: no adenopathy, supple, symmetrical, trachea midline and thyroid normal to inspection and palpation Lungs: clear to auscultation bilaterally Breasts: normal appearance, no masses or tenderness, No nipple retraction or dimpling, No nipple discharge or bleeding, No axillary or supraclavicular adenopathy Heart: regular rate and rhythm Abdomen: soft, non-tender; no masses,  no organomegaly Extremities: extremities normal, atraumatic, no cyanosis or edema Skin: Skin color, texture, turgor normal. No rashes or lesions Lymph nodes: Cervical, supraclavicular, and axillary nodes normal. No abnormal inguinal nodes palpated Neurologic: Grossly normal   Pelvic: External genitalia:  no lesions,  normal female, small ingrown hair noted in mid mons area, slight pink,non tender, appears to be resolving              Urethra:  normal appearing urethra with no masses, tenderness or lesions              Bartholin's and Skene's: normal                 Vagina: normal appearing vagina with normal color and discharge, no lesions              Cervix: no cervical motion tenderness, no lesions and normal appearance              Pap taken: Yes.   Bimanual Exam:  Uterus:  normal size, contour, position, consistency,  mobility, non-tender and limited by body habitus               Adnexa: normal adnexa and no mass, fullness, tenderness               Rectovaginal: Confirms               Anus:  normal sphincter tone, no lesions  Chaperone present: yes  A:  Well Woman with normal exam  Contraception BTL  Dysmenorrhea with menorrhagia with regular period which is a change, desires cycle control  Overweight with weight gain patient feels related to thyroid  Hypothyroid,palpitations, GERD, B 12 deficiency with PCP management  History of abnormal pap with LEEP  Family history of breast (age 22, BRACA negative) and cervical cancer mother  Screening lab    P:   Reviewed health and wellness pertinent to exam  Discussed exam and concerns with cycle changes with menorrhagia/dysmenorrhea, would recommend PUS for evaluation. Patient agreeable. Patient aware she will be called with insurance info and scheduled. Discussed also possible ablation for control and would need endo biopsy to assess prior to procedure. Given brochure to read and discuss with MD at [facility].  Discussed weight change does affect cycles also and encouraged to work weight loss with better diet.  Continue follow up with PCP as indicated.  Lab: Hep. C  Pap smear: yes   counseled on breast self exam, mammography screening especially with family history, feminine hygiene, adequate intake of calcium and vitamin D, diet and exercise  return  annually or prn  An After Visit Summary was printed and given to the patient.

## 2018-03-07 LAB — HEPATITIS C ANTIBODY: Hep C Virus Ab: <0.1 s/co ratio (ref 0.0–0.9)

## 2018-03-10 ENCOUNTER — Telehealth: Payer: Self-pay | Admitting: Certified Nurse Midwife

## 2018-03-10 NOTE — Telephone Encounter (Signed)
Call placed to review benefits and schedule recommended ultrasound. Unable to leave a voicemail, as patients mailbox is full

## 2018-03-11 LAB — CYTOLOGY - PAP
Diagnosis: UNDETERMINED — AB
HPV (WINDOPATH): DETECTED — AB

## 2018-03-11 NOTE — Telephone Encounter (Signed)
Spoke with patient regarding benefit for ultrasound. Patient understood and agreeable. Patient ready to schedule. Patient requested to schedule next Thursday.  Patient is scheduled 03/19/18 with Dr Hyacinth Meeker. Patient aware of appointment date, arrival time and cancellation policy. No further questions. Ok to close

## 2018-03-12 ENCOUNTER — Telehealth: Payer: Self-pay | Admitting: *Deleted

## 2018-03-12 ENCOUNTER — Other Ambulatory Visit: Payer: Self-pay | Admitting: Certified Nurse Midwife

## 2018-03-12 DIAGNOSIS — R87610 Atypical squamous cells of undetermined significance on cytologic smear of cervix (ASC-US): Secondary | ICD-10-CM

## 2018-03-12 NOTE — Telephone Encounter (Signed)
-----   Message from Verner Chol, CNM sent at 03/12/2018  7:59 AM EDT ----- Notify patient her pap smear showed ASCUS with positive HPV she will need colposcopy for evaluation. Had history of abnormal pap in past with [facility]. Please schedule

## 2018-03-12 NOTE — Telephone Encounter (Signed)
Spoke with patient, advised as seen below per Leota Sauers, CNM. LMP 02/16/18. BTL for contraceptive. Colpo scheduled for 9/6 at 11am with Leota Sauers, CNM. Advised to take Motrin 800 mg with food and water one hour before procedure. Patient verbalizes understanding and is agreeable.   Call forwarded to [place]. [place] to review benefits.   Encounter closed.

## 2018-03-12 NOTE — Telephone Encounter (Signed)
Notes recorded by Leda Min, RN on 03/12/2018 at 10:47 AM EDT Left message to call Noreene Larsson at [phone].  ------

## 2018-03-13 ENCOUNTER — Encounter: Payer: Self-pay | Admitting: Certified Nurse Midwife

## 2018-03-13 ENCOUNTER — Telehealth: Payer: Self-pay

## 2018-03-13 ENCOUNTER — Ambulatory Visit (INDEPENDENT_AMBULATORY_CARE_PROVIDER_SITE_OTHER): Payer: BLUE CROSS/BLUE SHIELD | Admitting: Certified Nurse Midwife

## 2018-03-13 VITALS — BP 118/82 | HR 68 | Resp 16 | Ht 65.25 in | Wt 286.0 lb

## 2018-03-13 DIAGNOSIS — R87610 Atypical squamous cells of undetermined significance on cytologic smear of cervix (ASC-US): Secondary | ICD-10-CM

## 2018-03-13 DIAGNOSIS — N870 Mild cervical dysplasia: Secondary | ICD-10-CM | POA: Diagnosis not present

## 2018-03-13 DIAGNOSIS — R87810 Cervical high risk human papillomavirus (HPV) DNA test positive: Secondary | ICD-10-CM

## 2018-03-13 DIAGNOSIS — N72 Inflammatory disease of cervix uteri: Secondary | ICD-10-CM | POA: Diagnosis not present

## 2018-03-13 NOTE — Progress Notes (Signed)
Patient ID: Emily Maldonado, female   DOB: July 30, 1975, 42 y.o.   MRN: [id number]  Chief Complaint  Patient presents with  . Colposcopy    //jj    HPI Emily Maldonado is a 42 y.o. female.  Patient here for colposcopy exam, contraception tubal ligation. Denies pelvic pain or vaginal bleeding.Previous history of LEEP for abnormal pap smear. Different partner at that time. Has not had HPV until this pap smear. Patient declined Advil prior to procedure. 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    Indications: Pap smear on March 06 2018 showed: ASCUS with POSITIVE high risk HPV. Previous colposcopy: in her 24's. Prior cervical treatment: LEEP? date.  Past Medical History:  Diagnosis Date  . Abnormal Pap smear of cervix   . Anemia    b12  . Anxiety   . B12 deficiency anemia   . Childhood asthma   . Former tobacco use   . Hypertension   . Hypothyroidism   . Migraines    aura  . Morbid obesity (HCC)   . Ovarian cyst   . Sleep apnea    Use C-pap     Past Surgical History:  Procedure Laterality Date  . BREAST BIOPSY Left 2014  . BREAST EXCISIONAL BIOPSY Right   . CERVICAL BIOPSY  W/ LOOP ELECTRODE EXCISION    . LEFT HEART CATH AND CORONARY ANGIOGRAPHY N/A 12/04/2017   Procedure: LEFT HEART CATH AND CORONARY ANGIOGRAPHY;  Surgeon: Kathleene Hazel, MD;  Location: [facility];  Service: Cardiovascular;  Laterality: N/A;  . TONSILLECTOMY    . TUBAL LIGATION      Family History  Problem Relation Age of Onset  . Breast cancer Mother 77  . Heart failure Mother   . Atrial fibrillation Mother   . Cervical cancer Mother   . Diabetes Mother   . Hypertension Mother   . Heart failure Father   . Hypertension Father   . Heart failure Maternal Grandmother   . Atrial fibrillation Maternal Grandmother   . Stroke Maternal Grandmother   . Uterine cancer Maternal Grandmother   . Diabetes Brother   . Hypertension Brother   . Uterine cancer Maternal Aunt     Social  History Social History   Tobacco Use  . Smoking status: Current Every Day Smoker    Packs/day: 1.00    Types: Cigarettes  . Smokeless tobacco: Never Used  Substance Use Topics  . Alcohol use: Not Currently  . Drug use: No    Allergies  Allergen Reactions  . Penicillins Anaphylaxis    Has patient had a PCN reaction causing immediate rash, facial/tongue/throat swelling, SOB or lightheadedness with hypotension: /No Has patient had a PCN reaction causing severe rash involving mucus membranes or skin necrosis: No Has patient had a PCN reaction that required hospitalization: No Has patient had a PCN reaction occurring within the last 10 years: No If all of the above answers are "NO", then may proceed with Cephalosporin use.   . Shellfish Allergy Anaphylaxis  . Zithromax [Azithromycin] Rash    Current Outpatient Medications  Medication Sig Dispense Refill  . adapalene (DIFFERIN) 0.1 % cream Apply topically at bedtime. 45 g 0  . aspirin-acetaminophen-caffeine (EXCEDRIN MIGRAINE) 250-250-65 MG tablet Take 2 tablets by mouth at bedtime as needed for headache or migraine.    . cyanocobalamin (,VITAMIN B-12,) 1000 MCG/ML injection Inject 1 mL (1,000 mcg total) into the muscle daily. IM Daily x 6 days, then IM weekly  x 1 month, then monthly 25 mL 0  . ibuprofen (ADVIL,MOTRIN) 200 MG tablet Take 600 mg by mouth daily as needed for headache.    . levothyroxine (SYNTHROID, LEVOTHROID) 100 MCG tablet Take 1 tablet (100 mcg total) by mouth daily before breakfast. 30 tablet 5  . LYSINE PO Take by mouth.    . Magnesium 500 MG CAPS Take 500 mg by mouth daily.    . metoprolol succinate (TOPROL-XL) 25 MG 24 hr tablet Take 1 tablet (25 mg total) by mouth daily. 90 tablet 3  . omeprazole (PRILOSEC) 40 MG capsule Take 1 capsule (40 mg total) by mouth daily. 30 capsule 5  . traMADol (ULTRAM) 50 MG tablet Take 1-2 tablets (50-100 mg total) by mouth every 6 (six) hours as needed. 20  tablet 0   No current facility-administered medications for this visit.     Review of Systems Review of Systems  Constitutional: Negative.   HENT: Negative.   Eyes: Negative.   Respiratory: Negative.   Cardiovascular: Negative.   Gastrointestinal: Negative.   Endocrine: Negative.   Genitourinary: Negative.   Skin: Negative.   Allergic/Immunologic:       See list  Neurological: Negative.   Hematological: Negative.   Psychiatric/Behavioral: Negative.  The patient is not nervous/anxious.     Blood pressure 118/82, pulse 68, resp. rate 16, height 5' 5.25" (1.657 m), weight 286 lb (129.7 kg), last menstrual period 02/16/2018.  Physical Exam Physical Exam  Constitutional: She is oriented to person, place, and time. She appears well-developed and well-nourished.  Genitourinary: Vagina normal. Pelvic exam was performed with patient supine. There is no rash, tenderness, lesion or injury on the right labia. There is no rash, tenderness, lesion or injury on the left labia. Cervix exhibits no motion tenderness and no discharge.    Lymphadenopathy: No inguinal adenopathy noted on the right or left side.  Neurological: She is alert and oriented to person, place, and time.  Skin: Skin is warm and dry.  Psychiatric: She has a normal mood and affect. Her behavior is normal. Judgment and thought content normal.    Data Reviewed Reviewed pap smear results with patient and consent. Questions addressed.  Assessment    Procedure Details  The risks and benefits of the procedure and Written informed consent obtained. Patient denies any health problems today.   Speculum placed in vagina and excellent visualization of cervix achieved, large cervix noted, cervix swabbed x 3 with saline solution and with acetic acid solution x 3. Cervix visualized with 3.75, 7.5, and 15 # and green filter. Acetowhite area noted at 1 and 11 o'clock, only. Lugol's was not applied due to visualization and shellfish  allergy and anaphylaxis history and possible reaction to Lugol's. Biopsy taken at 1 and 11 o'clock. ECC obtained. Cervix examined again with no other areas noted. Monsel's applied to biopsy areas with no active bleeding upon removal of speculum. Patient tolerated procedure well. Written and Verbal instructions given.      Specimens: 3  Complications: none.     Plan    Specimens labelled and sent to Pathology. Patient will be called with results once reviewed. Discussed with patient she is a candidate for Gardasil vaccine with HPV positive now and previous abnormal pap history of LEEP. Encouraged to consider and check with her insurance for coverage information. Also discussed smoking increase risk of HPV and abnormal pap smear changes. Questions addressed. Patient escorted to check out in stable condition.  Rv as above, prn  Leota Sauers 03/13/2018, 11:04 AM

## 2018-03-13 NOTE — Progress Notes (Signed)
Previous history of LEEP 03-06-18 ASCUS HPV HR +

## 2018-03-13 NOTE — Patient Instructions (Signed)

## 2018-03-13 NOTE — Telephone Encounter (Signed)
Patient asked Leota Sauers, CNM if she should still keep her appointment for her ultrasound on Thursday since she just had her colposcopy done. Leota Sauers, CNM stated Per Dr Edward Jolly, it is okay to have the u/s done. Left detailed message on patients voicemail letting her know. DPR signed.

## 2018-03-17 ENCOUNTER — Telehealth: Payer: Self-pay | Admitting: Obstetrics & Gynecology

## 2018-03-17 DIAGNOSIS — N920 Excessive and frequent menstruation with regular cycle: Secondary | ICD-10-CM

## 2018-03-17 DIAGNOSIS — N946 Dysmenorrhea, unspecified: Secondary | ICD-10-CM

## 2018-03-17 NOTE — Telephone Encounter (Signed)
Spoke with patient and rescheduled her transvaginal ultrasound as she states she is not comfortable with having ultrasound during menses. Scheduled with Dr. Oscar La as she has not established with an MD yet.  Scheduled ultrasound at patient convenience with Dr. Oscar La.  Encounter closed.

## 2018-03-17 NOTE — Telephone Encounter (Signed)
Message left to return call to Emily Maldonado at [phone].   At return call, patient can speak with any staff member who schedules ultrasound to reschedule.

## 2018-03-17 NOTE — Telephone Encounter (Signed)
Patient called and cancelled her ultrasound with Dr. Hyacinth Meeker for this Thursday, 03/19/18, due to starting her menstrual cycle. She said she is just not comfortable coming on her cycle.  Cc: Suzy/Rosa

## 2018-03-19 ENCOUNTER — Other Ambulatory Visit: Payer: BLUE CROSS/BLUE SHIELD | Admitting: Obstetrics & Gynecology

## 2018-03-19 ENCOUNTER — Other Ambulatory Visit: Payer: BLUE CROSS/BLUE SHIELD

## 2018-03-19 ENCOUNTER — Telehealth: Payer: Self-pay | Admitting: Emergency Medicine

## 2018-03-19 NOTE — Telephone Encounter (Signed)
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 No atypia or malignancy

## 2018-03-19 NOTE — Telephone Encounter (Signed)
Message left to return call to Emily Maldonado at [phone].   08 Recall entered  

## 2018-03-19 NOTE — Telephone Encounter (Signed)
-----   Message from Verner Choleborah S Leonard, CNM sent at 03/19/2018  7:54 AM EDT ----- See previous note. Needs repeat pap smear in one year Pap recall 08

## 2018-03-20 NOTE — Telephone Encounter (Signed)
Paitent returned call and message from provider given.  Pt verbalized understanding of results and need for pap in one year.  Has appointment for ultrasound with Dr. Oscar LaJertson on Tuesday and wants to discuss ablation.  Encounter closed.

## 2018-03-24 ENCOUNTER — Ambulatory Visit (INDEPENDENT_AMBULATORY_CARE_PROVIDER_SITE_OTHER): Payer: BLUE CROSS/BLUE SHIELD

## 2018-03-24 ENCOUNTER — Ambulatory Visit (INDEPENDENT_AMBULATORY_CARE_PROVIDER_SITE_OTHER): Payer: BLUE CROSS/BLUE SHIELD | Admitting: Obstetrics and Gynecology

## 2018-03-24 ENCOUNTER — Encounter: Payer: Self-pay | Admitting: Obstetrics and Gynecology

## 2018-03-24 ENCOUNTER — Other Ambulatory Visit: Payer: Self-pay

## 2018-03-24 VITALS — Wt 282.0 lb

## 2018-03-24 DIAGNOSIS — Z7185 Encounter for immunization safety counseling: Secondary | ICD-10-CM

## 2018-03-24 DIAGNOSIS — Z7189 Other specified counseling: Secondary | ICD-10-CM

## 2018-03-24 DIAGNOSIS — Z23 Encounter for immunization: Secondary | ICD-10-CM | POA: Diagnosis not present

## 2018-03-24 DIAGNOSIS — N920 Excessive and frequent menstruation with regular cycle: Secondary | ICD-10-CM | POA: Diagnosis not present

## 2018-03-24 DIAGNOSIS — N946 Dysmenorrhea, unspecified: Secondary | ICD-10-CM | POA: Diagnosis not present

## 2018-03-24 NOTE — Progress Notes (Signed)
GYNECOLOGY  VISIT   HPI: 42 y.o.   Single White or Caucasian Not Hispanic or Latino  female   [phone] with Patient's last menstrual period was 03/17/2018.   here for heavy menstrual and ablation consult.    Her cycles have gotten heavier over the last few years. Menses q 28 days x 5 days. 3 days are very heavy. She is a saturating a super + tampon and a large pad in 2 hours (bleeding over). Cramps occur when she is bleeding heavily, can be severe. Functions, but it's difficult.  Not anemic at last check in 5/19.  She has thyroid dysfunction, hard to manage. TSH is going up and down. Last TSH in 7/19 was 1.71.  H/O tubal ligation.  She had a mirena in the past, helped the flow, not the cramps. She had mood changes with the depo-provera Wants to start the Gardasil series.    GYNECOLOGIC HISTORY: Patient's last menstrual period was 03/17/2018. Contraception: tubal ligation Menopausal hormone therapy: none.         OB History    Gravida  5   Para      Term      Preterm      AB  2   Living        SAB  1   TAB  1   Ectopic      Multiple      Live Births  3              Patient Active Problem List   Diagnosis Date Noted  . 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 Pulmonary nodule - needs repeat 12/2018 12/19/2017  . Low vitamin B12 level 12/05/2017  . PVC's (premature ventricular contractions)   . Morbid obesity (HCC)   . Acne 08/22/2017  . Hypothyroidism 08/22/2017  . Hypertension 08/22/2017    Past Medical History:  Diagnosis Date  . Abnormal Pap smear of cervix   . Anemia    b12  . Anxiety   . B12 deficiency anemia   . Childhood asthma   . Former tobacco use   . Hypertension   . Hypothyroidism   . Migraines    aura  . Morbid obesity (HCC)   . Ovarian cyst   . Sleep apnea    Use C-pap     Past Surgical History:  Procedure Laterality Date  . BREAST BIOPSY Left 2014  . BREAST EXCISIONAL BIOPSY Right   . CERVICAL BIOPSY  W/ LOOP  ELECTRODE EXCISION    . LEFT HEART CATH AND CORONARY ANGIOGRAPHY N/A 12/04/2017   Procedure: LEFT HEART CATH AND CORONARY ANGIOGRAPHY;  Surgeon: Kathleene Hazel, MD;  Location: [facility];  Service: Cardiovascular;  Laterality: N/A;  . TONSILLECTOMY    . TUBAL LIGATION    Cardiac cath was normal.   Current Outpatient Medications  Medication Sig Dispense Refill  . adapalene (DIFFERIN) 0.1 % cream Apply topically at bedtime. 45 g 0  . aspirin-acetaminophen-caffeine (EXCEDRIN MIGRAINE) 250-250-65 MG tablet Take 2 tablets by mouth at bedtime as needed for headache or migraine.    . cyanocobalamin (,VITAMIN B-12,) 1000 MCG/ML injection Inject 1 mL (1,000 mcg total) into the muscle daily. IM Daily x 6 days, then IM weekly x 1 month, then monthly 25 mL 0  . ibuprofen (ADVIL,MOTRIN) 200 MG tablet Take 600 mg by mouth daily as needed for headache.    . levothyroxine (SYNTHROID, LEVOTHROID) 100 MCG tablet Take  1 tablet (100 mcg total) by mouth daily before breakfast. 30 tablet 5  . LYSINE PO Take by mouth.    . Magnesium 500 MG CAPS Take 500 mg by mouth daily.    . metoprolol succinate (TOPROL-XL) 25 MG 24 hr tablet Take 1 tablet (25 mg total) by mouth daily. 90 tablet 3  . omeprazole (PRILOSEC) 40 MG capsule Take 1 capsule (40 mg total) by mouth daily. 30 capsule 5  . traMADol (ULTRAM) 50 MG tablet Take 1-2 tablets (50-100 mg total) by mouth every 6 (six) hours as needed. 20 tablet 0   No current facility-administered medications for this visit.      ALLERGIES: Penicillins; Shellfish allergy; and Zithromax [azithromycin]  Family History  Problem Relation Age of Onset  . Breast cancer Mother 2147  . Heart failure Mother   . Atrial fibrillation Mother   . Cervical cancer Mother   . Diabetes Mother   . Hypertension Mother   . Heart failure Father   . Hypertension Father   . Heart failure Maternal Grandmother   . Atrial fibrillation Maternal Grandmother    . Stroke Maternal Grandmother   . Uterine cancer Maternal Grandmother   . Diabetes Brother   . Hypertension Brother   . Uterine cancer Maternal Aunt     Social History   Socioeconomic History  . Marital status: Single    Spouse name: Not on file  . Number of children: Not on file  . Years of education: Not on file  . Highest education level: Not on file  Occupational History  . Not on file  Social Needs  . Financial resource strain: Not on file  . Food insecurity:    Worry: Not on file    Inability: Not on file  . Transportation needs:    Medical: Not on file    Non-medical: Not on file  Tobacco Use  . Smoking status: Current Every Day Smoker    Packs/day: 1.00    Types: Cigarettes  . Smokeless tobacco: Never Used  Substance and Sexual Activity  . Alcohol use: Not Currently  . Drug use: No  . Sexual activity: Yes    Partners: Male    Birth control/protection: Surgical    Comment: btl  Lifestyle  . Physical activity:    Days per week: Not on file    Minutes per session: Not on file  . Stress: Not on file  Relationships  . Social connections:    Talks on phone: Not on file    Gets together: Not on file    Attends religious service: Not on file    Active member of club or organization: Not on file    Attends meetings of clubs or organizations: Not on file    Relationship status: Not on file  . Intimate partner violence:    Fear of current or ex partner: Not on file    Emotionally abused: Not on file    Physically abused: Not on file    Forced sexual activity: Not on file  Other Topics Concern  . Not on file  Social History Narrative  . Not on file    Review of Systems  Constitutional: Negative.   HENT: Negative.   Eyes: Negative.   Respiratory: Negative.   Cardiovascular: Negative.   Gastrointestinal: Negative.   Endocrine: Negative.   Genitourinary: Negative.   Musculoskeletal: Negative.   Skin: Negative.   Allergic/Immunologic: Negative.    Neurological: Negative.   Hematological: Negative.  Psychiatric/Behavioral: Negative.   All other systems reviewed and are negative.   PHYSICAL EXAMINATION:    Wt 282 lb (127.9 kg)   LMP 03/17/2018 Comment: Tubal ligation  BMI 46.57 kg/m     General appearance: alert, cooperative and appears stated age  Ultrasound images reviewed with the patient.   ASSESSMENT Menorrhagia, not anemic. Suspect possible adenomyosis Severe dysmenorrhea Gardasil, patient wishes to start the series    PLAN Discussed options for treatment, including: the mini-pill, depo-provera, Mirena IUD, endometrial ablation and hysterectomy Discussed that her cramps could worsen with ablation She would like to try the mirena IUD, will return right after her cycle for insertion   An After Visit Summary was printed and given to the patient.  ~15 minutes face to face time of which over 50% was spent in counseling.   CC: Sara Chu, CNM

## 2018-03-25 ENCOUNTER — Telehealth: Payer: Self-pay | Admitting: Obstetrics and Gynecology

## 2018-03-25 NOTE — Telephone Encounter (Signed)
Call placed to convey benefits. 

## 2018-05-08 ENCOUNTER — Telehealth: Payer: Self-pay | Admitting: *Deleted

## 2018-05-08 NOTE — Telephone Encounter (Signed)
Follow-up call to patient. Per ROI, can leave message on voice mail which has number confirmation.  Left message calling to follow-up on plans for IUD.

## 2018-05-18 NOTE — Progress Notes (Deleted)
Patient in today for 2nd Gardasil injection.   Contraception: Tubal ligaion LMP: *** ***  Injection given in Right Deltoid. Patient tolerated shot well.   Patient informed next injection due in about 4 months.  Advised patient, if not on birth control, to return for next injection with cycle.   Routed to provider for final review.  Encounter closed.

## 2018-05-22 ENCOUNTER — Other Ambulatory Visit: Payer: Self-pay

## 2018-05-22 ENCOUNTER — Telehealth: Payer: Self-pay | Admitting: Certified Nurse Midwife

## 2018-05-22 ENCOUNTER — Telehealth: Payer: Self-pay | Admitting: Family Medicine

## 2018-05-22 DIAGNOSIS — E039 Hypothyroidism, unspecified: Secondary | ICD-10-CM

## 2018-05-22 NOTE — Telephone Encounter (Signed)
Copied from CRM [phone]. Topic: Quick Communication - See Telephone Encounter >> May 22, 2018  9:36 AM Jens SomMedley, Jennifer A wrote: CRM for notification. See Telephone encounter for: 05/22/18.  Patient is calling because she requesting order to have her thyroid checked.  Patient is not feeling well and feels like her thyroid needs to be checked. Please advise [phone]

## 2018-05-22 NOTE — Telephone Encounter (Signed)
Ok with me. Please place any necessary orders. 

## 2018-05-22 NOTE — Telephone Encounter (Signed)
Patient states she is returning a call to [place] regarding benefits for Mirena IUD. Says she has spoken to her insurance company and they state that they may have found a loophole for the mirena to be covered; states the diagnosis is covered under the addendum. She should know for sure on Monday 11/18.

## 2018-05-22 NOTE — Telephone Encounter (Signed)
Please advise 

## 2018-05-22 NOTE — Telephone Encounter (Signed)
Patient canceled her upcoming 2nd Gardasill appointment due to insurance issues. She stated that she will call to reschedule once this has been "straightened out with insurance".

## 2018-05-22 NOTE — Telephone Encounter (Signed)
Orders have been placed and patient placed on lab schedule.

## 2018-05-25 ENCOUNTER — Ambulatory Visit: Payer: BLUE CROSS/BLUE SHIELD

## 2018-05-25 ENCOUNTER — Other Ambulatory Visit (INDEPENDENT_AMBULATORY_CARE_PROVIDER_SITE_OTHER): Payer: BLUE CROSS/BLUE SHIELD

## 2018-05-25 DIAGNOSIS — E039 Hypothyroidism, unspecified: Secondary | ICD-10-CM | POA: Diagnosis not present

## 2018-05-25 LAB — TSH: TSH: 1.66 u[IU]/mL (ref 0.35–4.50)

## 2018-05-25 NOTE — Progress Notes (Signed)
Please inform patient of the following:  Her thyroid level is normal. Would recommend she come in for OV to discuss symptoms if they are problematic.   Emily Maldonado M. Jimmey RalphParker, MD 05/25/2018 3:44 PM

## 2018-05-26 NOTE — Telephone Encounter (Signed)
Patient called office on 05-25-18. Left message indicating she is still looking into Mirena coverage with insurance company.  Routing to Dr Oscar LaJertson and Lovett Soxebbi Leonard CNM. Encounter closed.

## 2018-05-27 NOTE — Telephone Encounter (Signed)
Yes, patient aware of need to complete series

## 2018-05-27 NOTE — Telephone Encounter (Signed)
Okay to close this encounter? °

## 2018-08-01 IMAGING — CT CT ANGIO CHEST
1 of 6 series · 18 of 36 positions shown · IV contrast (ISOVUE 370)
Comparison: Chest radiograph December 03, 2017

CLINICAL DATA: Chest pain

EXAM:
CT ANGIOGRAPHY CHEST WITH CONTRAST
TECHNIQUE: Multidetector CT imaging of the chest was performed using the
standard protocol during bolus administration of intravenous
contrast. Multiplanar CT image reconstructions and MIPs were
obtained to evaluate the vascular anatomy.
CONTRAST:  100mL 5KR9NS-6LZ IOPAMIDOL ([id number]-[id number]) INJECTION 76%

[Series 5: thins · axial · 0.86mm/px · z∈[-350,-95]mm · 18 of 285 slices shown]
[im 15/285  lung]
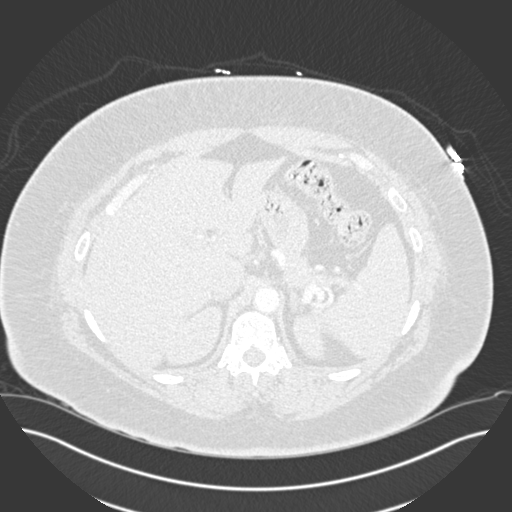
[im 29/285  mediastinal]
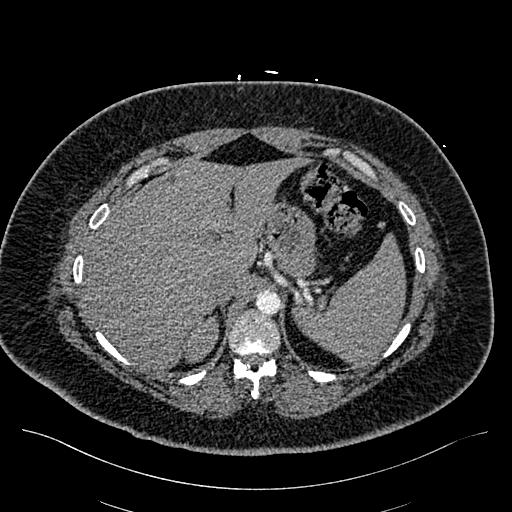
[im 43/285  lung]
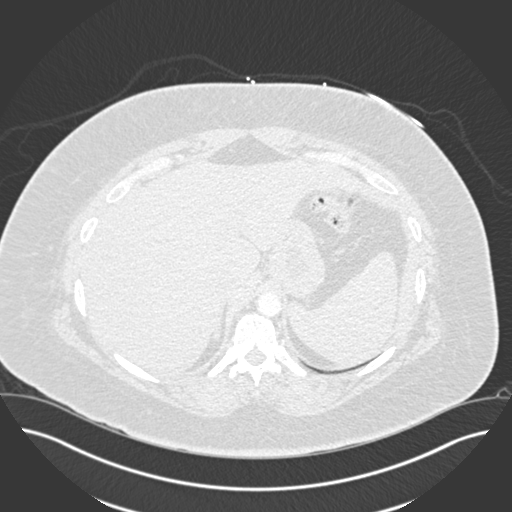
[im 57/285  mediastinal]
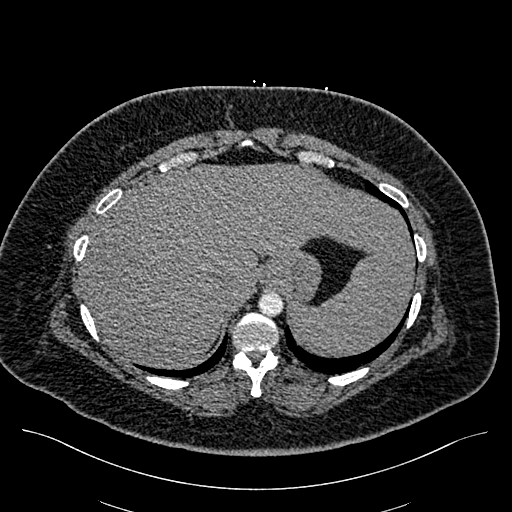
[im 72/285  lung]
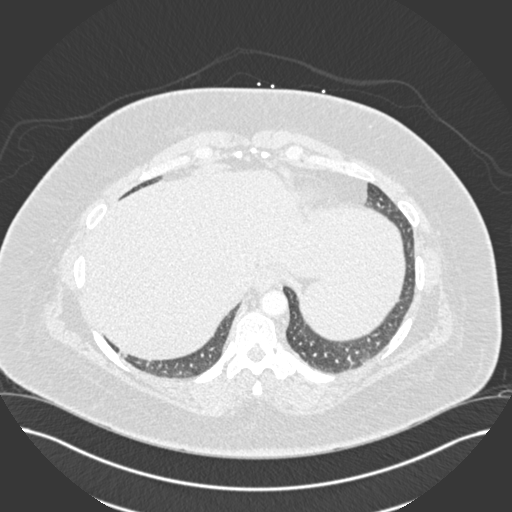
[im 86/285  mediastinal]
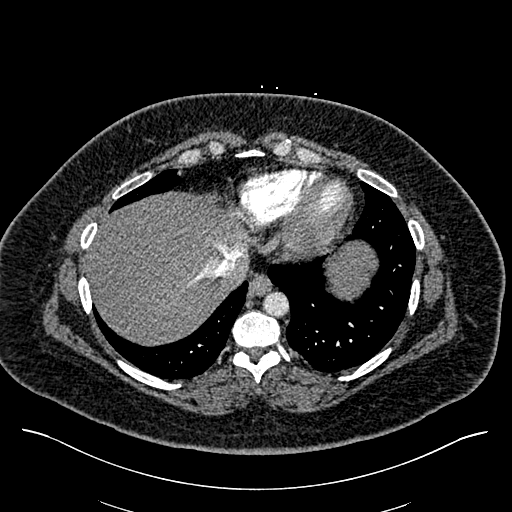
[im 100/285  lung]
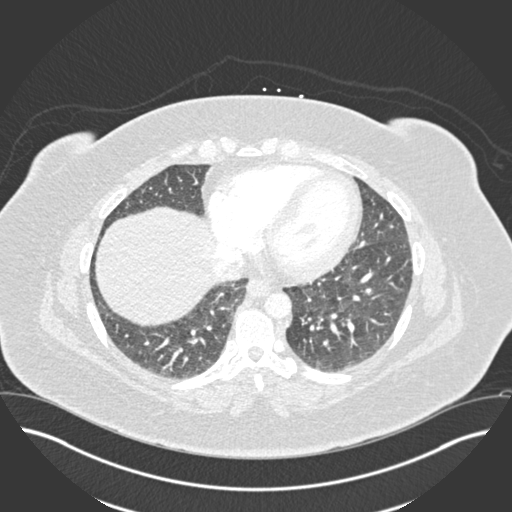
[im 114/285  mediastinal]
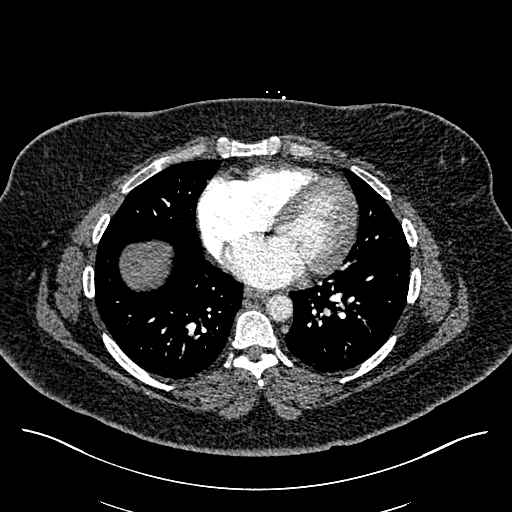
[im 128/285  lung]
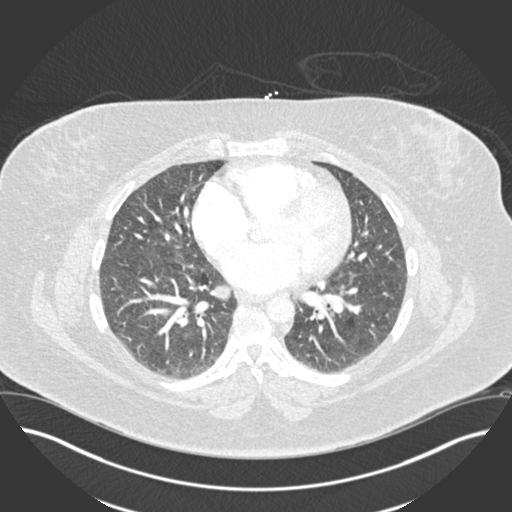
[im 157/285  mediastinal]
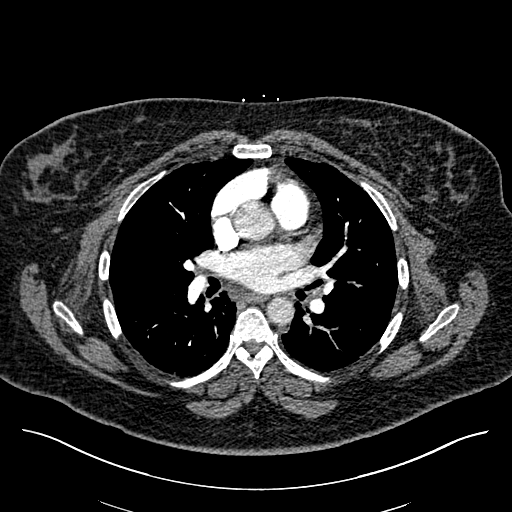
[im 171/285  lung]
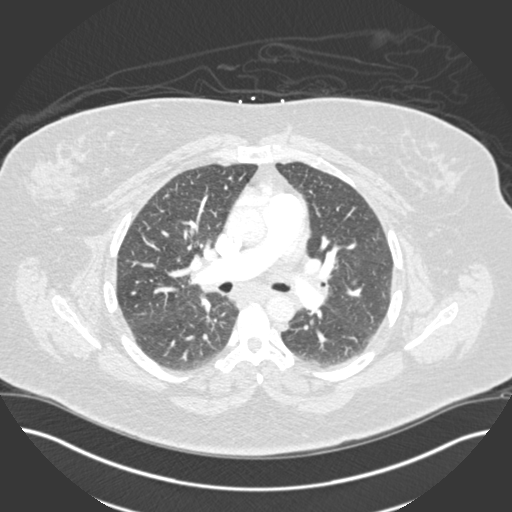
[im 185/285  mediastinal]
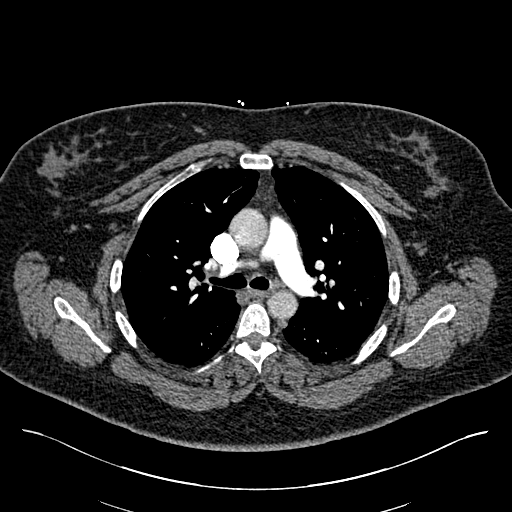
[im 199/285  lung]
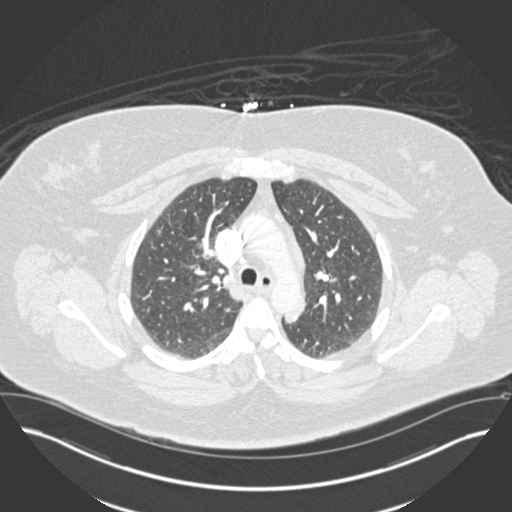
[im 214/285  mediastinal]
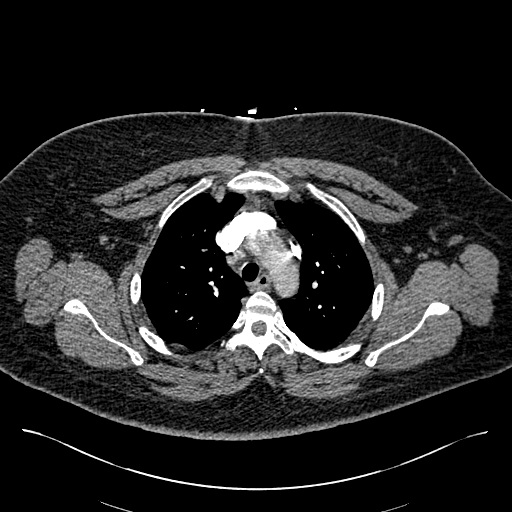
[im 228/285  lung]
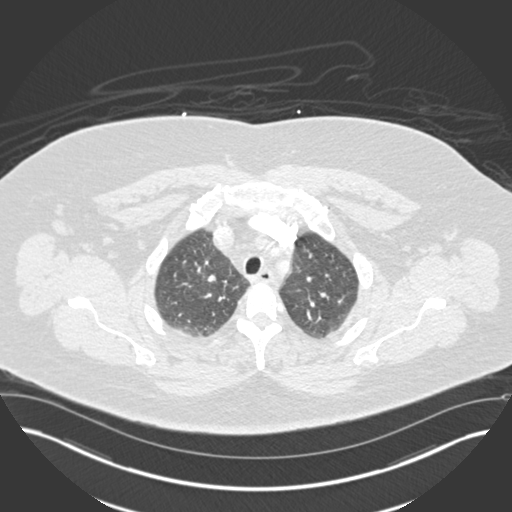
[im 242/285  mediastinal]
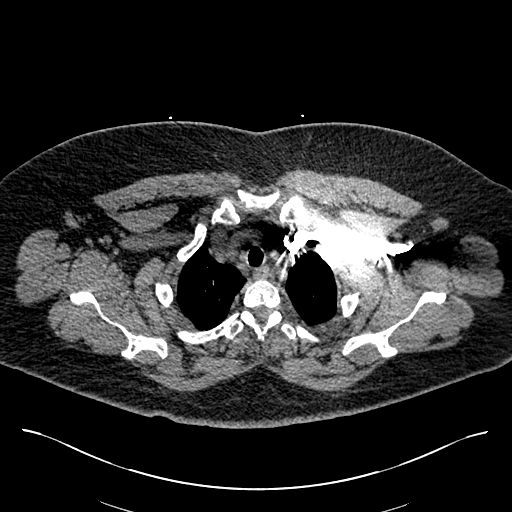
[im 256/285  lung]
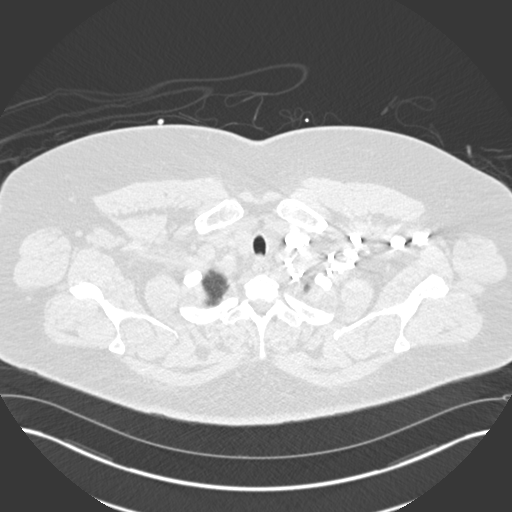
[im 270/285  mediastinal]
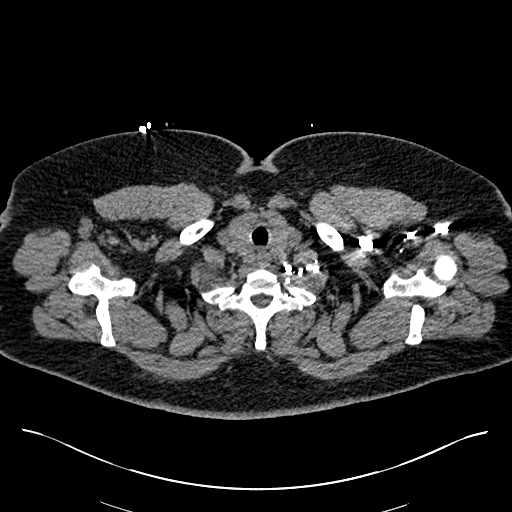

[18 of 36 positions shown; findings below may reference images not displayed]

FINDINGS: Cardiovascular: There is no demonstrable pulmonary embolus. There is
no thoracic aortic aneurysm or dissection. The contrast bolus in the
aorta is less than optimal for assessment for potential dissection.
Visualized great vessels appear unremarkable. There is no
appreciable pericardial effusion or pericardial thickening.

Mediastinum/Nodes: Thyroid appears unremarkable. There are multiple
subcentimeter lymph nodes throughout each axillary region. There are
also subcentimeter mediastinal lymph nodes. There is no demonstrable
adenopathy by size criteria. No esophageal lesions are appreciable.

Lungs/Pleura: There are scattered areas of mild atelectatic change.
There is no edema or consolidation. No pleural effusion or pleural
thickening evident. On axial slice 78 series 6, there is a 5 mm
nodular opacity abutting the pleura in the inferior lingula. On
axial slice 66 series 6, there is a 4 mm nodular opacity in the
medial segment right middle lobe.

Upper Abdomen: In the visualized upper abdomen, there is mild reflux
of contrast into the inferior vena cava and hepatic veins.
Visualized upper abdominal structures otherwise appear unremarkable.

Musculoskeletal: There are no blastic or lytic bone lesions. No
chest wall lesions are evident. There is postoperative change in the
right breast region.

Review of the MIP images confirms the above findings.
IMPRESSION: 1. No demonstrable pulmonary embolus. No thoracic aortic aneurysm or
dissection. Note that the contrast bolus in the aorta is less than
optimal for assessment for potential dissection.

2. No lung edema or consolidation. Areas of mild atelectatic change.
Nodular opacities noted as summarized above, largest measuring 5 mm.
No follow-up needed if patient is low-risk (and has no known or
suspected primary neoplasm). Non-contrast chest CT can be considered
in 12 months if patient is high-risk. This recommendation follows
the consensus statement: Guidelines for Management of Incidental
Pulmonary Nodules Detected on CT Images: From the [HOSPITAL]

3. Multiple subcentimeter lymph nodes in the axillary regions
bilaterally. No adenopathy in the thoracic region by size criteria.

4. Mild reflux of contrast into the inferior vena cava and hepatic
veins, a finding that may be indicative of a degree of increase in
right heart pressure.

5.  Postoperative change in right breast.

## 2018-08-20 ENCOUNTER — Other Ambulatory Visit: Payer: Self-pay | Admitting: Family Medicine

## 2018-09-18 ENCOUNTER — Encounter (HOSPITAL_COMMUNITY): Payer: Self-pay | Admitting: Emergency Medicine

## 2018-09-18 ENCOUNTER — Other Ambulatory Visit: Payer: Self-pay

## 2018-09-18 ENCOUNTER — Ambulatory Visit: Payer: Self-pay | Admitting: Family Medicine

## 2018-09-18 ENCOUNTER — Telehealth (HOSPITAL_COMMUNITY): Payer: Self-pay | Admitting: Emergency Medicine

## 2018-09-18 ENCOUNTER — Ambulatory Visit (HOSPITAL_COMMUNITY)
Admission: EM | Admit: 2018-09-18 | Discharge: 2018-09-18 | Disposition: A | Discharge status: Home / Self Care | Payer: BLUE CROSS/BLUE SHIELD | Attending: Family Medicine | Admitting: Family Medicine

## 2018-09-18 DIAGNOSIS — R079 Chest pain, unspecified: Secondary | ICD-10-CM | POA: Insufficient documentation

## 2018-09-18 DIAGNOSIS — R002 Palpitations: Secondary | ICD-10-CM | POA: Insufficient documentation

## 2018-09-18 LAB — CBC WITH DIFFERENTIAL/PLATELET
Abs Immature Granulocytes: 0.03 10*3/uL (ref 0.00–0.07)
Basophils Absolute: 0 10*3/uL (ref 0.0–0.1)
Basophils Relative: 0 %
EOS ABS: 0.2 10*3/uL (ref 0.0–0.5)
Eosinophils Relative: 2 %
HCT: 46.1 % — ABNORMAL HIGH (ref 36.0–46.0)
Hemoglobin: 14.5 g/dL (ref 12.0–15.0)
Immature Granulocytes: 0 %
Lymphocytes Relative: 26 %
Lymphs Abs: 2.9 10*3/uL (ref 0.7–4.0)
MCH: 25.9 pg — ABNORMAL LOW (ref 26.0–34.0)
MCHC: 31.5 g/dL (ref 30.0–36.0)
MCV: 82.3 fL (ref 80.0–100.0)
Monocytes Absolute: 0.8 10*3/uL (ref 0.1–1.0)
Monocytes Relative: 7 %
Neutro Abs: 7.2 10*3/uL (ref 1.7–7.7)
Neutrophils Relative %: 65 %
Platelets: 267 10*3/uL (ref 150–400)
RBC: 5.6 MIL/uL — ABNORMAL HIGH (ref 3.87–5.11)
RDW: 14.2 % (ref 11.5–15.5)
WBC: 11.1 10*3/uL — ABNORMAL HIGH (ref 4.0–10.5)
nRBC: 0 % (ref 0.0–0.2)

## 2018-09-18 LAB — COMPREHENSIVE METABOLIC PANEL
ALT: 16 U/L (ref 0–44)
AST: 16 U/L (ref 15–41)
Albumin: 3.6 g/dL (ref 3.5–5.0)
Alkaline Phosphatase: 89 U/L (ref 38–126)
Anion gap: 7 (ref 5–15)
BUN: 9 mg/dL (ref 6–20)
CO2: 24 mmol/L (ref 22–32)
Calcium: 8.8 mg/dL — ABNORMAL LOW (ref 8.9–10.3)
Chloride: 107 mmol/L (ref 98–111)
Creatinine, Ser: 0.67 mg/dL (ref 0.44–1.00)
GFR calc non Af Amer: >60 mL/min (ref >60)
Glucose, Bld: 121 mg/dL — ABNORMAL HIGH (ref 70–99)
Potassium: 3.1 mmol/L — ABNORMAL LOW (ref 3.5–5.1)
Sodium: 138 mmol/L (ref 135–145)
Total Bilirubin: 0.4 mg/dL (ref 0.3–1.2)
Total Protein: 6.9 g/dL (ref 6.5–8.1)

## 2018-09-18 LAB — TSH: TSH: 4.498 u[IU]/mL (ref 0.350–4.500)

## 2018-09-18 MED ORDER — POTASSIUM CHLORIDE CRYS ER 20 MEQ PO TBCR: 40.0000 meq | EXTENDED_RELEASE_TABLET | Freq: Every day | ORAL | 0 refills | Status: DC

## 2018-09-18 NOTE — Discharge Instructions (Signed)
Will notify you if any concerning results with your labs. You may monitor your results on your MyChart online as well.   Please go to the ER if develop any worsening or persistent symptoms, shortness of breath , arm or jaw pain, nausea, sweating or if your symptoms don't improve with taking of your metoprolol.

## 2018-09-18 NOTE — ED Provider Notes (Signed)
[facility]    CSN: [id number] Arrival date & time: 09/18/18  0844     History   Chief Complaint Chief Complaint  Patient presents with  . Palpitations    HPI Emily Maldonado is a 43 y.o. female.   [name] presents with complaints of chest tightness which is intermittent. Started last night at 11 pm and has been intermittent since. States it can occur with activity or with rest. Can last up to 2 minutes at a time. States feels exactly like what she was experiencing when diagnosed with PVC's, which included a cardiac workup and cardiac cath- 12/2017. 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 She has been taking her synthroid. Takes metoprolol daily, took last yesterday at 10p. No arm pain, no nausea, no shortness of breath , no jaw pain. Hx of htn and parents with heart disease. She does smoke approximately 1ppd.     ROS per HPI, negative if not otherwise mentioned.      Past Medical History:  Diagnosis Date  . Abnormal Pap smear of cervix   . Anemia    b12  . Anxiety   . B12 deficiency anemia   . Childhood asthma   . Former tobacco use   . Hypertension   . Hypothyroidism   . Migraines    aura  . Morbid obesity (HCC)   . Ovarian cyst   . Sleep apnea    Use C-pap     Patient Active Problem List   Diagnosis Date Noted  . 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 Pulmonary nodule - needs repeat 12/2018 12/19/2017  . Low vitamin B12 level 12/05/2017  . PVC's (premature ventricular contractions)   . Morbid obesity (HCC)   . Acne 08/22/2017  . Hypothyroidism 08/22/2017  . Hypertension 08/22/2017    Past Surgical History:  Procedure Laterality Date  . BREAST BIOPSY Left 2014  . BREAST EXCISIONAL BIOPSY Right   . CERVICAL BIOPSY  W/ LOOP ELECTRODE EXCISION    . LEFT HEART CATH AND CORONARY ANGIOGRAPHY N/A 12/04/2017   Procedure: LEFT HEART CATH AND CORONARY ANGIOGRAPHY;  Surgeon: Kathleene Hazel, MD;   Location: [facility];  Service: Cardiovascular;  Laterality: N/A;  . TONSILLECTOMY    . TUBAL LIGATION      OB History    Gravida  5   Para      Term      Preterm      AB  2   Living        SAB  1   TAB  1   Ectopic      Multiple      Live Births  3            Home Medications    Prior to Admission medications   Medication Sig Start Date End Date Taking? Authorizing Provider  adapalene (DIFFERIN) 0.1 % cream Apply topically at bedtime. 08/22/17   Ardith Dark, MD  aspirin-acetaminophen-caffeine [facility] MIGRAINE) [phone] MG tablet Take 2 tablets by mouth at bedtime as needed for headache or migraine.    [provider]  cyanocobalamin (,VITAMIN B-12,) 1000 MCG/ML injection Inject 1 mL (1,000 mcg total) into the muscle daily. IM Daily x 6 days, then IM weekly x 1 month, then monthly 12/06/17   Rodolph Bong, MD  ibuprofen (ADVIL,MOTRIN) 200 MG tablet Take 600 mg by mouth daily as needed for headache.  [provider]  levothyroxine (SYNTHROID, LEVOTHROID) 100 MCG tablet TAKE 1 TABLET BY MOUTH DAILY BEFORE BREAKFAST 08/20/18   Ardith Dark, MD  LYSINE PO Take by mouth.    [provider]  Magnesium 500 MG CAPS Take 500 mg by mouth daily.    [provider]  metoprolol succinate (TOPROL-XL) 25 MG 24 hr tablet Take 1 tablet (25 mg total) by mouth daily. 08/26/17   Ardith Dark, MD  omeprazole (PRILOSEC) 40 MG capsule TAKE ONE CAPSULE BY MOUTH DAILY 08/20/18   Ardith Dark, MD  potassium chloride SA (K-DUR,KLOR-CON) 20 MEQ tablet Take 2 tablets (40 mEq total) by mouth daily for 5 days. 09/18/18 09/23/18  Georgetta Haber, NP  traMADol (ULTRAM) 50 MG tablet Take 1-2 tablets (50-100 mg total) by mouth every 6 (six) hours as needed. 12/05/17   Rodolph Bong, MD    Family History Family History  Problem Relation Age of Onset  . Breast cancer Mother 51  . Heart failure Mother   .  Atrial fibrillation Mother   . Cervical cancer Mother   . Diabetes Mother   . Hypertension Mother   . Heart failure Father   . Hypertension Father   . Heart failure Maternal Grandmother   . Atrial fibrillation Maternal Grandmother   . Stroke Maternal Grandmother   . Uterine cancer Maternal Grandmother   . Diabetes Brother   . Hypertension Brother   . Uterine cancer Maternal Aunt     Social History Social History   Tobacco Use  . Smoking status: Current Every Day Smoker    Packs/day: 1.00    Types: Cigarettes  . Smokeless tobacco: Never Used  Substance Use Topics  . Alcohol use: Not Currently  . Drug use: No     Allergies   Penicillins; Shellfish allergy; and Zithromax [azithromycin]   Review of Systems Review of Systems   Physical Exam Triage Vital Signs ED Triage Vitals  Enc Vitals Group     BP 09/18/18 0904 (!) 137/95     Pulse Rate 09/18/18 0904 91     Resp 09/18/18 0904 16     Temp 09/18/18 0904 97.6 F (36.4 C)     Temp Source 09/18/18 0904 Oral     SpO2 09/18/18 0904 95 %     Weight --      Height --      Head Circumference --      Peak Flow --      Pain Score 09/18/18 0901 0     Pain Loc --      Pain Edu? --      Excl. in GC? --    No data found.  Updated Vital Signs BP (!) 137/95 (BP Location: Left Arm)   Pulse 91   Temp 97.6 F (36.4 C) (Oral)   Resp 16   SpO2 95%   Visual Acuity Right Eye Distance:   Left Eye Distance:   Bilateral Distance:    Right Eye Near:   Left Eye Near:    Bilateral Near:     Physical Exam Constitutional:      General: She is not in acute distress.    Appearance: She is well-developed.  Cardiovascular:     Rate and Rhythm: Normal rate and regular rhythm.     Pulses: Normal pulses.     Heart sounds: Normal heart sounds. No murmur.     Comments: Regular rhythm through exam without audible skipped beats or irregular  rhythm Pulmonary:     Effort: Pulmonary effort is normal.     Breath sounds: Normal  breath sounds.  Skin:    General: Skin is warm and dry.  Neurological:     Mental Status: She is alert and oriented to person, place, and time.    EKG: NSR rate of 93 . Previous EKG was available for review without acute changes. No stwave changes as interpreted by me.    Reviewed ekg with attending physician Dr.Renato Gailsmpty.   UC Treatments / Results  Labs (all labs ordered are listed, but only abnormal results are displayed) Labs Reviewed  COMPREHENSIVE METABOLIC PANEL - Abnormal; Notable for the following components:      Result Value   Potassium 3.1 (*)    Glucose, Bld 121 (*)    Calcium 8.8 (*)    All other components within normal limits  CBC WITH DIFFERENTIAL/PLATELET - Abnormal; Notable for the following components:   WBC 11.1 (*)    RBC 5.60 (*)    HCT 46.1 (*)    MCH 25.9 (*)    All other components within normal limits  TSH    EKG None  Radiology No results found.  Procedures Procedures (including critical care time)  Medications Ordered in UC Medications - No data to display  Initial Impression / Assessment and Plan / UC Course  I have reviewed the triage vital signs and the nursing notes.  Pertinent labs & imaging results that were available during my care of the patient were reviewed by me and considered in my medical decision making (see chart for details).     Patient with reassuring evaluation and cardiac cath less than a year ago with same symptoms. ekg reassuring here today. Discussed that with chest tightness troponin is still appropriate, she states she would like to have her labs checked first as potassium or thyroid irregularities have historically been cause of her symptoms. States she will go to ER if her labs are normal or if she has any worsening of symptoms.    K+ of 3.1. patient tolerating PO intake and no vomiting or diarrhea. Oral supplementation recommended and dicussed with patient. As she is having symptoms did recommend recheck in 48  hours to ensure improvement. Return precautions provided. Patient verbalized understanding and agreeable to plan.    Final Clinical Impressions(s) / UC Diagnoses   Final diagnoses:  Heart palpitations  Chest pain, unspecified type     Discharge Instructions     Will notify you if any concerning results with your labs. You may monitor your results on your MyChart online as well.   Please go to the ER if develop any worsening or persistent symptoms, shortness of breath , arm or jaw pain, nausea, sweating or if your symptoms don't improve with taking of your metoprolol.      ED Prescriptions    Medication Sig Dispense Auth. Provider   potassium chloride SA (K-DUR,KLOR-CON) 20 MEQ tablet Take 2 tablets (40 mEq total) by mouth daily for 5 days. 10 tablet Georgetta Haber, NP     Controlled Substance Prescriptions [place] Controlled Substance Registry consulted? Not Applicable   Georgetta Haber, NP 09/18/18 1106

## 2018-09-18 NOTE — Telephone Encounter (Signed)
FYI

## 2018-09-18 NOTE — Telephone Encounter (Signed)
Discussed patient's potassium level of 3.1 today, encouraged 40 mEq oral potassium for the next 5 days, to return to be seen for recheck in 2 days. Any worsening of symptoms to go to ER for recheck and possibly IV potassium. Patient tolerating oral intake, no vomiting or diarrhea. Patient verbalized understanding and agreeable to plan.      Georgetta Haber, NP 09/18/2018 11:03 AM

## 2018-09-18 NOTE — Telephone Encounter (Signed)
Pt. Reports she has a history of PVC's. States she started having them while she was at work last night. Denies any shortness of breath, nausea, dizziness. Has "chest tightness - not really pain, when I'm having them." States it could be my electrolytes or my thyroid." No availability in the office. Pt. Will go to [facility].  Reason for Disposition . [1] Chest pain lasting <= 5 minutes AND [2] NO chest pain or cardiac symptoms now(Exceptions: pains lasting a few seconds)  Answer Assessment - Initial Assessment Questions 1. LOCATION: "Where does it hurt?"        Middle of chest 2. RADIATION: "Does the pain go anywhere else?" (e.g., into neck, jaw, arms, back)     Shoot neck 3. ONSET: "When did the chest pain begin?" (Minutes, hours or days)      Last night  4. PATTERN "Does the pain come and go, or has it been constant since it started?"  "Does it get worse with exertion?"      Comes and goes 5. DURATION: "How long does it last" (e.g., seconds, minutes, hours)      Having PVC's 6. SEVERITY: "How bad is the pain?"  (e.g., Scale 1-10; mild, moderate, or severe)    - MILD (1-3): doesn't interfere with normal activities     - MODERATE (4-7): interferes with normal activities or awakens from sleep    - SEVERE (8-10): excruciating pain, unable to do any normal activities       7 7. CARDIAC RISK FACTORS: "Do you have any history of heart problems or risk factors for heart disease?" (e.g., prior heart attack, angina; high blood pressure, diabetes, being overweight, high cholesterol, smoking, or strong family history of heart disease)     Has PVC'S 8. PULMONARY RISK FACTORS: "Do you have any history of lung disease?"  (e.g., blood clots in lung, asthma, emphysema, birth control pills)     No 9. CAUSE: "What do you think is causing the chest pain?"     PVC'S 10. OTHER SYMPTOMS: "Do you have any other symptoms?" (e.g., dizziness, nausea, vomiting, sweating, fever, difficulty breathing, cough)    No 11. PREGNANCY: "Is there any chance you are pregnant?" "When was your last menstrual period?"       Yes  Protocols used: CHEST PAIN-A-AH

## 2018-09-18 NOTE — ED Triage Notes (Signed)
PT C/O: pt c/o intermittent palpitations onset 2 weeks +++  Reports hx of thyroid issues, hypokalemia, PVCs   DENIES: SOB, CP, jaw pain, diaphoresis, nv  TAKING MEDS: Has not had her Metoprolol today  A&O x4... NAD... Ambulatory

## 2018-09-18 NOTE — Telephone Encounter (Signed)
See note

## 2018-09-19 ENCOUNTER — Other Ambulatory Visit: Payer: Self-pay | Admitting: Family Medicine

## 2018-09-21 ENCOUNTER — Ambulatory Visit: Payer: BLUE CROSS/BLUE SHIELD | Admitting: Family Medicine

## 2018-09-21 ENCOUNTER — Encounter: Payer: Self-pay | Admitting: Family Medicine

## 2018-09-21 ENCOUNTER — Other Ambulatory Visit: Payer: Self-pay

## 2018-09-21 VITALS — BP 150/90 | HR 91 | Temp 98.3°F | Ht 65.25 in | Wt 273.4 lb

## 2018-09-21 DIAGNOSIS — R05 Cough: Secondary | ICD-10-CM

## 2018-09-21 DIAGNOSIS — E876 Hypokalemia: Secondary | ICD-10-CM | POA: Diagnosis not present

## 2018-09-21 DIAGNOSIS — J0110 Acute frontal sinusitis, unspecified: Secondary | ICD-10-CM | POA: Diagnosis not present

## 2018-09-21 DIAGNOSIS — R059 Cough, unspecified: Secondary | ICD-10-CM

## 2018-09-21 LAB — CBC WITH DIFFERENTIAL/PLATELET
Basophils Absolute: 0.1 10*3/uL (ref 0.0–0.1)
Basophils Relative: 0.7 % (ref 0.0–3.0)
Eosinophils Absolute: 0.2 10*3/uL (ref 0.0–0.7)
Eosinophils Relative: 2.2 % (ref 0.0–5.0)
HEMATOCRIT: 45.4 % (ref 36.0–46.0)
Hemoglobin: 15.2 g/dL — ABNORMAL HIGH (ref 12.0–15.0)
Lymphocytes Relative: 28.2 % (ref 12.0–46.0)
Lymphs Abs: 2.5 10*3/uL (ref 0.7–4.0)
MCHC: 33.5 g/dL (ref 30.0–36.0)
MCV: 80.8 fl (ref 78.0–100.0)
Monocytes Absolute: 0.7 10*3/uL (ref 0.1–1.0)
Monocytes Relative: 7.6 % (ref 3.0–12.0)
Neutro Abs: 5.5 10*3/uL (ref 1.4–7.7)
Neutrophils Relative %: 61.3 % (ref 43.0–77.0)
PLATELETS: 271 10*3/uL (ref 150.0–400.0)
RBC: 5.62 Mil/uL — ABNORMAL HIGH (ref 3.87–5.11)
RDW: 14.9 % (ref 11.5–15.5)
WBC: 8.9 10*3/uL (ref 4.0–10.5)

## 2018-09-21 LAB — BASIC METABOLIC PANEL
BUN: 8 mg/dL (ref 6–23)
CALCIUM: 8.5 mg/dL (ref 8.4–10.5)
CO2: 27 mEq/L (ref 19–32)
Chloride: 103 mEq/L (ref 96–112)
Creatinine, Ser: 0.57 mg/dL (ref 0.40–1.20)
GFR: 114.82 mL/min (ref >60.00)
Glucose, Bld: 93 mg/dL (ref 70–99)
Potassium: 4 mEq/L (ref 3.5–5.1)
Sodium: 137 mEq/L (ref 135–145)

## 2018-09-21 LAB — POC INFLUENZA A&B (BINAX/QUICKVUE)
Influenza A, POC: NEGATIVE
Influenza B, POC: NEGATIVE

## 2018-09-21 MED ORDER — DOXYCYCLINE HYCLATE 100 MG PO TABS: 100.0000 mg | ORAL_TABLET | Freq: Two times a day (BID) | ORAL | 0 refills | Status: AC

## 2018-09-21 MED ORDER — GUAIFENESIN-CODEINE 100-10 MG/5ML PO SOLN: 5.0000 mL | Freq: Four times a day (QID) | ORAL | 0 refills | Status: DC | PRN

## 2018-09-21 NOTE — Progress Notes (Signed)
Phone [phone]   Subjective:  Emily Maldonado is a 43 y.o. year old very pleasant female patient who presents for/with See problem oriented charting ROS-no fever, shortness of breath, chest pain since yesterday.  No nausea or vomiting.  Past Medical History-  Patient Active Problem List   Diagnosis Date Noted  . Hypothyroidism 08/22/2017    Priority: Medium  . Hypertension 08/22/2017    Priority: Medium  . 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 Pulmonary nodule - needs repeat 12/2018 12/19/2017  . Low vitamin B12 level 12/05/2017  . PVC's (premature ventricular contractions)   . Morbid obesity (HCC)   . Acne 08/22/2017    Medications- reviewed and updated Current Outpatient Medications  Medication Sig Dispense Refill  . aspirin-acetaminophen-caffeine (EXCEDRIN MIGRAINE) 250-250-65 MG tablet Take 2 tablets by mouth at bedtime as needed for headache or migraine.    . cyanocobalamin (,VITAMIN B-12,) 1000 MCG/ML injection Inject 1 mL (1,000 mcg total) into the muscle daily. IM Daily x 6 days, then IM weekly x 1 month, then monthly 25 mL 0  . ibuprofen (ADVIL,MOTRIN) 200 MG tablet Take 600 mg by mouth daily as needed for headache.    . levothyroxine (SYNTHROID, LEVOTHROID) 100 MCG tablet TAKE 1 TABLET BY MOUTH DAILY BEFORE BREAKFAST 30 tablet 5  . LYSINE PO Take by mouth.    . Magnesium 500 MG CAPS Take 500 mg by mouth daily.    . metoprolol succinate (TOPROL-XL) 25 MG 24 hr tablet TAKE 1 TABLET BY MOUTH ONCE DAILY 90 tablet 3  . omeprazole (PRILOSEC) 40 MG capsule TAKE ONE CAPSULE BY MOUTH DAILY 30 capsule 5  . potassium chloride SA (K-DUR,KLOR-CON) 20 MEQ tablet Take 2 tablets (40 mEq total) by mouth daily for 5 days. 10 tablet 0  . traMADol (ULTRAM) 50 MG tablet Take 1-2 tablets (50-100 mg total) by mouth every 6 (six) hours as needed. 20 tablet 0  . adapalene (DIFFERIN) 0.1 % cream Apply topically at bedtime. (Patient not taking: Reported on  09/21/2018) 45 g 0  . doxycycline (VIBRA-TABS) 100 MG tablet Take 1 tablet (100 mg total) by mouth 2 (two) times daily for 7 days. 14 tablet 0  . guaiFENesin-codeine 100-10 MG/5ML syrup Take 5 mLs by mouth every 6 (six) hours as needed for cough. 120 mL 0   No current facility-administered medications for this visit.      Objective:  BP (!) 150/90 (BP Location: Left Arm, Patient Position: Sitting, Cuff Size: Large)   Pulse 91   Temp 98.3 F (36.8 C) (Oral)   Ht 5' 5.25" (1.657 m)   Wt 273 lb 6.4 oz (124 kg)   LMP 08/26/2018   SpO2 98%   BMI 45.15 kg/m  Gen: NAD, resting comfortably Tympanic membrane's normal.  Nasal turbinates erythematous with yellow discharge.  Pharynx largely normal. CV: RRR no murmurs rubs or gallops Lungs: CTAB no crackles, wheeze, rhonchi Ext: no edema Skin: warm, dry  Results for orders placed or performed in visit on 09/21/18 (from the past 24 hour(s))  POC Influenza A&B(BINAX/QUICKVUE)     Status: None   Collection Time: 09/21/18  9:12 AM  Result Value Ref Range   Influenza A, POC Negative Negative   Influenza B, POC Negative Negative      Assessment and Plan   Cough/hypokalemia/headache S: Patient was seen at urgent care on the 13th for chest tightness which is intermittent.  She stated it felt exactly like it did when she  was diagnosed with PVCs previously-prior cardiac work-up including catheterization June 2019.  She was also found to have Hashimoto's/hypothyroidism at that time.  She has been compliant with her levothyroxine.  She denied arm pain, nausea, shortness of breath, jaw pain.  She smokes 1 pack/day.  Patient with potassium of 3.1 at the urgent care.  Oral supplementation was recommended and discussed with patient.  He was recommended to have 48-hour recheck and thus returned at visit today  She also reports having a headache and cough for 3-4 days.  She has white mixed with yellow drainage from her nose.  She has sinus tenderness.  This  is typical of sinus infections for her.  Of note her blood pressure was elevated in the urgent care as well as here today.  Heart rate is high normal.  She does have ear pressure  Flu test was negative today.  She has no known covid-19 contacts.  She reports no episodes of chest pain since getting a potassium and for a few doses- no chest pain since yesterday at noon.  She states the chest pain is how her PVCs have presented in the past. A/P: Sinusitis- only 3 days of symptoms-does not meet criteria for bacterial sinusitis but in current environment we opted to treat.  If she is febrile or develops worsening symptoms-asked her to call us.  She walked in today.  I encouraged her not to walk in-in current environment From [facility] "  Patient Instructions   Health Maintenance Due  Topic Date Due  . INFLUENZA VACCINE - what was the date of your flu shot? 02/05/2018   Codeine cough syrup for cough/congestion-make sure to drink a full glass of water with this.  Do not drive for 8 hours after taking.  Please make sure to take metoprolol as soon as you get home-blood pressure was elevated today  Start doxycycline.  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  I would call health at work as well and inform them of current plan.  " See her work note as well  Lab/Order associations: Cough - Plan: POC Influenza A&B(BINAX/QUICKVUE), CBC with Differential/Platelet  Hypokalemia - Plan: Basic metabolic panel  Meds ordered this encounter  Medications  . guaiFENesin-codeine 100-10 MG/5ML syrup    Sig: Take 5 mLs by mouth every 6 (six) hours as needed for cough.    Dispense:  120 mL    Refill:  0  . doxycycline (VIBRA-TABS) 100 MG tablet    Sig: Take 1 tablet (100 mg total) by mouth 2 (two) times daily for 7 days.    Dispense:   14 tablet    Refill:  0  Of note-patient does not have recent history of sinusitis.  Any prior episodes have fully resolved- this is a new acute issue.  Return precautions advised.  Tana Conch, MD

## 2018-09-21 NOTE — Patient Instructions (Addendum)
Health Maintenance Due  Topic Date Due  . INFLUENZA VACCINE - what was the date of your flu shot? 02/05/2018   Codeine cough syrup for cough/congestion-make sure to drink a full glass of water with this.  Do not drive for 8 hours after taking.  Please make sure to take metoprolol as soon as you get home-blood pressure was elevated today  Start doxycycline.  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  I would call health at work as well and inform them of current plan.

## 2018-09-22 NOTE — Telephone Encounter (Signed)
Noted. Looks like she was evaluated already for this.  Emily Maldonado. [name], MD 09/22/2018 8:35 AM

## 2018-11-16 ENCOUNTER — Ambulatory Visit (HOSPITAL_BASED_OUTPATIENT_CLINIC_OR_DEPARTMENT_OTHER)
Admission: RE | Admit: 2018-11-16 | Discharge: 2018-11-16 | Disposition: A | Discharge status: Home / Self Care | Payer: BLUE CROSS/BLUE SHIELD | Source: Ambulatory Visit | Attending: Physician Assistant | Admitting: Physician Assistant

## 2018-11-16 ENCOUNTER — Other Ambulatory Visit: Payer: Self-pay

## 2018-11-16 ENCOUNTER — Emergency Department (HOSPITAL_COMMUNITY)
Admission: EM | Admit: 2018-11-16 | Discharge: 2018-11-16 | Disposition: A | Discharge status: Home / Self Care | Payer: BLUE CROSS/BLUE SHIELD | Attending: Emergency Medicine | Admitting: Emergency Medicine

## 2018-11-16 ENCOUNTER — Encounter (HOSPITAL_COMMUNITY): Payer: Self-pay | Admitting: *Deleted

## 2018-11-16 ENCOUNTER — Telehealth: Payer: Self-pay

## 2018-11-16 ENCOUNTER — Other Ambulatory Visit (HOSPITAL_COMMUNITY): Payer: Self-pay | Admitting: *Deleted

## 2018-11-16 ENCOUNTER — Emergency Department (HOSPITAL_COMMUNITY): Payer: BLUE CROSS/BLUE SHIELD

## 2018-11-16 DIAGNOSIS — F17210 Nicotine dependence, cigarettes, uncomplicated: Secondary | ICD-10-CM | POA: Diagnosis not present

## 2018-11-16 DIAGNOSIS — I4891 Unspecified atrial fibrillation: Secondary | ICD-10-CM | POA: Diagnosis not present

## 2018-11-16 DIAGNOSIS — Z79899 Other long term (current) drug therapy: Secondary | ICD-10-CM | POA: Diagnosis not present

## 2018-11-16 DIAGNOSIS — I480 Paroxysmal atrial fibrillation: Secondary | ICD-10-CM | POA: Diagnosis not present

## 2018-11-16 DIAGNOSIS — R000 Tachycardia, unspecified: Secondary | ICD-10-CM | POA: Diagnosis not present

## 2018-11-16 DIAGNOSIS — E039 Hypothyroidism, unspecified: Secondary | ICD-10-CM | POA: Insufficient documentation

## 2018-11-16 DIAGNOSIS — I10 Essential (primary) hypertension: Secondary | ICD-10-CM | POA: Diagnosis not present

## 2018-11-16 DIAGNOSIS — R079 Chest pain, unspecified: Secondary | ICD-10-CM | POA: Diagnosis not present

## 2018-11-16 LAB — CBC WITH DIFFERENTIAL/PLATELET
Abs Immature Granulocytes: 0.03 10*3/uL (ref 0.00–0.07)
Basophils Absolute: 0.1 10*3/uL (ref 0.0–0.1)
Basophils Relative: 1 %
Eosinophils Absolute: 0.2 10*3/uL (ref 0.0–0.5)
Eosinophils Relative: 2 %
HCT: 52.6 % — ABNORMAL HIGH (ref 36.0–46.0)
Hemoglobin: 16.6 g/dL — ABNORMAL HIGH (ref 12.0–15.0)
Immature Granulocytes: 0 %
Lymphocytes Relative: 39 %
Lymphs Abs: 4.9 10*3/uL — ABNORMAL HIGH (ref 0.7–4.0)
MCH: 26.9 pg (ref 26.0–34.0)
MCHC: 31.6 g/dL (ref 30.0–36.0)
MCV: 85.1 fL (ref 80.0–100.0)
Monocytes Absolute: 1 10*3/uL (ref 0.1–1.0)
Monocytes Relative: 8 %
Neutro Abs: 6.3 10*3/uL (ref 1.7–7.7)
Neutrophils Relative %: 50 %
Platelets: 271 10*3/uL (ref 150–400)
RBC: 6.18 MIL/uL — ABNORMAL HIGH (ref 3.87–5.11)
RDW: 14.9 % (ref 11.5–15.5)
WBC: 12.4 10*3/uL — ABNORMAL HIGH (ref 4.0–10.5)
nRBC: 0 % (ref 0.0–0.2)

## 2018-11-16 LAB — BASIC METABOLIC PANEL
Anion gap: 16 — ABNORMAL HIGH (ref 5–15)
BUN: 12 mg/dL (ref 6–20)
CO2: 22 mmol/L (ref 22–32)
Calcium: 9.6 mg/dL (ref 8.9–10.3)
Chloride: 100 mmol/L (ref 98–111)
Creatinine, Ser: 0.81 mg/dL (ref 0.44–1.00)
GFR calc Af Amer: >60 mL/min (ref >60)
GFR calc non Af Amer: >60 mL/min (ref >60)
Glucose, Bld: 105 mg/dL — ABNORMAL HIGH (ref 70–99)
Potassium: 3.5 mmol/L (ref 3.5–5.1)
Sodium: 138 mmol/L (ref 135–145)

## 2018-11-16 LAB — TSH: TSH: 2.304 u[IU]/mL (ref 0.350–4.500)

## 2018-11-16 LAB — MAGNESIUM: Magnesium: 1.6 mg/dL — ABNORMAL LOW (ref 1.7–2.4)

## 2018-11-16 LAB — TROPONIN I: Troponin I: <0.03 ng/mL (ref <0.03)

## 2018-11-16 MED ORDER — SODIUM CHLORIDE 0.9 % IV BOLUS
1000.0000 mL | Freq: Once | INTRAVENOUS | Status: AC
  Administered 2018-11-16: 03:00:00 1000 mL via INTRAVENOUS

## 2018-11-16 MED ORDER — APIXABAN 5 MG PO TABS: 5.0000 mg | ORAL_TABLET | Freq: Two times a day (BID) | ORAL | 6 refills | Status: DC

## 2018-11-16 MED ORDER — APIXABAN 5 MG PO TABS: 5.0000 mg | ORAL_TABLET | Freq: Two times a day (BID) | ORAL | 0 refills | Status: DC

## 2018-11-16 MED ORDER — DILTIAZEM LOAD VIA INFUSION
20.0000 mg | Freq: Once | INTRAVENOUS | Status: AC
  Administered 2018-11-16: 20 mg via INTRAVENOUS
  Filled 2018-11-16: qty 20

## 2018-11-16 MED ORDER — DILTIAZEM HCL-DEXTROSE 100-5 MG/100ML-% IV SOLN (PREMIX)
5.0000 mg/h | INTRAVENOUS | Status: DC
  Administered 2018-11-16: 5 mg/h via INTRAVENOUS
  Filled 2018-11-16: qty 100

## 2018-11-16 MED ORDER — MAGNESIUM SULFATE IN D5W 1-5 GM/100ML-% IV SOLN
1.0000 g | INTRAVENOUS | Status: AC
  Administered 2018-11-16: 04:00:00 1 g via INTRAVENOUS
  Filled 2018-11-16: qty 100

## 2018-11-16 MED ORDER — SODIUM CHLORIDE 0.9 % IV BOLUS: 1000.0000 mL | Freq: Once | INTRAVENOUS | Status: DC

## 2018-11-16 MED ORDER — METOPROLOL SUCCINATE ER 50 MG PO TB24: 50.0000 mg | ORAL_TABLET | Freq: Every day | ORAL | 6 refills | Status: DC

## 2018-11-16 NOTE — ED Provider Notes (Signed)
[facility]ENCY [facility]PART[facility] Provider Note   CSN: [id number] Arrival date & time: 11/16/18  0141    History   Chief Complaint No chief complaint on file.   HPI Emily Maldonado is a 43 y.o. female.     Patient with PMH of hypothyroidism, and palpitation presents to the ED with a chief complaint of palpitations.  She states that she was lying in her bed tonight and felt a heaviness in her chest.  She denies any pain, but has had some SOB.  She denies any hx of afib, but states she has had palpitations in the past and has been evaluated for these before. She states that she may have had palpitations in the past month. Prior episodes thought to have been 2/2 electrolyte abnormalities.  She denies any changes in her medications, denies any stimulant uses, denies any infectious symptoms.  She denies fever, chills, cough, n/v.  She has some baseline diarrhea, which is normal for her.  Denies any dysuria.  Denies any recent long travel, hx of PE, leg swelling, or immobilization.  The history is provided by the patient. No language interpreter was used.    Past Medical History:  Diagnosis Date  . Abnormal Pap smear of cervix   . Anemia    b12  . Anxiety   . B12 deficiency anemia   . Childhood asthma   . Former tobacco use   . Hypertension   . Hypothyroidism   . Migraines    aura  . Morbid obesity (HCC)   . Ovarian cyst   . Sleep apnea    Use C-pap     Patient Active Problem List   Diagnosis Date Noted  . 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 Pulmonary nodule - needs repeat 12/2018 12/19/2017  . Low vitamin B12 level 12/05/2017  . PVC's (premature ventricular contractions)   . Morbid obesity (HCC)   . Acne 08/22/2017  . Hypothyroidism 08/22/2017  . Hypertension 08/22/2017    Past Surgical History:  Procedure Laterality Date  . BREAST BIOPSY Left 2014  . BREAST EXCISIONAL BIOPSY Right   . CERVICAL BIOPSY  W/ LOOP ELECTRODE EXCISION     . LEFT HEART CATH AND CORONARY ANGIOGRAPHY N/A 12/04/2017   Procedure: LEFT HEART CATH AND CORONARY ANGIOGRAPHY;  Surgeon: Kathleene HazelMcAlhany, Christopher D, MD;  Location: [facility] INVASIVE CV LAB;  Service: Cardiovascular;  Laterality: N/A;  . TONSILLECTOMY    . TUBAL LIGATION       OB History    Gravida  5   Para      Term      Preterm      AB  2   Living        SAB  1   TAB  1   Ectopic      Multiple      Live Births  3            Home Medications    Prior to Admission medications   Medication Sig Start Date End Date Taking? Authorizing Provider  adapalene (DIFFERIN) 0.1 % cream Apply topically at bedtime. Patient not taking: Reported on 09/21/2018 08/22/17   Ardith DarkParker, Caleb M, MD  aspirin-acetaminophen-caffeine [facility](EXCEDRIN MIGRAINE) [phone] MG tablet Take 2 tablets by mouth at bedtime as needed for headache or migraine.    [provider]  cyanocobalamin (,VITAMIN B-12,) 1000 MCG/ML injection Inject 1 mL (1,000 mcg total) into the muscle daily. 1000mcg IM Daily x 6 days, then  IM weekly x 1 month, then monthly 12/06/17   Rodolph Bong, MD  guaiFENesin-codeine 100-10 MG/5ML syrup Take 5 mLs by mouth every 6 (six) hours as needed for cough. 09/21/18   Shelva Majestic, MD  ibuprofen (ADVIL,MOTRIN) 200 MG tablet Take 600 mg by mouth daily as needed for headache.    [provider]  levothyroxine (SYNTHROID, LEVOTHROID) 100 MCG tablet TAKE 1 TABLET BY MOUTH DAILY BEFORE BREAKFAST 08/20/18   Ardith Dark, MD  LYSINE PO Take by mouth.    [provider]  Magnesium 500 MG CAPS Take 500 mg by mouth daily.    [provider]  metoprolol succinate (TOPROL-XL) 25 MG 24 hr tablet TAKE 1 TABLET BY MOUTH ONCE DAILY 09/21/18   Ardith Dark, MD  omeprazole (PRILOSEC) 40 MG capsule TAKE ONE CAPSULE BY MOUTH DAILY 08/20/18   Ardith Dark, MD  potassium chloride SA (K-DUR,KLOR-CON) 20 MEQ tablet Take 2 tablets (40 mEq total) by mouth daily  for 5 days. 09/18/18 09/23/18  Georgetta Haber, NP  traMADol (ULTRAM) 50 MG tablet Take 1-2 tablets (50-100 mg total) by mouth every 6 (six) hours as needed. 12/05/17   Rodolph Bong, MD    Family History Family History  Problem Relation Age of Onset  . Breast cancer Mother 50  . Heart failure Mother   . Atrial fibrillation Mother   . Cervical cancer Mother   . Diabetes Mother   . Hypertension Mother   . Heart failure Father   . Hypertension Father   . Heart failure Maternal Grandmother   . Atrial fibrillation Maternal Grandmother   . Stroke Maternal Grandmother   . Uterine cancer Maternal Grandmother   . Diabetes Brother   . Hypertension Brother   . Uterine cancer Maternal Aunt     Social History Social History   Tobacco Use  . Smoking status: Current Every Day Smoker    Packs/day: 1.00    Types: Cigarettes  . Smokeless tobacco: Never Used  Substance Use Topics  . Alcohol use: Not Currently  . Drug use: No     Allergies   Penicillins; Shellfish allergy; and Zithromax [azithromycin]   Review of Systems Review of Systems  All other systems reviewed and are negative.    Physical Exam Updated Vital Signs There were no vitals taken for this visit.  Physical Exam Vitals signs and nursing note reviewed.  Constitutional:      General: She is not in acute distress.    Appearance: She is well-developed.  HENT:     Head: Normocephalic and atraumatic.  Eyes:     Conjunctiva/sclera: Conjunctivae normal.  Neck:     Musculoskeletal: Neck supple.  Cardiovascular:     Rate and Rhythm: Tachycardia present. Rhythm irregular.     Heart sounds: No murmur.  Pulmonary:     Effort: Pulmonary effort is normal. No respiratory distress.     Breath sounds: Normal breath sounds.  Abdominal:     Palpations: Abdomen is soft.     Tenderness: There is no abdominal tenderness.  Skin:    General: Skin is warm and dry.  Neurological:     Mental Status: She is alert.       ED Treatments / Results  Labs (all labs ordered are listed, but only abnormal results are displayed) Labs Reviewed  CBC WITH DIFFERENTIAL/PLATELET - Abnormal; Notable for the following components:      Result Value   WBC 12.4 (*)  RBC 6.18 (*)    Hemoglobin 16.6 (*)    HCT 52.6 (*)    Lymphs Abs 4.9 (*)    All other components within normal limits  BASIC METABOLIC PANEL - Abnormal; Notable for the following components:   Glucose, Bld 105 (*)    Anion gap 16 (*)    All other components within normal limits  MAGNESIUM - Abnormal; Notable for the following components:   Magnesium 1.6 (*)    All other components within normal limits  SARS CORONAVIRUS 2 (HOSPITAL ORDER, PERFORMED IN [facility])  TSH  TROPONIN I    EKG EKG Interpretation  Date/Time:  Monday Nov 16 2018 02:58:53 EDT Ventricular Rate:  165 PR Interval:    QRS Duration: 89 QT Interval:  347 QTC Calculation: 575 R Axis:   88 Text Interpretation:  A fib with RVR Nonspecific T abnormalities, diffuse leads No significant change since last tracing Confirmed by Rochele Raring [phone]) on 11/16/2018 3:06:41 AM   Radiology Dg Chest Portable 1 View  Result Date: 11/16/2018 CLINICAL DATA:  43 year old female with chest pain. EXAM: PORTABLE CHEST 1 VIEW COMPARISON:  Chest CT dated 12/05/2017 FINDINGS: The heart size and mediastinal contours are within normal limits. Both lungs are clear. The visualized skeletal structures are unremarkable. IMPRESSION: No active disease. Electronically Signed   By: Elgie Collard M.D.   On: 11/16/2018 02:22    Procedures Procedures (including critical care time)  CRITICAL CARE Performed by: Roxy Horseman  afib with RVR, tachy to 220s, IV diltiazem infusion, mag infusion Total critical care time: 43 minutes  Critical care time was exclusive of separately billable procedures and treating other patients.  Critical care was necessary to treat or prevent imminent or  life-threatening deterioration.  Critical care was time spent personally by me on the following activities: development of treatment plan with patient and/or surrogate as well as nursing, discussions with consultants, evaluation of patient's response to treatment, examination of patient, obtaining history from patient or surrogate, ordering and performing treatments and interventions, ordering and review of laboratory studies, ordering and review of radiographic studies, pulse oximetry and re-evaluation of patient's condition.  Medications Ordered in ED Medications - No data to display   Initial Impression / Assessment and Plan / ED Course  I have reviewed the triage vital signs and the nursing notes.  Pertinent labs & imaging results that were available during my care of the patient were reviewed by me and considered in my medical decision making (see chart for details).        Patient with heart palpitations.  Noted to be in A. fib with RVR.  Onset was approximately half hour prior to arrival, however patient reports having experienced palpitations intermittently for the past several months, and has been evaluated for the same in the past.  She is uncertain whether she has had these specific types of palpitations in the past month, therefore feel that risk for electric cardioversion is too great, and will attempt chemical cardioversion with diltiazem.  Patient will need admission to the hospital.  CHADS2VASC score: 2  Mag is a little low, 1.6, will replete.  CXR negative.  TSH normal.  Maybe a little dry, otherwise no notable lab abnormalities.  Patient seen by and discussed with Dr. Elesa Massed, who agrees with plan for admission.  4:04 AM Patient has converted back to NSR.    Discussed with Dr. Elesa Massed, who agrees that patient can go home and follow-up with cards.  I  discussed the case with Dr. Daphine Deutscher from cardiology, who recommends discharge and close f/u.  DC on Eliquis.  Discussed the  plan with the patient, who understands and agrees with the plan.  Final Clinical Impressions(s) / ED Diagnoses   Final diagnoses:  Atrial fibrillation with RVR [facility])    ED Discharge Orders         Ordered    apixaban (ELIQUIS) 5 MG TABS tablet  2 times daily     11/16/18 0517           Roxy Horseman, PA-C 11/16/18 0530    Ward, Layla Maw, DO 11/16/18 0533    Ward, Layla Maw, DO 11/16/18 [phone]

## 2018-11-16 NOTE — Progress Notes (Signed)
Electrophysiology TeleHealth Note   Due to national recommendations of social distancing due to COVID 19, Audio/video telehealth visit is felt to be most appropriate for this patient at this time.  See consent below from today for patient consent regarding telehealth for the Atrial Fibrillation Clinic. Consent obtained verbally.   Date:  11/16/2018   ID:  Emily Maldonado, DOB 30-May-1976, MRN [id number]  Location: home  Provider location: [address] [place], [place] [address] Evaluation Performed: New patient consult  PCP:  Ardith Dark, MD  Primary Cardiologist:  Dr Delton See   CC: Evaluation for atrial fibrillation/atrial flutter   History of Present Illness: Emily Maldonado is a 43 y.o. female who presents via audio/video conferencing for a telehealth visit today.   The patient is referred for new consultation regarding new onset afib/atrial flutter by [name]. Patient has a history of hypothyroidism, tobacco abuse, HTN, OSA on CPAP, obesity, and anxiety. She reports that she was lying in her bed early this AM and felt a heaviness in her chest.  She denies any pain, but has had some SOB.  She denies any hx of afib, but states she has had palpitations in the past and has been evaluated for these before. She has had palpitations in the past month but these typically resolve quickly. She admits that she has been dieting intensely in the keto diet for almost two weeks. She denies any significant alcohol use.    Today, she denies symptoms of chest pain, shortness of breath, orthopnea, PND, lower extremity edema, claudication, dizziness, presyncope, syncope, bleeding, or neurologic sequela. The patient is tolerating medications without difficulties and is otherwise without complaint today.   she denies symptoms of cough, fevers, chills, or new SOB worrisome for COVID 19.    Atrial Fibrillation Risk Factors:  she does have symptoms or diagnosis of sleep apnea. she is compliant with  CPAP therapy. she does not have a history of rheumatic fever. she does not have a history of alcohol use. The patient does have a history of early familial atrial fibrillation or other arrhythmias. Most of her immediate family members have afib.  she has a BMI of There is no height or weight on file to calculate BMI.. There were no vitals filed for this visit.  Past Medical History:  Diagnosis Date  . Abnormal Pap smear of cervix   . Anemia    b12  . Anxiety   . B12 deficiency anemia   . Childhood asthma   . Former tobacco use   . Hypertension   . Hypothyroidism   . Migraines    aura  . Morbid obesity (HCC)   . Ovarian cyst   . Sleep apnea    Use C-pap    Past Surgical History:  Procedure Laterality Date  . BREAST BIOPSY Left 2014  . BREAST EXCISIONAL BIOPSY Right   . CERVICAL BIOPSY  W/ LOOP ELECTRODE EXCISION    . LEFT HEART CATH AND CORONARY ANGIOGRAPHY N/A 12/04/2017   Procedure: LEFT HEART CATH AND CORONARY ANGIOGRAPHY;  Surgeon: Kathleene Hazel, MD;  Location: [facility] INVASIVE CV LAB;  Service: Cardiovascular;  Laterality: N/A;  . TONSILLECTOMY    . TUBAL LIGATION       Current Outpatient Medications  Medication Sig Dispense Refill  . adapalene (DIFFERIN) 0.1 % cream Apply topically at bedtime. (Patient not taking: Reported on 09/21/2018) 45 g 0  . apixaban (ELIQUIS) 5 MG TABS tablet Take 1 tablet (5 mg total)  by mouth 2 (two) times daily for 30 days. 60 tablet 6  . cyanocobalamin (,VITAMIN B-12,) 1000 MCG/ML injection Inject 1 mL (1,000 mcg total) into the muscle daily. 1000mcg IM Daily x 6 days, then 1000mcg IM weekly x 1 month, then 1000mcg monthly 25 mL 0  . guaiFENesin-codeine 100-10 MG/5ML syrup Take 5 mLs by mouth every 6 (six) hours as needed for cough. 120 mL 0  . levothyroxine (SYNTHROID, LEVOTHROID) 100 MCG tablet TAKE 1 TABLET BY MOUTH DAILY BEFORE BREAKFAST 30 tablet 5  . LYSINE PO Take by mouth.    . Magnesium 500 MG CAPS Take 500 mg by mouth daily.     . metoprolol succinate (TOPROL-XL) 50 MG 24 hr tablet Take 1 tablet (50 mg total) by mouth daily. 30 tablet 6  . omeprazole (PRILOSEC) 40 MG capsule TAKE ONE CAPSULE BY MOUTH DAILY 30 capsule 5  . potassium chloride SA (K-DUR,KLOR-CON) 20 MEQ tablet Take 2 tablets (40 mEq total) by mouth daily for 5 days. 10 tablet 0  . traMADol (ULTRAM) 50 MG tablet Take 1-2 tablets (50-100 mg total) by mouth every 6 (six) hours as needed. 20 tablet 0   No current facility-administered medications for this encounter.     Allergies:   Penicillins; Shellfish allergy; and Zithromax [azithromycin]   Social History:  The patient  reports that she has been smoking cigarettes. She has been smoking about 1.00 pack per day. She has never used smokeless tobacco. She reports previous alcohol use. She reports that she does not use drugs.   Family History:  The patient's  family history includes Atrial fibrillation in her maternal grandmother and mother; Breast cancer (age of onset: 5247) in her mother; Cervical cancer in her mother; Diabetes in her brother and mother; Heart failure in her father, maternal grandmother, and mother; Hypertension in her brother, father, and mother; Stroke in her maternal grandmother; Uterine cancer in her maternal aunt and maternal grandmother.    ROS:  Please see the history of present illness.   All other systems are personally reviewed and negative.   Exam: Well appearing, alert and conversant, regular work of breathing,  good skin color  Recent Labs: 09/18/2018: ALT 16 11/16/2018: BUN 12; Creatinine, Ser 0.81; Hemoglobin 16.6; Magnesium 1.6; Platelets 271; Potassium 3.5; Sodium 138; TSH 2.304  personally reviewed    Other studies personally reviewed: Additional studies/ records that were reviewed today include: Epic notes, echocardiogram, LHC  Echo 12/05/17 - Left ventricle: The cavity size was normal. Systolic function was   normal. The estimated ejection fraction was in the range  of 55%   to 60%. Wall motion was normal; there were no regional wall   motion abnormalities. Left ventricular diastolic function   parameters were normal. - Left atrium: The atrium was moderately dilated. - Atrial septum: No defect or patent foramen ovale was identified.  LA 50mm  LHC 12/04/17 1. No angiographic evidence of CAD 2. Mild elevation LV filling pressures 3. Non-cardiac chest pain    ASSESSMENT AND PLAN:  1. New onset paroxymal atrial fibrillation/atrial flutter ? Related to diet change.  General education about afib and anticoagulation provided and questions answered. Will increase Toprol to 50 mg daily. Start diltiazem 30 mg PRN q4hrs for heart racing. We discussed the risks and benefits of anticoagulation as well as her stroke risk. Will continue Eliquis 5 mg BID for 4 weeks after chemical cardioversion. Will revisit long-term anticoagulation at follow up visit. Lifestyle changes as below.  This patients  CHA2DS2-VASc Score and unadjusted Ischemic Stroke Rate (% per year) is equal to 2.2 % stroke rate/year from a score of 2  Above score calculated as 1 point each if present [CHF, HTN, DM, Vascular=MI/PAD/Aortic Plaque, Age if 65-74, or Female] Above score calculated as 2 points each if present [Age > 75, or Stroke/TIA/TE]  2. Obesity Likely contributing to her afib. Lifestyle modification was discussed and encouraged including regular physical activity and weight reduction. Encouraged moderation with keto diet.  3. HTN Stable, changes as above.  4. OSA Encouraged continued compliance with CPAP therapy.  5. Tobacco abuse Patient interested in smoking cessation and Chantix prescription. Encouraged her to follow up with PCP.   COVID screen The patient does not have any symptoms that suggest any further testing/ screening at this time.  Social distancing reinforced today.    Follow-up with AF clinic in one month.   Current medicines are reviewed at length  with the patient today.   The patient does not have concerns regarding her medicines.  The following changes were made today:  Start diltiazem, increase Toprol  Labs/ tests ordered today include:  No orders of the defined types were placed in this encounter.   Patient Risk:  after full review of this patients clinical status, I feel that they are at moderate risk at this time.   Today, I have spent 23 minutes with the patient with telehealth technology discussing atrial fibrillation, anticoagulation, medications, lifestyle changes, and COVID-19 precautions.    Dalia Heading PA-C 11/16/2018 3:40 PM  Afib Clinic [facility] [address] [place], [place] [address] [phone]   I hereby voluntarily request, consent and authorize the Atrial Fibrillation Clinic and its employed or contracted physicians, physician assistants, nurse practitioners or other licensed health care professionals (the Practitioner), to provide me with telemedicine health care services (the "Services") as deemed necessary by the treating Practitioner. I acknowledge and consent to receive the Services by the Practitioner via telemedicine. I understand that the telemedicine visit will involve communicating with the Practitioner through live audiovisual communication technology and the disclosure of certain medical information by electronic transmission. I acknowledge that I have been given the opportunity to request an in-person assessment or other available alternative prior to the telemedicine visit and am voluntarily participating in the telemedicine visit.   I understand that I have the right to withhold or withdraw my consent to the use of telemedicine in the course of my care at any time, without affecting my right to future care or treatment, and that the Practitioner or I may terminate the telemedicine visit at any time. I understand that I have the right to inspect all information obtained and/or  recorded in the course of the telemedicine visit and may receive copies of available information for a reasonable fee.  I understand that some of the potential risks of receiving the Services via telemedicine include:   Delay or interruption in medical evaluation due to technological equipment failure or disruption;  Information transmitted may not be sufficient (e.g. poor resolution of images) to allow for appropriate medical decision making by the Practitioner; and/or  In rare instances, security protocols could fail, causing a breach of personal health information.   Furthermore, I acknowledge that it is my responsibility to provide information about my medical history, conditions and care that is complete and accurate to the best of my ability. I acknowledge that Practitioner's advice, recommendations, and/or decision may be based on factors not within their control, such  as incomplete or inaccurate data provided by me or distortions of diagnostic images or specimens that may result from electronic transmissions. I understand that the practice of medicine is not an exact science and that Practitioner makes no warranties or guarantees regarding treatment outcomes. I acknowledge that I will receive a copy of this consent concurrently upon execution via email to the email address I last provided but may also request a printed copy by calling the office of the Atrial Fibrillation Clinic.  I understand that my insurance will be billed for this visit.   I have read or had this consent read to me.  I understand the contents of this consent, which adequately explains the benefits and risks of the Services being provided via telemedicine.  I have been provided ample opportunity to ask questions regarding this consent and the Services and have had my questions answered to my satisfaction.  I give my informed consent for the services to be provided through the use of telemedicine in my medical care  By  participating in this telemedicine visit I agree to the above.

## 2018-11-16 NOTE — ED Notes (Signed)
Patient verbalizes understanding of discharge instructions. Opportunity for questioning and answers were provided. Armband removed by staff, pt discharged from ED by wheelchair   

## 2018-11-16 NOTE — ED Triage Notes (Signed)
The pt  Had an episode of chest pressure with sob rapid heart rate that started approx 30 minutes ago at home  lmp last month  Alert oriented skin warm and dry no distress  Nasal 02 at 2 liters  Given to the pt for a rapid heart rate

## 2018-11-16 NOTE — Telephone Encounter (Signed)
Eliquis has been approved through 11/26/2019.  Patient's pharmacy notified.

## 2018-11-17 ENCOUNTER — Encounter (HOSPITAL_COMMUNITY): Payer: Self-pay

## 2018-11-17 ENCOUNTER — Encounter: Payer: Self-pay | Admitting: Family Medicine

## 2018-11-17 ENCOUNTER — Telehealth (INDEPENDENT_AMBULATORY_CARE_PROVIDER_SITE_OTHER): Payer: BLUE CROSS/BLUE SHIELD | Admitting: Family Medicine

## 2018-11-17 DIAGNOSIS — I10 Essential (primary) hypertension: Secondary | ICD-10-CM

## 2018-11-17 DIAGNOSIS — L709 Acne, unspecified: Secondary | ICD-10-CM

## 2018-11-17 DIAGNOSIS — I480 Paroxysmal atrial fibrillation: Secondary | ICD-10-CM | POA: Diagnosis not present

## 2018-11-17 DIAGNOSIS — F17200 Nicotine dependence, unspecified, uncomplicated: Secondary | ICD-10-CM | POA: Diagnosis not present

## 2018-11-17 DIAGNOSIS — E039 Hypothyroidism, unspecified: Secondary | ICD-10-CM | POA: Diagnosis not present

## 2018-11-17 DIAGNOSIS — E538 Deficiency of other specified B group vitamins: Secondary | ICD-10-CM

## 2018-11-17 MED ORDER — VARENICLINE TARTRATE 1 MG PO TABS: 1.0000 mg | ORAL_TABLET | Freq: Two times a day (BID) | ORAL | 0 refills | Status: DC

## 2018-11-17 MED ORDER — CYANOCOBALAMIN 1000 MCG/ML IJ SOLN: 1000.0000 ug | INTRAMUSCULAR | 0 refills | Status: DC

## 2018-11-17 MED ORDER — VARENICLINE TARTRATE 0.5 MG X 11 & 1 MG X 42 PO MISC: ORAL | 0 refills | Status: DC

## 2018-11-17 MED ORDER — CLINDAMYCIN PHOSPHATE 1 % EX GEL: Freq: Two times a day (BID) | CUTANEOUS | 0 refills | Status: DC

## 2018-11-17 NOTE — Assessment & Plan Note (Signed)
Start Clindagel.  If no improvement, would consider course of low-dose doxycycline.

## 2018-11-17 NOTE — Assessment & Plan Note (Signed)
Patient was asked about her tobacco use today and was strongly advised to quit. Patient is currently contemplative. We reviewed treatment options to assist her quit smoking including NRT, Chantix, and Bupropion.  She wishes to restart Chantix.  Prescription for this was sent into her pharmacy.  Follow up at next office visit.   Total time spent counseling approximately 3 minutes.

## 2018-11-17 NOTE — Progress Notes (Signed)
Chief Complaint:  Emily Maldonado is a 43 y.o. female who presents today for a virtual office visit with a chief complaint of ED follow up for atrial fibrillation.   Assessment/Plan:  Paroxysmal atrial fibrillation (HCC) Doing well.  Continue management per cardiology.  Morbid obesity (HCC) She has lost quite a bit of weight with her keto diet.  She will be putting that on hold for now as she thinks it could have contributed to her atrial fibrillation.  She will continue via other lifestyle modifications.  We will follow-up at her next office visit.  Low vitamin B12 level We will refill B12.  Will need B12 level checked soon with next blood draw.  Hypothyroidism Stable on Synthroid 112 mcg daily.  Check TSH with next blood draw.  Hypertension Stable.  Continue metoprolol succinate 50 mg daily.  Acne Start Clindagel.  If no improvement, would consider course of low-dose doxycycline.  Nicotine dependence with current use Patient was asked about her tobacco use today and was strongly advised to quit. Patient is currently contemplative. We reviewed treatment options to assist her quit smoking including NRT, Chantix, and Bupropion.  She wishes to restart Chantix.  Prescription for this was sent into her pharmacy.  Follow up at next office visit.   Total time spent counseling approximately 3 minutes.       Subjective:  HPI:  Atrial Fibrillation Patient presented to the ED on 11/16/2018 with palpitations and shortness of breath.  She was found to be in atrial fibrillation with RVR.  She was started on diltiazem and spontaneously converted back to sinus rhythm and was discharged home. She was started on eliquis due to elevated chadsvasc score. Yesterday she followed up with cardiology who increased her toprol 50mg  daily.  She was told that she should be anticoagulated for at least a month before further evaluation.  Overall she feels like she is doing well.  She had a couple episodes  yesterday where she felt like she converted back to atrial fibrillation but is otherwise doing well.  Tolerating her medications well without side effects.  # Hypothyroidism Recently increased synthroid 112mcg.  Doing well on this dose.  # Morbid Obesity Had been doing keto diet and lost quite a bit of weight with this.  Thinks that this may have contributed to her going into atrial fibrillation.  # Hypertension As noted above, her metoprolol succinate was increased to 50 mg daily.  She has been monitoring her blood pressure the last few days and it is typically been in the low 100s.  # Vitamin B12 Currently on monthly injections.  She is interested in going back to every 2-week injections.  She is tolerating injections well without side effects.  # Nicotine Dependence Currently smoking about a pack per day.  She would like to start Chantix.  She has done this in the past and was able to quit for about 4 years with it.  She tolerated well without reported side effects.  # Acne Was prescribed Differin at the previous office visit.  Unfortunately this caused quite a bit of skin irritation and she has since stopped it.  She is having a current outbreak on her face.  ROS: Per HPI  PMH: She reports that she has been smoking cigarettes. She has been smoking about 1.00 pack per day. She has never used smokeless tobacco. She reports previous alcohol use. She reports that she does not use drugs.      Objective/Observations  Physical  Exam: Gen: NAD, resting comfortably Pulm: Normal work of breathing Neuro: Grossly normal, moves all extremities Psych: Normal affect and thought content  Virtual Visit via Video   I connected with Emily Maldonado on 11/17/18 at  8:00 AM EDT by a video enabled telemedicine application and verified that I am speaking with the correct person using two identifiers. I discussed the limitations of evaluation and management by telemedicine and the availability of in  person appointments. The patient expressed understanding and agreed to proceed.   Patient location: Home Provider location: [place] [employer] Persons participating in the virtual visit: Myself and Patient     Katina Degree. [name], MD 11/17/2018 11:45 AM

## 2018-11-17 NOTE — Assessment & Plan Note (Signed)
Stable.  Continue metoprolol succinate 50 mg daily.

## 2018-11-17 NOTE — Assessment & Plan Note (Signed)
We will refill B12.  Will need B12 level checked soon with next blood draw.

## 2018-11-17 NOTE — Assessment & Plan Note (Signed)
She has lost quite a bit of weight with her keto diet.  She will be putting that on hold for now as she thinks it could have contributed to her atrial fibrillation.  She will continue via other lifestyle modifications.  We will follow-up at her next office visit.

## 2018-11-17 NOTE — Assessment & Plan Note (Signed)
Stable on Synthroid 112 mcg daily.  Check TSH with next blood draw.

## 2018-11-17 NOTE — Assessment & Plan Note (Signed)
Doing well.  Continue management per cardiology.

## 2018-12-16 ENCOUNTER — Encounter (HOSPITAL_COMMUNITY): Payer: Self-pay | Admitting: *Deleted

## 2019-01-11 ENCOUNTER — Telehealth: Payer: Self-pay | Admitting: Family Medicine

## 2019-01-11 NOTE — Telephone Encounter (Signed)
In office visit is ok as long as patient has no COVID symptoms or exposure.  No triage necessary.

## 2019-01-11 NOTE — Telephone Encounter (Signed)
Please advise on what is most appropriate, a virtual visit or in office and if it needs to be triaged?  Copied from [place] [id number]. Topic: Appointment Scheduling - Scheduling Inquiry for Clinic >> Jan 11, 2019 11:26 AM Alanda Slim E wrote: Reason for CRM: Pt wants to schedule an in office visit. Pt has had vaginal bleeding for over a month/ please advise

## 2019-01-11 NOTE — Telephone Encounter (Signed)
Patient is scheduled   

## 2019-01-13 ENCOUNTER — Other Ambulatory Visit: Payer: Self-pay | Admitting: Family Medicine

## 2019-01-15 ENCOUNTER — Other Ambulatory Visit: Payer: Self-pay

## 2019-01-15 ENCOUNTER — Encounter: Payer: Self-pay | Admitting: Family Medicine

## 2019-01-15 ENCOUNTER — Ambulatory Visit: Payer: BLUE CROSS/BLUE SHIELD | Admitting: Family Medicine

## 2019-01-15 VITALS — BP 142/90 | HR 95 | Temp 98.4°F | Ht 65.0 in | Wt 267.4 lb

## 2019-01-15 DIAGNOSIS — F17200 Nicotine dependence, unspecified, uncomplicated: Secondary | ICD-10-CM

## 2019-01-15 DIAGNOSIS — I10 Essential (primary) hypertension: Secondary | ICD-10-CM

## 2019-01-15 DIAGNOSIS — E559 Vitamin D deficiency, unspecified: Secondary | ICD-10-CM | POA: Diagnosis not present

## 2019-01-15 DIAGNOSIS — E538 Deficiency of other specified B group vitamins: Secondary | ICD-10-CM

## 2019-01-15 DIAGNOSIS — L729 Follicular cyst of the skin and subcutaneous tissue, unspecified: Secondary | ICD-10-CM | POA: Diagnosis not present

## 2019-01-15 DIAGNOSIS — E039 Hypothyroidism, unspecified: Secondary | ICD-10-CM

## 2019-01-15 DIAGNOSIS — N938 Other specified abnormal uterine and vaginal bleeding: Secondary | ICD-10-CM | POA: Diagnosis not present

## 2019-01-15 MED ORDER — VARENICLINE TARTRATE 1 MG PO TABS: 1.0000 mg | ORAL_TABLET | Freq: Two times a day (BID) | ORAL | 3 refills | Status: DC

## 2019-01-15 MED ORDER — SULFAMETHOXAZOLE-TRIMETHOPRIM 800-160 MG PO TABS: 1.0000 | ORAL_TABLET | Freq: Two times a day (BID) | ORAL | 0 refills | Status: AC

## 2019-01-15 MED ORDER — MEDROXYPROGESTERONE ACETATE 10 MG PO TABS: 10.0000 mg | ORAL_TABLET | Freq: Every day | ORAL | 0 refills | Status: DC

## 2019-01-15 NOTE — Assessment & Plan Note (Signed)
Discussed lifestyle modificaions.

## 2019-01-15 NOTE — Assessment & Plan Note (Signed)
Slightly above goal.  Previously well controlled.  Continue with metoprolol succinate 50 mg daily and blood pressure monitoring goal 140/90 or lower.

## 2019-01-15 NOTE — Assessment & Plan Note (Signed)
Check B12.  Continue supplementation. °

## 2019-01-15 NOTE — Assessment & Plan Note (Signed)
Continue levothyroxine 112 mcg daily.  Check TSH with blood work.

## 2019-01-15 NOTE — Patient Instructions (Signed)
It was very nice to see you today!  Please start the provera.   Start the bactrim if your cyst worsens.  We will check blood work.  Let me know if your symptoms worsen or do not improve in a few days.   Please try these tips to maintain a healthy lifestyle:   Eat at least 3 REAL meals and 1-2 snacks per day.  Aim for no more than 5 hours between eating.  If you eat breakfast, please do so within one hour of getting up.    Obtain twice as many fruits/vegetables as protein or carbohydrate foods for both lunch and dinner. (Half of each meal should be fruits/vegetables, one quarter protein, and one quarter starchy cars)   Cut down on sweet beverages. This includes juice, soda, and sweet tea.    Exercise at least 150 minutes every week.   Take care, Dr Jerline Pain

## 2019-01-15 NOTE — Assessment & Plan Note (Signed)
Doing better with chantix. Will refill.

## 2019-01-15 NOTE — Progress Notes (Signed)
   Chief Complaint:  Emily Maldonado is a 43 y.o. female who presents for same day appointment with a chief complaint of vaginal bleeding.   Assessment/Plan:  Dysfunctional uterine bleeding No red flags.  Likely secondary to her known adenomyosis.  Will start Provera to stop the bleeding.  We will also check CBC, C met, and TSH.  Recommended she follow-up with GYN soon to discuss management options.  Discussed reasons to return to care or seek emergent care.  Skin Cyst No frank signs of cellulitis today however we will send in a "pocket prescription" for Bactrim in case symptoms worsen over the next few days.  Discussed reasons to return to care.  Body mass index is 44.5 kg/m. / Morbid Obesity BMI Metric Follow Up - 01/15/19 1504      BMI Metric Follow Up-Please document annually   BMI Metric Follow Up  Education provided      Nicotine dependence with current use Doing better with chantix. Will refill.  Low vitamin B12 level Check B12. Continue supplementation.   Morbid obesity ([place]) Discussed lifestyle modificaions.   Hypertension Slightly above goal.  Previously well controlled.  Continue with metoprolol succinate 50 mg daily and blood pressure monitoring goal 140/90 or lower.  Hypothyroidism Continue levothyroxine 112 mcg daily.  Check TSH with blood work.     Subjective:  HPI:  Vaginal Bleeding, acute problem Started 5-6 weeks ago. Associated with cramping, nausea, and back pain.  She has been diagnosed with adenomyosis in the past and thinks that this is the main culprit.  She was recently started on Eliquis for paroxysmal atrial fibrillation however she stopped this a few days ago but has had continued bleeding.  No chance of being pregnant as she is status post tubal ligation.  She has tried taking vitamins with no improvement.  No other treatments tried.  No other obvious alleviating or aggravating factors.  She has had some dizziness for last couple of days.  Cyst  She also had a small spot appeared in front of her left ear a few days ago.  She tried popping this but nothing came out.  She has noticed increased redness and swelling to the area over the last couple of days.  ROS: Per HPI  PMH: She reports that she has been smoking cigarettes. She has been smoking about 1.00 pack per day. She has never used smokeless tobacco. She reports previous alcohol use. She reports that she does not use drugs.      Objective:  Physical Exam: Ht '5\' 5"'$  (1.651 m)   Wt 267 lb 6.4 oz (121.3 kg)   BMI 44.50 kg/m   Gen: NAD, resting comfortably CV: Regular rate and rhythm with no murmurs appreciated Pulm: Normal work of breathing, clear to auscultation bilaterally with no crackles, wheezes, or rhonchi MSK: No edema, cyanosis, or clubbing noted Skin: Warm, dry Neuro: Grossly normal, moves all extremities Psych: Normal affect and thought content      Adreana Coull M. Jerline Pain, MD 01/15/2019 2:33 PM

## 2019-01-16 LAB — VITAMIN D 25 HYDROXY (VIT D DEFICIENCY, FRACTURES): Vit D, 25-Hydroxy: 24 ng/mL — ABNORMAL LOW (ref 30–100)

## 2019-01-16 LAB — COMPREHENSIVE METABOLIC PANEL
AG Ratio: 1.3 (calc) (ref 1.0–2.5)
ALT: 11 U/L (ref 6–29)
AST: 15 U/L (ref 10–35)
Albumin: 3.9 g/dL (ref 3.6–5.1)
Alkaline phosphatase (APISO): 85 U/L (ref 31–125)
BUN: 7 mg/dL (ref 7–25)
CO2: 24 mmol/L (ref 20–32)
Calcium: 9.1 mg/dL (ref 8.6–10.2)
Chloride: 104 mmol/L (ref 98–110)
Creat: 0.64 mg/dL (ref 0.50–1.10)
Globulin: 2.9 g/dL (calc) (ref 1.9–3.7)
Glucose, Bld: 90 mg/dL (ref 65–99)
Potassium: 3.8 mmol/L (ref 3.5–5.3)
Sodium: 138 mmol/L (ref 135–146)
Total Bilirubin: 0.4 mg/dL (ref 0.2–1.2)
Total Protein: 6.8 g/dL (ref 6.1–8.1)

## 2019-01-16 LAB — TSH: TSH: 1.98 mIU/L

## 2019-01-16 LAB — IRON,TIBC AND FERRITIN PANEL
%SAT: 12 % (calc) — ABNORMAL LOW (ref 16–45)
Ferritin: 20 ng/mL (ref 16–232)
Iron: 38 ug/dL — ABNORMAL LOW (ref 40–190)
TIBC: 330 mcg/dL (calc) (ref 250–450)

## 2019-01-16 LAB — CBC
HCT: 42.5 % (ref 35.0–45.0)
Hemoglobin: 14.5 g/dL (ref 11.7–15.5)
MCH: 27.4 pg (ref 27.0–33.0)
MCHC: 34.1 g/dL (ref 32.0–36.0)
MCV: 80.3 fL (ref 80.0–100.0)
MPV: 11.4 fL (ref 7.5–12.5)
Platelets: 280 10*3/uL (ref 140–400)
RBC: 5.29 10*6/uL — ABNORMAL HIGH (ref 3.80–5.10)
RDW: 14.1 % (ref 11.0–15.0)
WBC: 10 10*3/uL (ref 3.8–10.8)

## 2019-01-16 LAB — VITAMIN B12: Vitamin B-12: 679 pg/mL (ref 200–1100)

## 2019-01-18 ENCOUNTER — Encounter: Payer: Self-pay | Admitting: Family Medicine

## 2019-01-18 DIAGNOSIS — R7989 Other specified abnormal findings of blood chemistry: Secondary | ICD-10-CM | POA: Insufficient documentation

## 2019-01-18 NOTE — Progress Notes (Signed)
Please inform patient of the following:  Her iron level and blood counts dropped slightly but are still within normal ranges.  Thyroid level is normal. B12 level is normal. Her vitamin D is slightly low. I dont think this anything to do with her bleeding, but would like for her to take 1000IU vitamin D per day and we can recheck in a few months.  Everything else is NORMAL.  Emily Maldonado. Jerline Pain, MD 01/18/2019 9:05 AM

## 2019-01-27 ENCOUNTER — Encounter: Payer: Self-pay | Admitting: Family Medicine

## 2019-02-02 ENCOUNTER — Other Ambulatory Visit: Payer: Self-pay

## 2019-02-02 ENCOUNTER — Ambulatory Visit (INDEPENDENT_AMBULATORY_CARE_PROVIDER_SITE_OTHER): Payer: BC Managed Care – PPO

## 2019-02-02 DIAGNOSIS — N938 Other specified abnormal uterine and vaginal bleeding: Secondary | ICD-10-CM

## 2019-02-02 MED ORDER — MEDROXYPROGESTERONE ACETATE 150 MG/ML IM SUSP
150.0000 mg | Freq: Once | INTRAMUSCULAR | Status: AC
  Administered 2019-02-02: 10:00:00 150 mg via INTRAMUSCULAR

## 2019-02-02 NOTE — Progress Notes (Signed)
Per orders of Dr.Andy, injection of Depo Provera given by Loralyn Freshwater.Given in left deltoid.Patient tolerated injection well.

## 2019-03-08 ENCOUNTER — Other Ambulatory Visit: Payer: Self-pay | Admitting: Family Medicine

## 2019-03-22 ENCOUNTER — Encounter: Payer: Self-pay | Admitting: Family Medicine

## 2019-03-23 ENCOUNTER — Other Ambulatory Visit: Payer: Self-pay

## 2019-03-23 DIAGNOSIS — E039 Hypothyroidism, unspecified: Secondary | ICD-10-CM

## 2019-03-26 ENCOUNTER — Encounter: Payer: Self-pay | Admitting: Obstetrics & Gynecology

## 2019-03-26 ENCOUNTER — Other Ambulatory Visit: Payer: Self-pay

## 2019-03-26 ENCOUNTER — Ambulatory Visit: Payer: BC Managed Care – PPO | Admitting: Obstetrics & Gynecology

## 2019-03-26 ENCOUNTER — Other Ambulatory Visit (INDEPENDENT_AMBULATORY_CARE_PROVIDER_SITE_OTHER): Payer: BC Managed Care – PPO

## 2019-03-26 VITALS — BP 134/86 | Ht 65.0 in | Wt 262.0 lb

## 2019-03-26 DIAGNOSIS — R87610 Atypical squamous cells of undetermined significance on cytologic smear of cervix (ASC-US): Secondary | ICD-10-CM | POA: Diagnosis not present

## 2019-03-26 DIAGNOSIS — R87810 Cervical high risk human papillomavirus (HPV) DNA test positive: Secondary | ICD-10-CM

## 2019-03-26 DIAGNOSIS — N938 Other specified abnormal uterine and vaginal bleeding: Secondary | ICD-10-CM

## 2019-03-26 DIAGNOSIS — Z01419 Encounter for gynecological examination (general) (routine) without abnormal findings: Secondary | ICD-10-CM

## 2019-03-26 DIAGNOSIS — N921 Excessive and frequent menstruation with irregular cycle: Secondary | ICD-10-CM

## 2019-03-26 DIAGNOSIS — Z6841 Body Mass Index (BMI) 40.0 and over, adult: Secondary | ICD-10-CM

## 2019-03-26 NOTE — Addendum Note (Signed)
Addended by: Francis Dowse T on: 03/26/2019 04:38 PM   Modules accepted: Orders

## 2019-03-26 NOTE — Progress Notes (Signed)
Emily Maldonado Apr 18, 1976 [id number]   History:    43 y.o. G5P3A2L2  S/P TL.  RP:  New patient presenting for annual gyn exam   HPI: Menometrorrhagia treated with Progestins PO in 11/2018.  DepoProvera injection received 02/02/2019.  No vaginal bleeding x 4 weeks now.  No abdominopelvic pain.  Sexually active.  Pelvic US 03/2018 Normal size Uterus/Ovaries normal.  Patient reports being told that she may have Adenomyosis.  H/O LEEP.  Last Pap 02/2018 ASCUS/HPV HR positive.  Colposcopy 03/2018 No dysplasia.  Cigarette smoker.  Past medical history,surgical history, family history and social history were all reviewed and documented in the EPIC chart.  Gynecologic History No LMP recorded. Patient has had an injection. Contraception: tubal ligation Last Pap: 02/2018. Results were: ASCUS/HPV HR pos.  Colpo 03/2018 No dysplasia. Last mammogram: 09/2017. Results were: Negative Bone Density: Never Colonoscopy: Never  Obstetric History OB History  Gravida Para Term Preterm AB Living  5       2 3   SAB TAB Ectopic Multiple Live Births  1 1     3     # Outcome Date GA Lbr Len/2nd Weight Sex Delivery Anes PTL Lv  5 Gravida           4 Gravida           3 Gravida           2 TAB           1 SAB              ROS: A ROS was performed and pertinent positives and negatives are included in the history.  GENERAL: No fevers or chills. HEENT: No change in vision, no earache, sore throat or sinus congestion. NECK: No pain or stiffness. CARDIOVASCULAR: No chest pain or pressure. No palpitations. PULMONARY: No shortness of breath, cough or wheeze. GASTROINTESTINAL: No abdominal pain, nausea, vomiting or diarrhea, melena or bright red blood per rectum. GENITOURINARY: No urinary frequency, urgency, hesitancy or dysuria. MUSCULOSKELETAL: No joint or muscle pain, no back pain, no recent trauma. DERMATOLOGIC: No rash, no itching, no lesions. ENDOCRINE: No polyuria, polydipsia, no heat or cold intolerance. No recent  change in weight. HEMATOLOGICAL: No anemia or easy bruising or bleeding. NEUROLOGIC: No headache, seizures, numbness, tingling or weakness. PSYCHIATRIC: No depression, no loss of interest in normal activity or change in sleep pattern.     Exam:   BP 134/86   Ht 5\' 5"  (1.651 m)   Wt 262 lb (118.8 kg)   BMI 43.60 kg/m   Body mass index is 43.6 kg/m.  General appearance : Well developed well nourished female. No acute distress HEENT: Eyes: no retinal hemorrhage or exudates,  Neck supple, trachea midline, no carotid bruits, no thyroidmegaly Lungs: Clear to auscultation, no rhonchi or wheezes, or rib retractions  Heart: Regular rate and rhythm, no murmurs or gallops Breast:Examined in sitting and supine position were symmetrical in appearance, no palpable masses or tenderness,  no skin retraction, no nipple inversion, no nipple discharge, no skin discoloration, no axillary or supraclavicular lymphadenopathy Abdomen: no palpable masses or tenderness, no rebound or guarding Extremities: no edema or skin discoloration or tenderness  Pelvic: Vulva: Normal             Vagina: No gross lesions or discharge  Cervix: No gross lesions or discharge.  Pap/HPV HR done.  Uterus  AV, normal size, shape and consistency, non-tender and mobile  Adnexa  Without masses or tenderness  Anus:  Normal  Pelvic US 03/2018: Uterus 9.0 x 6.7 x 5.5 cm.  Endometrial line 7.52 mm.  Ovaries normal with follicles.   Assessment/Plan:  43 y.o. female for annual exam   1. Menometrorrhagia Menometrorrhagia possibly associated with irregular ovulation and adenomyosis.  Hemoglobin in July 2020 was normal at 14.5.  TSH normal September 2020.  Menometrorrhagia currently controlled with 1 injection of Depo-Provera received on 02/02/2019.  No abdominopelvic pain or bleeding for the past 4 weeks.  Last pelvic ultrasound September 2019 was normal.  Will repeat a pelvic ultrasound to complete the investigation.  We will complete the  investigation and observe on Depo-Provera.  We will proceed with a hysterectomy if menometrorrhagia or pelvic pain are not controlled on Depo-Provera or if patient develops significant side effects on the treatment.  Patient agrees with plan. - US Transvaginal Non-OB; Future  2. ASCUS with positive high risk HPV cervical Pap test with ASCUS and positive high-risk HPV August 2019.  Colposcopy September 2019 showed no dysplasia.  Pap/HPV HR done today.  Strongly recommend to quit smoking cigarette.  3. Class 3 severe obesity due to excess calories with serious comorbidity and body mass index (BMI) of 40.0 to 44.9 in adult [facility]) Recommend a lower calorie/carb diet such as [employer].  Aerobic physical activities 5 times a week and weightlifting every 2 days.  Other orders - aspirin 325 MG tablet; Take 325 mg by mouth daily.  F/U Annual/Gyn visit and Pelvic US.  Counseling on above issues and coordination of care more than 50% for 45 minutes.  Princess Bruins MD, 3:02 PM 03/26/2019

## 2019-03-27 ENCOUNTER — Encounter: Payer: Self-pay | Admitting: Obstetrics & Gynecology

## 2019-03-27 LAB — IRON,TIBC AND FERRITIN PANEL
%SAT: 10 % (calc) — ABNORMAL LOW (ref 16–45)
Ferritin: 20 ng/mL (ref 16–232)
Iron: 34 ug/dL — ABNORMAL LOW (ref 40–190)
TIBC: 341 mcg/dL (calc) (ref 250–450)

## 2019-03-27 LAB — TSH: TSH: 1.31 mIU/L

## 2019-03-27 NOTE — Patient Instructions (Signed)
1. Menometrorrhagia Menometrorrhagia possibly associated with irregular ovulation and adenomyosis.  Hemoglobin in July 2020 was normal at 14.5.  TSH normal September 2020.  Menometrorrhagia currently controlled with 1 injection of Depo-Provera received on 02/02/2019.  No abdominopelvic pain or bleeding for the past 4 weeks.  Last pelvic ultrasound September 2019 was normal.  Will repeat a pelvic ultrasound to complete the investigation.  We will complete the investigation and observe on Depo-Provera.  We will proceed with a hysterectomy if menometrorrhagia or pelvic pain are not controlled on Depo-Provera or if patient develops significant side effects on the treatment.  Patient agrees with plan. - US Transvaginal Non-OB; Future  2. ASCUS with positive high risk HPV cervical Pap test with ASCUS and positive high-risk HPV August 2019.  Colposcopy September 2019 showed no dysplasia.  Pap/HPV HR done today.  Strongly recommend to quit smoking cigarette.  3. Class 3 severe obesity due to excess calories with serious comorbidity and body mass index (BMI) of 40.0 to 44.9 in adult [facility]) Recommend a lower calorie/carb diet such as [employer].  Aerobic physical activities 5 times a week and weightlifting every 2 days.  Other orders - aspirin 325 MG tablet; Take 325 mg by mouth daily.  F/U Annual/Gyn visit and Pelvic US.  Enrique, it was a pleasure meeting you today!  I will inform you of your results as soon as they are available.

## 2019-03-29 NOTE — Progress Notes (Signed)
Please inform patient of the following:  Thyroid levels are normal but her iron levels have dropped slightly. Please make sure patient is taking an iron supplement - recommend ferrous sulfate 325mg  every other day.  Algis Greenhouse. Jerline Pain, MD 03/29/2019 8:05 AM

## 2019-04-01 LAB — PAP, TP IMAGING W/ HPV RNA, RFLX HPV TYPE 16,18/45: HPV DNA High Risk: DETECTED — AB

## 2019-04-01 LAB — HPV TYPE 16 AND 18/45 RNA
HPV Type 16 RNA: NOT DETECTED
HPV Type 18/45 RNA: NOT DETECTED

## 2019-05-06 ENCOUNTER — Other Ambulatory Visit: Payer: BC Managed Care – PPO

## 2019-05-06 ENCOUNTER — Ambulatory Visit (INDEPENDENT_AMBULATORY_CARE_PROVIDER_SITE_OTHER): Payer: BC Managed Care – PPO | Admitting: Anesthesiology

## 2019-05-06 ENCOUNTER — Other Ambulatory Visit: Payer: Self-pay

## 2019-05-06 ENCOUNTER — Ambulatory Visit: Payer: BC Managed Care – PPO | Admitting: Obstetrics & Gynecology

## 2019-05-06 DIAGNOSIS — N921 Excessive and frequent menstruation with irregular cycle: Secondary | ICD-10-CM

## 2019-05-06 MED ORDER — MEDROXYPROGESTERONE ACETATE 150 MG/ML IM SUSP
150.0000 mg | Freq: Once | INTRAMUSCULAR | Status: AC
  Administered 2019-05-06: 150 mg via INTRAMUSCULAR

## 2019-05-07 ENCOUNTER — Other Ambulatory Visit: Payer: Self-pay | Admitting: Family Medicine

## 2019-05-12 ENCOUNTER — Other Ambulatory Visit: Payer: Self-pay

## 2019-05-12 MED ORDER — "SYRINGE/NEEDLE (DISP) 22G X 1-1/2"" 3 ML MISC": 0 refills | Status: DC

## 2019-06-04 ENCOUNTER — Other Ambulatory Visit (HOSPITAL_COMMUNITY): Payer: Self-pay | Admitting: Physician Assistant

## 2019-07-13 IMAGING — DX PORTABLE CHEST - 1 VIEW
1 series · 1 of 1 positions shown · non-contrast
Comparison: Chest CT dated 12/05/2017

CLINICAL DATA: 42-year-old female with chest pain.

EXAM:
PORTABLE CHEST 1 VIEW

[chest ap]
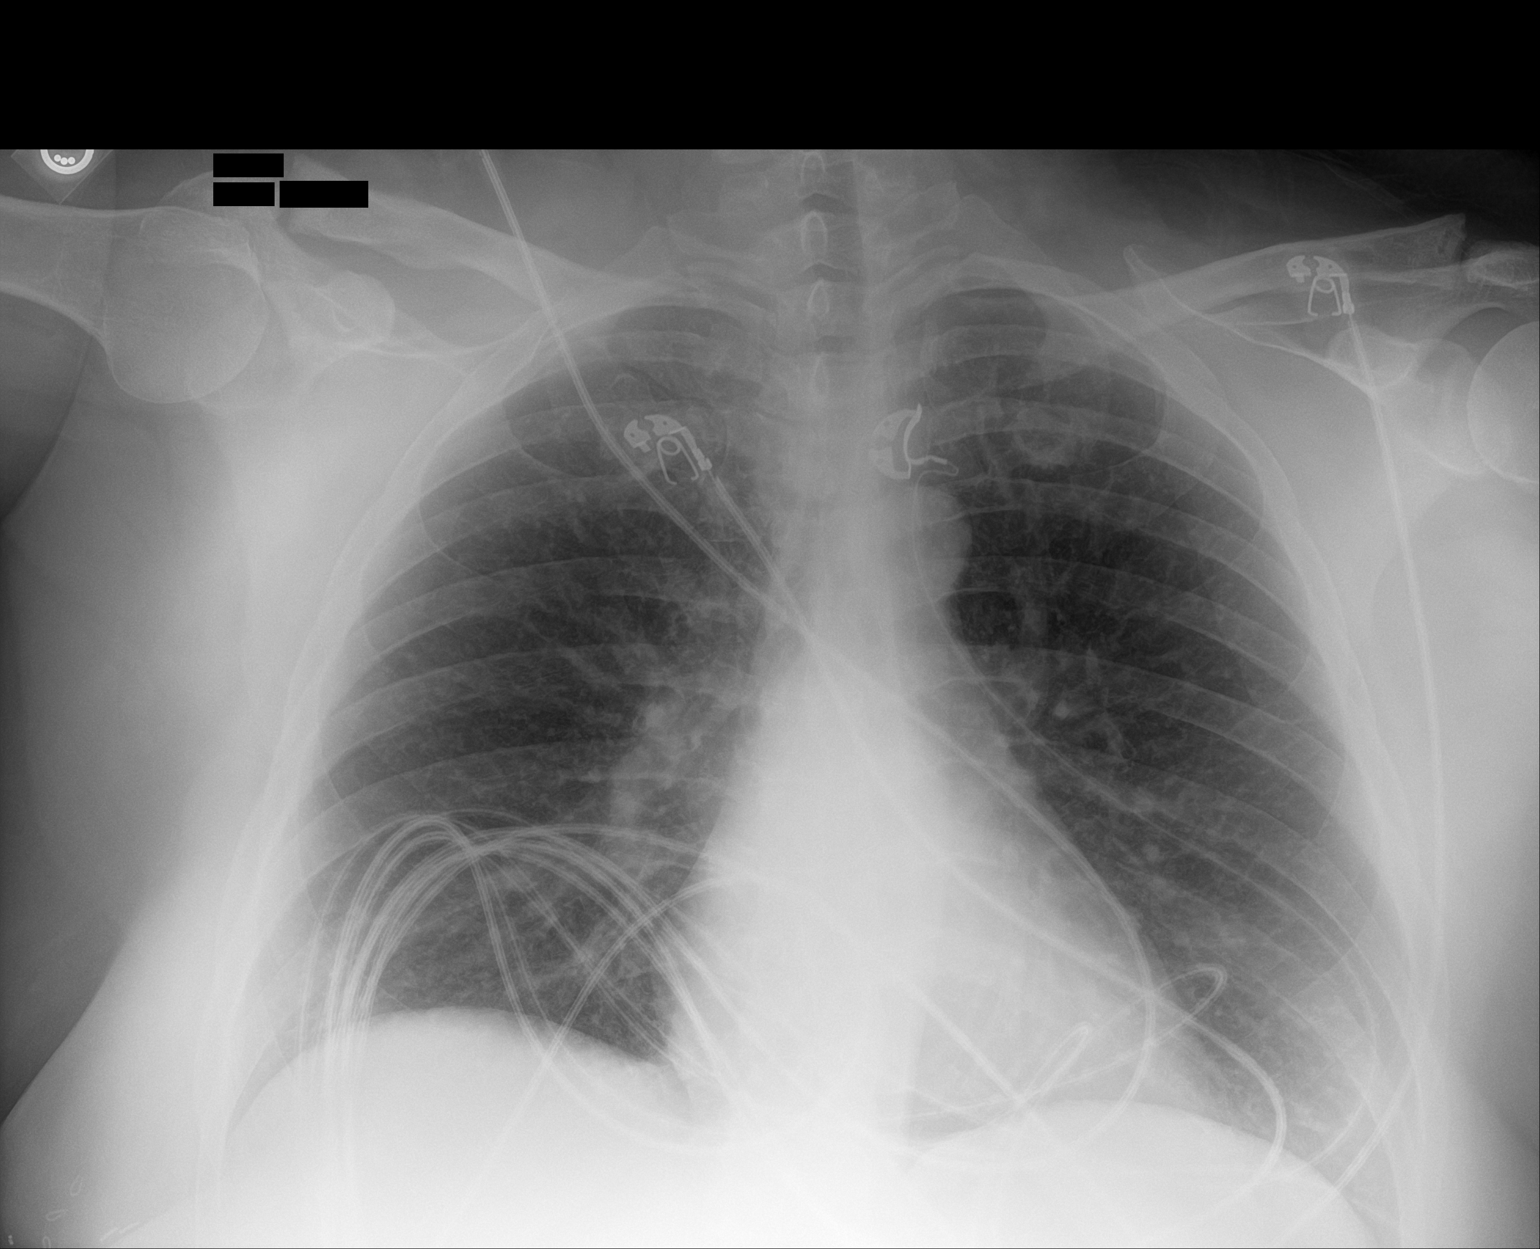

[1 of 1 positions shown; findings below may reference images not displayed]

FINDINGS: The heart size and mediastinal contours are within normal limits.
Both lungs are clear. The visualized skeletal structures are
unremarkable.
IMPRESSION: No active disease.

## 2019-07-28 ENCOUNTER — Encounter: Payer: Self-pay | Admitting: Family Medicine

## 2019-07-30 ENCOUNTER — Other Ambulatory Visit: Payer: Self-pay

## 2019-07-30 MED ORDER — MEDROXYPROGESTERONE ACETATE 150 MG/ML IM SUSP: 150.0000 mg | Freq: Once | INTRAMUSCULAR | 2 refills | Status: DC

## 2019-09-18 ENCOUNTER — Other Ambulatory Visit: Payer: Self-pay | Admitting: Family Medicine

## 2019-09-20 ENCOUNTER — Other Ambulatory Visit (HOSPITAL_COMMUNITY): Payer: Self-pay | Admitting: *Deleted

## 2019-09-20 ENCOUNTER — Other Ambulatory Visit: Payer: Self-pay

## 2019-09-20 ENCOUNTER — Emergency Department (HOSPITAL_COMMUNITY)
Admission: EM | Admit: 2019-09-20 | Discharge: 2019-09-20 | Disposition: A | Discharge status: Home / Self Care | Payer: Commercial Managed Care - PPO | Attending: Emergency Medicine | Admitting: Emergency Medicine

## 2019-09-20 ENCOUNTER — Encounter (HOSPITAL_COMMUNITY): Payer: Self-pay

## 2019-09-20 DIAGNOSIS — E669 Obesity, unspecified: Secondary | ICD-10-CM | POA: Diagnosis not present

## 2019-09-20 DIAGNOSIS — E039 Hypothyroidism, unspecified: Secondary | ICD-10-CM | POA: Diagnosis not present

## 2019-09-20 DIAGNOSIS — Z7982 Long term (current) use of aspirin: Secondary | ICD-10-CM | POA: Diagnosis not present

## 2019-09-20 DIAGNOSIS — Z79890 Hormone replacement therapy (postmenopausal): Secondary | ICD-10-CM | POA: Diagnosis not present

## 2019-09-20 DIAGNOSIS — I4891 Unspecified atrial fibrillation: Secondary | ICD-10-CM | POA: Insufficient documentation

## 2019-09-20 DIAGNOSIS — I10 Essential (primary) hypertension: Secondary | ICD-10-CM | POA: Insufficient documentation

## 2019-09-20 DIAGNOSIS — F17210 Nicotine dependence, cigarettes, uncomplicated: Secondary | ICD-10-CM | POA: Insufficient documentation

## 2019-09-20 HISTORY — DX: Unspecified atrial fibrillation: I48.91

## 2019-09-20 LAB — CBC WITH DIFFERENTIAL/PLATELET
Abs Immature Granulocytes: 0.03 10*3/uL (ref 0.00–0.07)
Basophils Absolute: 0.1 10*3/uL (ref 0.0–0.1)
Basophils Relative: 1 %
Eosinophils Absolute: 0.2 10*3/uL (ref 0.0–0.5)
Eosinophils Relative: 2 %
HCT: 48.2 % — ABNORMAL HIGH (ref 36.0–46.0)
Hemoglobin: 15.6 g/dL — ABNORMAL HIGH (ref 12.0–15.0)
Immature Granulocytes: 0 %
Lymphocytes Relative: 35 %
Lymphs Abs: 3.7 10*3/uL (ref 0.7–4.0)
MCH: 27.2 pg (ref 26.0–34.0)
MCHC: 32.4 g/dL (ref 30.0–36.0)
MCV: 84.1 fL (ref 80.0–100.0)
Monocytes Absolute: 0.8 10*3/uL (ref 0.1–1.0)
Monocytes Relative: 8 %
Neutro Abs: 5.8 10*3/uL (ref 1.7–7.7)
Neutrophils Relative %: 54 %
Platelets: 285 10*3/uL (ref 150–400)
RBC: 5.73 MIL/uL — ABNORMAL HIGH (ref 3.87–5.11)
RDW: 13.6 % (ref 11.5–15.5)
WBC: 10.6 10*3/uL — ABNORMAL HIGH (ref 4.0–10.5)
nRBC: 0 % (ref 0.0–0.2)

## 2019-09-20 LAB — BASIC METABOLIC PANEL
Anion gap: 14 (ref 5–15)
BUN: 9 mg/dL (ref 6–20)
CO2: 22 mmol/L (ref 22–32)
Calcium: 9.4 mg/dL (ref 8.9–10.3)
Chloride: 106 mmol/L (ref 98–111)
Creatinine, Ser: 0.74 mg/dL (ref 0.44–1.00)
GFR calc Af Amer: >60 mL/min (ref >60)
GFR calc non Af Amer: >60 mL/min (ref >60)
Glucose, Bld: 119 mg/dL — ABNORMAL HIGH (ref 70–99)
Potassium: 3.5 mmol/L (ref 3.5–5.1)
Sodium: 142 mmol/L (ref 135–145)

## 2019-09-20 MED ORDER — METOPROLOL SUCCINATE ER 50 MG PO TB24: 50.0000 mg | ORAL_TABLET | Freq: Every day | ORAL | 0 refills | Status: DC

## 2019-09-20 MED ORDER — SODIUM CHLORIDE 0.9 % IV BOLUS
500.0000 mL | Freq: Once | INTRAVENOUS | Status: AC
  Administered 2019-09-20: 500 mL via INTRAVENOUS

## 2019-09-20 NOTE — ED Provider Notes (Signed)
[facility] EMERGENCY DEPARTMENT Provider Note  CSN: [id number] Arrival date & time: 09/20/19 0549  Chief Complaint(s) Atrial Fibrillation  HPI Emily Maldonado is a 44 y.o. female with a past medical history listed below including hypothyroidism who presents to the emergency department with sudden onset sensation of anxiety noted to be in A. fib with rapid heart rate.  Patient reports a single episode several years ago of A. fib requiring diltiazem and Eliquis.  Patient is on metoprolol.  She was placed on Eliquis for 1 month and has been off since.  She denied any recurrent issues since then.  Denies any recent fevers or infections.  No recent nausea or vomiting.  No related chest pain or shortness of breath.  She does report a history of mild electrolyte derangements but reports that she has been taking her supplements as she is supposed to.  Reports that prior to coming to the ED, she took an additional dose of her metoprolol.  HPI  Past Medical History Past Medical History:  Diagnosis Date  . A-fib (HCC)   . Abnormal Pap smear of cervix   . Anemia    b12  . Anxiety   . B12 deficiency anemia   . Childhood asthma   . Former tobacco use   . Hypertension   . Hypothyroidism   . Migraines    aura  . Morbid obesity (HCC)   . Ovarian cyst   . Sleep apnea    Use C-pap    Patient Active Problem List   Diagnosis Date Noted  . Low vitamin D level 01/18/2019  . Nicotine dependence with current use 11/17/2018  . Paroxysmal atrial fibrillation (HCC) 11/16/2018  . 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 Pulmonary nodule - needs repeat 12/2018 12/19/2017  . Low vitamin B12 level 12/05/2017  . PVC's (premature ventricular contractions)   . Morbid obesity (HCC)   . Acne 08/22/2017  . Hypothyroidism 08/22/2017  . Hypertension 08/22/2017   Home Medication(s) Prior to Admission medications   Medication Sig Start Date End Date Taking? Authorizing Provider   aspirin 325 MG tablet Take 325 mg by mouth daily.   Yes [provider]  clindamycin (CLINDAGEL) 1 % gel Apply topically 2 (two) times daily. Patient taking differently: Apply 1 application topically 2 (two) times daily as needed (pt preference).  11/17/18  Yes Ardith Dark, MD  cyanocobalamin (,VITAMIN B-12,) 1000 MCG/ML injection Inject 1 mL (1,000 mcg total) into the muscle every 14 (fourteen) days. IM Daily x 6 days, then IM weekly x 1 month, then monthly 11/17/18  Yes Ardith Dark, MD  ferrous sulfate 325 (65 FE) MG tablet Take 325 mg by mouth every other day.   Yes [provider]  levothyroxine (SYNTHROID) 112 MCG tablet TAKE 1 TABLET BY MOUTH DAILY ON AN EMPTY STOMACH 05/07/19  Yes Ardith Dark, MD  magnesium oxide (MAG-OX) 400 MG tablet Take 400 mg by mouth daily.   Yes [provider]  medroxyPROGESTERone (DEPO-PROVERA) 150 MG/ML injection Inject 1 mL (150 mg total) into the muscle once for 1 dose. 07/30/19 09/20/19 Yes Ardith Dark, MD  metoprolol succinate (TOPROL-XL) 50 MG 24 hr tablet Take 1 tablet (50 mg total) by mouth daily. Appointment Required For Further Refills [phone] 06/07/19  Yes Fenton, Clint R, PA  omeprazole (PRILOSEC) 40 MG capsule TAKE 1 CAPSULE BY MOUTH DAILY 03/08/19  Yes Ardith Dark, MD  Potassium 99 MG TABS  Take 99 mg by mouth every other day.   Yes [provider]  VITAMIN D PO Take by mouth. 10,000 IU daily   Yes [provider]  SYRINGE-NEEDLE, DISP, 3 ML 22G X 1-1/2" 3 ML MISC Use daily as directed. 05/12/19   Ardith Dark, MD  varenicline (CHANTIX CONTINUING MONTH PAK) 1 MG tablet Take 1 tablet (1 mg total) by mouth 2 (two) times daily. Patient not taking: Reported on 09/20/2019 01/15/19   Ardith Dark, MD                                                                                                                                    Past Surgical History Past Surgical  History:  Procedure Laterality Date  . BREAST BIOPSY Left 2014  . BREAST EXCISIONAL BIOPSY Right   . CERVICAL BIOPSY  W/ LOOP ELECTRODE EXCISION    . LEFT HEART CATH AND CORONARY ANGIOGRAPHY N/A 12/04/2017   Procedure: LEFT HEART CATH AND CORONARY ANGIOGRAPHY;  Surgeon: Kathleene Hazel, MD;  Location: [facility] INVASIVE CV LAB;  Service: Cardiovascular;  Laterality: N/A;  . TONSILLECTOMY    . TUBAL LIGATION     Family History Family History  Problem Relation Age of Onset  . Breast cancer Mother 73  . Heart failure Mother   . Atrial fibrillation Mother   . Cervical cancer Mother   . Diabetes Mother   . Hypertension Mother   . Heart failure Father   . Hypertension Father   . Heart failure Maternal Grandmother   . Atrial fibrillation Maternal Grandmother   . Stroke Maternal Grandmother   . Uterine cancer Maternal Grandmother   . Diabetes Brother   . Hypertension Brother   . Uterine cancer Maternal Aunt     Social History Social History   Tobacco Use  . Smoking status: Current Every Day Smoker    Packs/day: 1.00    Types: Cigarettes  . Smokeless tobacco: Never Used  Substance Use Topics  . Alcohol use: Not Currently  . Drug use: No   Allergies Penicillins, Shellfish allergy, and Zithromax [azithromycin]  Review of Systems Review of Systems All other systems are reviewed and are negative for acute change except as noted in the HPI  Physical Exam Vital Signs  I have reviewed the triage vital signs BP 138/82   Pulse (!) 201  Temp 97.8 F (36.6 C)   Resp 18   SpO2 99%   Physical Exam Vitals reviewed.  Constitutional:      General: She is not in acute distress.    Appearance: She is well-developed. She is obese. She is diaphoretic.  HENT:     Head: Normocephalic and atraumatic.     Nose: Nose normal.  Eyes:     General: No scleral icterus.       Right eye: No discharge.        Left eye: No discharge.  Conjunctiva/sclera: Conjunctivae normal.      Pupils: Pupils are equal, round, and reactive to light.  Cardiovascular:     Rate and Rhythm: Tachycardia present. Rhythm irregularly irregular.     Heart sounds: No murmur. No friction rub. No gallop.   Pulmonary:     Effort: Pulmonary effort is normal. No respiratory distress.     Breath sounds: Normal breath sounds. No stridor. No rales.  Abdominal:     General: There is no distension.     Palpations: Abdomen is soft.     Tenderness: There is no abdominal tenderness.  Musculoskeletal:        General: No tenderness.     Cervical back: Normal range of motion and neck supple.  Skin:    General: Skin is warm.     Findings: No erythema or rash.  Neurological:     Mental Status: She is alert and oriented to person, place, and time.     ED Results and Treatments Labs (all labs ordered are listed, but only abnormal results are displayed) Labs Reviewed  CBC WITH DIFFERENTIAL/PLATELET - Abnormal; Notable for the following components:      Result Value   WBC 10.6 (*)    RBC 5.73 (*)    Hemoglobin 15.6 (*)    HCT 48.2 (*)    All other components within normal limits  BASIC METABOLIC PANEL - Abnormal; Notable for the following components:   Glucose, Bld 119 (*)    All other components within normal limits                                                                                                                         EKG  EKG Interpretation  Date/Time:  Monday September 20 2019 05:57:19 EDT Ventricular Rate:  207 PR Interval:    QRS Duration: 91 QT Interval:  268 QTC Calculation: 498 R Axis:   92 Text Interpretation: Atrial fibrillation with rapid V-rate Borderline right axis deviation Low voltage, precordial leads Repolarization abnormality, prob rate related Confirmed by Drema Pry [id number]) on 09/20/2019 6:23:33 AM          Radiology No results found.  Pertinent labs & imaging results that were available during my care of the patient were reviewed by me and  considered in my medical decision making (see chart for details).  Medications Ordered in ED Medications  sodium chloride 0.9 % bolus 500 mL (500 mLs Intravenous New Bag/Given 09/20/19 0639)  Procedures Procedures  (including critical care time)  Medical Decision Making / ED Course I have reviewed the nursing notes for this encounter and the patient's prior records (if available in EHR or on provided paperwork).   Emily Maldonado was evaluated in Emergency Department on 09/20/2019 for the symptoms described in the history of present illness. She was evaluated in the context of the global COVID-19 pandemic, which necessitated consideration that the patient might be at risk for infection with the SARS-CoV-2 virus that causes COVID-19. Institutional protocols and algorithms that pertain to the evaluation of patients at risk for COVID-19 are in a state of rapid change based on information released by regulatory bodies including the CDC and federal and state organizations. These policies and algorithms were followed during the patient's care in the ED.  Patient presents in A. fib RVR.  She is hemodynamically stable and other than palpitations and diaphoresis she denies any chest pain or shortness of breath.  Discussed options including electrical cardioversion given the known onset.  While discussing options, I had patient take a deep breath which seemed to slow her rate and convert her to normal sinus rhythm.  Patient was started on IV fluids and screening labs were obtained to assess for any anemia or electrolyte derangements.  We will continue to monitor for recurrence.  Labs notable for hemoconcentration. No electrolyte derangements.       Final Clinical Impression(s) / ED Diagnoses Final diagnoses:  Atrial fibrillation with RVR ([place])   The patient appears  reasonably screened and/or stabilized for discharge and I doubt any other medical condition or other [facility] requiring further screening, evaluation, or treatment in the ED at this time prior to discharge. Safe for discharge with strict return precautions.  Disposition: Discharge  Condition: Good  I have discussed the results, Dx and Tx plan with the patient/family who expressed understanding and agree(s) with the plan. Discharge instructions discussed at length. The patient/family was given strict return precautions who verbalized understanding of the instructions. No further questions at time of discharge.    ED Discharge Orders         Ordered    Amb Referral to AFIB Clinic     09/20/19 0710             This chart was dictated using voice recognition software.  Despite best efforts to proofread,  errors can occur which can change the documentation meaning.   Fatima Blank, MD 09/20/19 [phone]

## 2019-09-20 NOTE — ED Provider Notes (Signed)
44 yo F with a chief complaint of A. fib with RVR.  Spontaneously converted while in the ED.  Plan for reassessment post IV fluids.  Patient continues to be in normal sinus rhythm requesting discharge home.  Have her follow-up in A. fib clinic.  CHA2DS2/VAS Stroke Risk Points  Current as of 22 minutes ago     2 >= 2 Points: High Risk  1 - 1.99 Points: Medium Risk  0 Points: Low Risk    The patient's score has not changed in the past year.: No Change     Details    This score determines the patient's risk of having a stroke if the  patient has atrial fibrillation.       Points Metrics  0 Has Congestive Heart Failure:  No    Current as of 22 minutes ago  0 Has Vascular Disease:  No    Current as of 22 minutes ago  1 Has Hypertension:  Yes    Current as of 22 minutes ago  0 Age:  26    Current as of 22 minutes ago  0 Has Diabetes:  No    Current as of 22 minutes ago  0 Had Stroke:  No  Had TIA:  No  Had thromboembolism:  No    Current as of 22 minutes ago  1 Female:  Yes    Current as of 22 minutes ago             Melene Plan, DO 09/20/19 [phone]

## 2019-09-20 NOTE — ED Notes (Addendum)
Pt to ED Rm 29 from [facility].  Placed on continuous monitors. Pt in Afib RVR with rate in 200s. A&OX4, speaking in full sentences upon arrival. Breathing easy, non-labored.  Pt reports she woke up this morning. States she felt like she was "having a panic attack." Pt is a Engineer, civil (consulting). Denies CP/SOB. States she had episode of Afib last May and converted with cardizem.  Dr. Eudelia Bunch at bedside, pt noted to self converted to NSR with rate in 90s. Repeat EKG completed. PIV 20 G in L forearm. Flushes without s/s of infiltration, positive blood return noted. Secured with tape and tegaderm PIV, 18 G to R forearm. Flushes without s/s of infiltration. Positive blood return noted. Secured with tape and tegaderm Bloodwork labeled with 2 pt identifiers and sent to lab

## 2019-09-20 NOTE — Discharge Instructions (Addendum)
Follow up with the afib clinic.  Return for repeat event.

## 2019-09-20 NOTE — ED Triage Notes (Signed)
Pt in w/afib, rate in 200's. States hx of same, woke feeling PVC's and anxiety. She took 75mg  Metoprolol XR PTA. Denies any cp

## 2019-09-20 NOTE — ED Notes (Signed)
Care endorsed to Liz, RN 

## 2019-09-24 ENCOUNTER — Encounter: Payer: Self-pay | Admitting: Certified Nurse Midwife

## 2019-09-28 ENCOUNTER — Other Ambulatory Visit: Payer: Self-pay

## 2019-09-28 ENCOUNTER — Ambulatory Visit (HOSPITAL_COMMUNITY)
Admission: RE | Admit: 2019-09-28 | Discharge: 2019-09-28 | Disposition: A | Discharge status: Home / Self Care | Payer: Commercial Managed Care - PPO | Source: Ambulatory Visit | Attending: Physician Assistant | Admitting: Physician Assistant

## 2019-09-28 ENCOUNTER — Encounter (HOSPITAL_COMMUNITY): Payer: Self-pay | Admitting: Physician Assistant

## 2019-09-28 VITALS — BP 144/82 | HR 86 | Ht 65.0 in | Wt 262.6 lb

## 2019-09-28 DIAGNOSIS — I480 Paroxysmal atrial fibrillation: Secondary | ICD-10-CM

## 2019-09-28 DIAGNOSIS — Z7901 Long term (current) use of anticoagulants: Secondary | ICD-10-CM | POA: Diagnosis not present

## 2019-09-28 DIAGNOSIS — Z79899 Other long term (current) drug therapy: Secondary | ICD-10-CM | POA: Insufficient documentation

## 2019-09-28 DIAGNOSIS — F17210 Nicotine dependence, cigarettes, uncomplicated: Secondary | ICD-10-CM | POA: Insufficient documentation

## 2019-09-28 DIAGNOSIS — I10 Essential (primary) hypertension: Secondary | ICD-10-CM | POA: Diagnosis not present

## 2019-09-28 DIAGNOSIS — G4733 Obstructive sleep apnea (adult) (pediatric): Secondary | ICD-10-CM | POA: Diagnosis not present

## 2019-09-28 DIAGNOSIS — Z6841 Body Mass Index (BMI) 40.0 and over, adult: Secondary | ICD-10-CM | POA: Insufficient documentation

## 2019-09-28 DIAGNOSIS — I4892 Unspecified atrial flutter: Secondary | ICD-10-CM | POA: Insufficient documentation

## 2019-09-28 DIAGNOSIS — E669 Obesity, unspecified: Secondary | ICD-10-CM | POA: Insufficient documentation

## 2019-09-28 DIAGNOSIS — Z7982 Long term (current) use of aspirin: Secondary | ICD-10-CM | POA: Insufficient documentation

## 2019-09-28 DIAGNOSIS — D519 Vitamin B12 deficiency anemia, unspecified: Secondary | ICD-10-CM | POA: Diagnosis not present

## 2019-09-28 DIAGNOSIS — E039 Hypothyroidism, unspecified: Secondary | ICD-10-CM | POA: Insufficient documentation

## 2019-09-28 MED ORDER — METOPROLOL TARTRATE 25 MG PO TABS: ORAL_TABLET | ORAL | 11 refills | Status: DC

## 2019-09-28 MED ORDER — METOPROLOL SUCCINATE ER 50 MG PO TB24: 50.0000 mg | ORAL_TABLET | Freq: Every day | ORAL | 0 refills | Status: DC

## 2019-09-28 MED ORDER — FLECAINIDE ACETATE 50 MG PO TABS: 50.0000 mg | ORAL_TABLET | Freq: Two times a day (BID) | ORAL | 0 refills | Status: DC

## 2019-09-28 NOTE — Progress Notes (Signed)
Primary Care Physician: Vivi Barrack, MD Primary Cardiologist: Dr Meda Coffee Primary Electrophysiologist: none Referring Physician: [facility]   Emily Maldonado is a 44 y.o. female with a history of hypothyroidism, tobacco abuse, HTN, OSA on CPAP, obesity, anxiety and paroxysmal fibrillation and atrial flutter who presents for follow up in the [facility]. Patient was diagnosed with afib on 11/16/18 after presenting to the ER with palpitations. ECG showed afib with RVR and she converted to SR with diltiazem. Patient has a CHADS2VASC score of 2. Patient woke with symptoms of heart racing on 09/20/19 and presented to the ER in rapid afib. She did take an extra dose of BB. She converted to SR in the ER without intervention. There were no triggers that she could identify. She denies alcohol use and is compliant with her CPAP.  Today, she denies symptoms of chest pain, shortness of breath, orthopnea, PND, lower extremity edema, dizziness, presyncope, syncope, snoring, daytime somnolence, bleeding, or neurologic sequela. The patient is tolerating medications without difficulties and is otherwise without complaint today.    Atrial Fibrillation Risk Factors:  she does have symptoms or diagnosis of sleep apnea. she is compliant with CPAP therapy. she does not have a history of rheumatic fever. she does not have a history of alcohol use. The patient does have a history of early familial atrial fibrillation or other arrhythmias. Most of her immediate family members have afib.   she has a BMI of Body mass index is 43.7 kg/m.[name] Filed Weights   09/28/19 0843  Weight: 119.1 kg    Family History  Problem Relation Age of Onset  . Breast cancer Mother 27  . Heart failure Mother   . Atrial fibrillation Mother   . Cervical cancer Mother   . Diabetes Mother   . Hypertension Mother   . Heart failure Father   . Hypertension Father   . Heart failure Maternal Grandmother     . Atrial fibrillation Maternal Grandmother   . Stroke Maternal Grandmother   . Uterine cancer Maternal Grandmother   . Diabetes Brother   . Hypertension Brother   . Uterine cancer Maternal Aunt      Atrial Fibrillation Management history:  Previous antiarrhythmic drugs: none Previous cardioversions: none Previous ablations: none CHADS2VASC score: 2 Anticoagulation history: Eliquis   Past Medical History:  Diagnosis Date  . A-fib ([place])   . Abnormal Pap smear of cervix   . Anemia    b12  . Anxiety   . B12 deficiency anemia   . Childhood asthma   . Former tobacco use   . Hypertension   . Hypothyroidism   . Migraines    aura  . Morbid obesity ([place])   . Ovarian cyst   . Sleep apnea    Use C-pap    Past Surgical History:  Procedure Laterality Date  . BREAST BIOPSY Left 2014  . BREAST EXCISIONAL BIOPSY Right   . CERVICAL BIOPSY  W/ LOOP ELECTRODE EXCISION    . LEFT HEART CATH AND CORONARY ANGIOGRAPHY N/A 12/04/2017   Procedure: LEFT HEART CATH AND CORONARY ANGIOGRAPHY;  Surgeon: Burnell Blanks, MD;  Location: [place] CV LAB;  Service: Cardiovascular;  Laterality: N/A;  . TONSILLECTOMY    . TUBAL LIGATION      Current Outpatient Medications  Medication Sig Dispense Refill  . aspirin 325 MG tablet Take 325 mg by mouth daily.    . clindamycin (CLINDAGEL) 1 % gel Apply topically 2 (two)  times daily. (Patient taking differently: Apply 1 application topically 2 (two) times daily as needed (pt preference). ) 60 g 0  . cyanocobalamin (,VITAMIN B-12,) 1000 MCG/ML injection Inject 1 mL (1,000 mcg total) into the muscle every 14 (fourteen) days. IM Daily x 6 days, then IM weekly x 1 month, then monthly 30 mL 0  . ferrous sulfate 325 (65 FE) MG tablet Take 325 mg by mouth every other day.    . levothyroxine (SYNTHROID) 112 MCG tablet TAKE 1 TABLET BY MOUTH DAILY ON AN EMPTY STOMACH 30 tablet 1  . magnesium oxide (MAG-OX) 400 MG tablet Take 400  mg by mouth daily.    . medroxyPROGESTERone (DEPO-PROVERA) 150 MG/ML injection Inject 1 mL (150 mg total) into the muscle once for 1 dose. 1 mL 2  . metoprolol succinate (TOPROL-XL) 50 MG 24 hr tablet Take 1 tablet (50 mg total) by mouth daily. 30 tablet 0  . omeprazole (PRILOSEC) 40 MG capsule TAKE 1 CAPSULE BY MOUTH DAILY 30 capsule 5  . Potassium 99 MG TABS Take 99 mg by mouth every other day.    . SYRINGE-NEEDLE, DISP, 3 ML 22G X 1-1/2" 3 ML MISC Use daily as directed. 50 each 0  . varenicline (CHANTIX CONTINUING MONTH PAK) 1 MG tablet Take 1 tablet (1 mg total) by mouth 2 (two) times daily. 60 tablet 3  . VITAMIN D PO Take by mouth. 10,000 IU daily    . flecainide (TAMBOCOR) 50 MG tablet Take 1 tablet (50 mg total) by mouth 2 (two) times daily. 60 tablet 0  . metoprolol tartrate (LOPRESSOR) 25 MG tablet Take 1 tablet every 6 hours as needed for HR >100 60 tablet 11   No current facility-administered medications for this encounter.    Allergies  Allergen Reactions  . Penicillins Anaphylaxis    Has patient had a PCN reaction causing immediate rash, facial/tongue/throat swelling, SOB or lightheadedness with hypotension: /No Has patient had a PCN reaction causing severe rash involving mucus membranes or skin necrosis: No Has patient had a PCN reaction that required hospitalization: No Has patient had a PCN reaction occurring within the last 10 years: No If all of the above answers are "NO", then may proceed with Cephalosporin use.   . Shellfish Allergy Anaphylaxis  . Zithromax [Azithromycin] Rash    Social History   Socioeconomic History  . Marital status: Single    Spouse name: Not on file  . Number of children: Not on file  . Years of education: Not on file  . Highest education level: Not on file  Occupational History  . Not on file  Tobacco Use  . Smoking status: Current Every Day Smoker    Packs/day: 1.00    Types: Cigarettes  . Smokeless tobacco: Never Used  . Tobacco  comment: pack a day  Substance and Sexual Activity  . Alcohol use: Yes    Alcohol/week: 1.0 standard drinks    Types: 1 Standard drinks or equivalent per week  . Drug use: No  . Sexual activity: Yes    Partners: Male    Birth control/protection: Surgical    Comment: btl  Other Topics Concern  . Not on file  Social History Narrative  . Not on file   Social Determinants of Health   Financial Resource Strain:   . Difficulty of Paying Living Expenses:   Food Insecurity:   . Worried About Programme researcher, broadcasting/film/video in the Last Year:   . Ran  Out of Food in the Last Year:   Transportation Needs:   . Lack of Transportation (Medical):   [name] Lack of Transportation (Non-Medical):   Physical Activity:   . Days of Exercise per Week:   . Minutes of Exercise per Session:   Stress:   . Feeling of Stress :   Social Connections:   . Frequency of Communication with Friends and Family:   . Frequency of Social Gatherings with Friends and Family:   . Attends Religious Services:   . Active Member of Clubs or Organizations:   . Attends Banker Meetings:   [name] Marital Status:   Intimate Partner Violence:   . Fear of Current or Ex-Partner:   . Emotionally Abused:   [name] Physically Abused:   . Sexually Abused:      ROS- All systems are reviewed and negative except as per the HPI above.  Physical Exam: Vitals:   09/28/19 0843  BP: (!) 144/82  Pulse: 86  Weight: 119.1 kg  Height: 5\' 5"  (1.651 m)    GEN- The patient is well appearing obese female, alert and oriented x 3 today.   HEENT-head normocephalic, atraumatic, sclera clear, conjunctiva pink, hearing intact, trachea midline. Lungs- Clear to ausculation bilaterally, normal work of breathing Heart- Regular rate and rhythm, no murmurs, rubs or gallops  GI- soft, NT, ND, + BS Extremities- no clubbing, cyanosis, or edema MS- no significant deformity or atrophy Skin- no rash or lesion Psych- euthymic mood, full affect Neuro- strength  and sensation are intact   Wt Readings from Last 3 Encounters:  09/28/19 119.1 kg  09/20/19 106.6 kg  03/26/19 118.8 kg    EKG today demonstrates SR HR 86, NST, PR 134, QRS 88, QTc 464  Echo 12/05/17 demonstrated  - Left ventricle: The cavity size was normal. Systolic function was normal. The estimated ejection fraction was in the range of 55% to 60%. Wall motion was normal; there were no regional wall motion abnormalities. Left ventricular diastolic function parameters were normal. - Left atrium: The atrium was moderately dilated. - Atrial septum: No defect or patent foramen ovale was identified.  LA 32mm  LHC 12/04/17 1. No angiographic evidence of CAD 2. Mild elevation LV filling pressures 3. Non-cardiac chest pain  Epic records are reviewed at length today  CHA2DS2-VASc Score = 2 The patient's score is based upon: CHF History: No HTN History: Yes Age : < 65 Diabetes History: No Stroke History: No Vascular Disease History: No Gender: Female      ASSESSMENT AND PLAN: 1. Paroxysmal Atrial Fibrillation/atrial flutter The patient's CHA2DS2-VASc score is 2, indicating a 2.2% annual risk of stroke.   We discussed therapeutic options today including flecainide and Multaq. Will start flecainide 50 mg BID. Recheck ECG in one week.  We discussed anticoagulation today. She has a diagnosis of adenomyosis and had uterine bleeding on Eliquis previously. Given her CHADS2VASC score of 2 and bleeding risk, will not resume Eliquis at this time. Patient voices understanding and is in agreement with plan.  Continue Toprol 50 mg daily Will start Lopressor 25 mg PRN q 6hours for heart racing. She is not on diltiazem.   2. HTN Stable, no changes today.  3. Obesity Body mass index is 43.7 kg/m. Lifestyle modification was discussed and encouraged including regular physical activity and weight reduction.  4. Obstructive sleep apnea The importance of adequate treatment of  sleep apnea was discussed today in order to improve our ability to maintain sinus rhythm long  term. Patient reports compliance with CPAP therapy.   Follow up in the AF clinic in one week for ECG.   Jorja Loa PA-C Afib Clinic [facility] [address] [place], [place] [address] [phone] 09/28/2019 9:21 AM

## 2019-10-04 ENCOUNTER — Other Ambulatory Visit: Payer: Self-pay

## 2019-10-04 ENCOUNTER — Ambulatory Visit (HOSPITAL_COMMUNITY)
Admission: RE | Admit: 2019-10-04 | Discharge: 2019-10-04 | Disposition: A | Discharge status: Home / Self Care | Payer: Commercial Managed Care - PPO | Source: Ambulatory Visit | Attending: Physician Assistant | Admitting: Physician Assistant

## 2019-10-04 DIAGNOSIS — I480 Paroxysmal atrial fibrillation: Secondary | ICD-10-CM

## 2019-10-04 DIAGNOSIS — R9431 Abnormal electrocardiogram [ECG] [EKG]: Secondary | ICD-10-CM | POA: Diagnosis not present

## 2019-10-04 DIAGNOSIS — Z79899 Other long term (current) drug therapy: Secondary | ICD-10-CM | POA: Insufficient documentation

## 2019-10-04 NOTE — Progress Notes (Signed)
Patient returns for ECG after starting flecainide. ECG shows SR HR 80, NST, PR 136, QRS 88, QTc 447. She has not had any afib episodes in the interim. Will arrange for TST. F/u in the AF clinic in 3 months.

## 2019-10-05 ENCOUNTER — Encounter (HOSPITAL_COMMUNITY): Payer: Commercial Managed Care - PPO | Admitting: Physician Assistant

## 2019-10-19 ENCOUNTER — Encounter: Payer: Self-pay | Admitting: Family Medicine

## 2019-10-19 ENCOUNTER — Other Ambulatory Visit: Payer: Self-pay | Admitting: Family Medicine

## 2019-10-19 ENCOUNTER — Telehealth (INDEPENDENT_AMBULATORY_CARE_PROVIDER_SITE_OTHER): Payer: Commercial Managed Care - PPO | Admitting: Family Medicine

## 2019-10-19 VITALS — Ht 65.0 in

## 2019-10-19 DIAGNOSIS — I480 Paroxysmal atrial fibrillation: Secondary | ICD-10-CM | POA: Diagnosis not present

## 2019-10-19 DIAGNOSIS — K219 Gastro-esophageal reflux disease without esophagitis: Secondary | ICD-10-CM | POA: Diagnosis not present

## 2019-10-19 DIAGNOSIS — L709 Acne, unspecified: Secondary | ICD-10-CM

## 2019-10-19 DIAGNOSIS — E039 Hypothyroidism, unspecified: Secondary | ICD-10-CM | POA: Diagnosis not present

## 2019-10-19 DIAGNOSIS — F17200 Nicotine dependence, unspecified, uncomplicated: Secondary | ICD-10-CM

## 2019-10-19 MED ORDER — PANTOPRAZOLE SODIUM 40 MG PO TBEC: 40.0000 mg | DELAYED_RELEASE_TABLET | Freq: Every day | ORAL | 3 refills | Status: DC

## 2019-10-19 MED ORDER — VARENICLINE TARTRATE 1 MG PO TABS: 1.0000 mg | ORAL_TABLET | Freq: Two times a day (BID) | ORAL | 3 refills | Status: DC

## 2019-10-19 MED ORDER — DOXYCYCLINE HYCLATE 100 MG PO TABS: 100.0000 mg | ORAL_TABLET | Freq: Two times a day (BID) | ORAL | 0 refills | Status: DC

## 2019-10-19 NOTE — Progress Notes (Signed)
   Damien Denice Bors is a 44 y.o. female who presents today for a virtual office visit.  Assessment/Plan:  Chronic Problems Addressed Today: GERD (gastroesophageal reflux disease) Uncontrolled on omeprazole.  Will start Protonix 40 mg daily.  Follow-up in a few weeks if not improving.  Paroxysmal atrial fibrillation (HCC) Continue management per cardiology.  Will be following up with him soon.  Was not able to tolerate flecainide due to side effects.  Hypothyroidism We will check TSH given recent changes in her A. fib.  She is also been having more fatigue.  Continue Synthroid 112 mcg daily for now.  Acne Worsen.  Will start course of doxycycline 100 mg twice daily.  Nicotine dependence with current use We will restart Chantix.     Subjective:  HPI:  See A/p.         Objective/Observations  Physical Exam: Gen: NAD, resting comfortably Pulm: Normal work of breathing Neuro: Grossly normal, moves all extremities Psych: Normal affect and thought content  Virtual Visit via Video   I connected with Vinson Moselle on 10/19/19 at 11:20 AM EDT by a video enabled telemedicine application and verified that I am speaking with the correct person using two identifiers. The limitations of evaluation and management by telemedicine and the availability of in person appointments were discussed. The patient expressed understanding and agreed to proceed.   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 [name], MD 10/19/2019 11:29 AM

## 2019-10-19 NOTE — Assessment & Plan Note (Signed)
Continue management per cardiology.  Will be following up with him soon.  Was not able to tolerate flecainide due to side effects.

## 2019-10-19 NOTE — Assessment & Plan Note (Signed)
We will restart Chantix.

## 2019-10-19 NOTE — Assessment & Plan Note (Signed)
Uncontrolled on omeprazole.  Will start Protonix 40 mg daily.  Follow-up in a few weeks if not improving.

## 2019-10-19 NOTE — Assessment & Plan Note (Signed)
Worsen.  Will start course of doxycycline 100 mg twice daily.

## 2019-10-19 NOTE — Assessment & Plan Note (Signed)
We will check TSH given recent changes in her A. fib.  She is also been having more fatigue.  Continue Synthroid 112 mcg daily for now.

## 2019-10-20 ENCOUNTER — Other Ambulatory Visit (HOSPITAL_COMMUNITY): Payer: Self-pay | Admitting: Physician Assistant

## 2019-11-07 DIAGNOSIS — E039 Hypothyroidism, unspecified: Secondary | ICD-10-CM | POA: Diagnosis not present

## 2019-11-07 DIAGNOSIS — F17210 Nicotine dependence, cigarettes, uncomplicated: Secondary | ICD-10-CM | POA: Diagnosis not present

## 2019-11-07 DIAGNOSIS — Z79899 Other long term (current) drug therapy: Secondary | ICD-10-CM | POA: Diagnosis not present

## 2019-11-07 DIAGNOSIS — I4892 Unspecified atrial flutter: Secondary | ICD-10-CM | POA: Insufficient documentation

## 2019-11-07 DIAGNOSIS — R079 Chest pain, unspecified: Secondary | ICD-10-CM | POA: Diagnosis not present

## 2019-11-07 DIAGNOSIS — Z7982 Long term (current) use of aspirin: Secondary | ICD-10-CM | POA: Diagnosis not present

## 2019-11-07 DIAGNOSIS — I10 Essential (primary) hypertension: Secondary | ICD-10-CM | POA: Diagnosis not present

## 2019-11-07 DIAGNOSIS — R002 Palpitations: Secondary | ICD-10-CM | POA: Diagnosis present

## 2019-11-08 ENCOUNTER — Telehealth (HOSPITAL_COMMUNITY): Payer: Self-pay

## 2019-11-08 ENCOUNTER — Other Ambulatory Visit: Payer: Self-pay

## 2019-11-08 ENCOUNTER — Emergency Department (HOSPITAL_COMMUNITY)
Admission: EM | Admit: 2019-11-08 | Discharge: 2019-11-08 | Disposition: A | Discharge status: Home / Self Care | Payer: Commercial Managed Care - PPO | Attending: Emergency Medicine | Admitting: Emergency Medicine

## 2019-11-08 ENCOUNTER — Encounter (HOSPITAL_COMMUNITY): Payer: Self-pay | Admitting: Emergency Medicine

## 2019-11-08 DIAGNOSIS — I4892 Unspecified atrial flutter: Secondary | ICD-10-CM

## 2019-11-08 LAB — MAGNESIUM: Magnesium: 1.6 mg/dL — ABNORMAL LOW (ref 1.7–2.4)

## 2019-11-08 LAB — CBC
HCT: 45 % (ref 36.0–46.0)
Hemoglobin: 14.7 g/dL (ref 12.0–15.0)
MCH: 27.7 pg (ref 26.0–34.0)
MCHC: 32.7 g/dL (ref 30.0–36.0)
MCV: 84.7 fL (ref 80.0–100.0)
Platelets: 289 10*3/uL (ref 150–400)
RBC: 5.31 MIL/uL — ABNORMAL HIGH (ref 3.87–5.11)
RDW: 14 % (ref 11.5–15.5)
WBC: 12.2 10*3/uL — ABNORMAL HIGH (ref 4.0–10.5)
nRBC: 0 % (ref 0.0–0.2)

## 2019-11-08 LAB — BASIC METABOLIC PANEL
Anion gap: 10 (ref 5–15)
BUN: 11 mg/dL (ref 6–20)
CO2: 20 mmol/L — ABNORMAL LOW (ref 22–32)
Calcium: 8.8 mg/dL — ABNORMAL LOW (ref 8.9–10.3)
Chloride: 109 mmol/L (ref 98–111)
Creatinine, Ser: 0.84 mg/dL (ref 0.44–1.00)
GFR calc Af Amer: >60 mL/min (ref >60)
GFR calc non Af Amer: >60 mL/min (ref >60)
Glucose, Bld: 124 mg/dL — ABNORMAL HIGH (ref 70–99)
Potassium: 3.7 mmol/L (ref 3.5–5.1)
Sodium: 139 mmol/L (ref 135–145)

## 2019-11-08 LAB — TROPONIN I (HIGH SENSITIVITY)
Troponin I (High Sensitivity): 17 ng/L (ref <18)
Troponin I (High Sensitivity): 26 ng/L — ABNORMAL HIGH (ref <18)

## 2019-11-08 MED ORDER — MAGNESIUM SULFATE 2 GM/50ML IV SOLN
2.0000 g | Freq: Once | INTRAVENOUS | Status: AC
  Administered 2019-11-08: 2 g via INTRAVENOUS
  Filled 2019-11-08: qty 50

## 2019-11-08 MED ORDER — SODIUM CHLORIDE 0.9 % IV BOLUS
1000.0000 mL | Freq: Once | INTRAVENOUS | Status: AC
  Administered 2019-11-08: 03:00:00 1000 mL via INTRAVENOUS

## 2019-11-08 NOTE — ED Provider Notes (Signed)
[facility] EMERGENCY DEPARTMENT Provider Note   CSN: [id number] Arrival date & time: 11/07/19  2357     History Chief Complaint  Patient presents with  . Chest Pain  . Palpitations    Emily Maldonado is a 44 y.o. female with a history of paroxysmal atrial fibrillation and atrial flutter NOT on anticoagulation, hypothyroidism, HTN, migraines, anxiety who presents to the emergency department with a chief complaint of palpitations.  The patient reports that she began having palpitations and central chest pain yesterday.  The patient reports that she had flown to [place] visiting her significant other's son when the symptoms began.  Reports that her heart rate increased up to 205.  She attempted to take metoprolol to reduce her heart rate without improvement.  States that she has had previous episodes of A. fib and a flutter that have been associated with changes in her thyroid and abnormal potassium and magnesium.  She was unable to get her heart rate below 170 so she went to the ER in [place]. [place]. She had a CT PE study that was negative.  TSH was normal.  She was given 10 of diltiazem x2. Reports that they had discussed starting on her a diltiazem infusion, but she decided to leave.   Her rate continued to be 170 this morning. She has been taking metoprolol every 6 hours. Last dose of 50 mg at 15:30. Minimal improvement with rate.  She continues to have a central chest pain.  Patient does have a history of paroxysmal atrial fibrillation and is not currently anticoagulated due to uterine bleeding on Eliquis.  Patient was reportedly started on flecainide, but discontinued the medication after a couple of weeks due to side effects.   The history is provided by the patient and medical records. No language interpreter was used.       Past Medical History:  Diagnosis Date  . A-fib (HCC)   . Abnormal Pap smear of cervix   . Anemia    b12  . Anxiety   . B12 deficiency  anemia   . Childhood asthma   . Former tobacco use   . Hypertension   . Hypothyroidism   . Migraines    aura  . Morbid obesity (HCC)   . Ovarian cyst   . Sleep apnea    Use C-pap     Patient Active Problem List   Diagnosis Date Noted  . GERD (gastroesophageal reflux disease) 10/19/2019  . Low vitamin D level 01/18/2019  . Nicotine dependence with current use 11/17/2018  . Paroxysmal atrial fibrillation (HCC) 11/16/2018  . 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 Pulmonary nodule - needs repeat 12/2018 12/19/2017  . Low vitamin B12 level 12/05/2017  . PVC's (premature ventricular contractions)   . Morbid obesity (HCC)   . Acne 08/22/2017  . Hypothyroidism 08/22/2017  . Hypertension 08/22/2017    Past Surgical History:  Procedure Laterality Date  . BREAST BIOPSY Left 2014  . BREAST EXCISIONAL BIOPSY Right   . CERVICAL BIOPSY  W/ LOOP ELECTRODE EXCISION    . LEFT HEART CATH AND CORONARY ANGIOGRAPHY N/A 12/04/2017   Procedure: LEFT HEART CATH AND CORONARY ANGIOGRAPHY;  Surgeon: Kathleene Hazel, MD;  Location: [facility] INVASIVE CV LAB;  Service: Cardiovascular;  Laterality: N/A;  . TONSILLECTOMY    . TUBAL LIGATION       OB History    Gravida  5   Para      Term  Preterm      AB  2   Living  3     SAB  1   TAB  1   Ectopic      Multiple      Live Births  3           Family History  Problem Relation Age of Onset  . Breast cancer Mother 85  . Heart failure Mother   . Atrial fibrillation Mother   . Cervical cancer Mother   . Diabetes Mother   . Hypertension Mother   . Heart failure Father   . Hypertension Father   . Heart failure Maternal Grandmother   . Atrial fibrillation Maternal Grandmother   . Stroke Maternal Grandmother   . Uterine cancer Maternal Grandmother   . Diabetes Brother   . Hypertension Brother   . Uterine cancer Maternal Aunt     Social History   Tobacco Use  . Smoking status: Current Every Day Smoker      Packs/day: 1.00    Types: Cigarettes  . Smokeless tobacco: Never Used  . Tobacco comment: pack a day  Substance Use Topics  . Alcohol use: Yes    Alcohol/week: 1.0 standard drinks    Types: 1 Standard drinks or equivalent per week  . Drug use: No    Home Medications Prior to Admission medications   Medication Sig Start Date End Date Taking? Authorizing Provider  aspirin 325 MG tablet Take 325 mg by mouth daily.   Yes [provider]  cyanocobalamin (,VITAMIN B-12,) 1000 MCG/ML injection Inject 1 mL (1,000 mcg total) into the muscle every 14 (fourteen) days. IM Daily x 6 days, then IM weekly x 1 month, then monthly Patient taking differently: Inject 1,000 mcg into the muscle every 14 (fourteen) days.  11/17/18  Yes Ardith Dark, MD  ferrous sulfate 325 (65 FE) MG tablet Take 325 mg by mouth every other day.   Yes [provider]  levothyroxine (SYNTHROID) 112 MCG tablet TAKE 1 TABLET EVERY DAY ON AN EMPTY STOMACH Patient taking differently: Take 112 mcg by mouth daily before breakfast.  10/19/19  Yes Ardith Dark, MD  magnesium oxide (MAG-OX) 400 MG tablet Take 400 mg by mouth daily.   Yes [provider]  medroxyPROGESTERone (DEPO-PROVERA) 150 MG/ML injection Inject 1 mL (150 mg total) into the muscle once for 1 dose. Patient taking differently: Inject 150 mg into the muscle every 3 (three) months.  07/30/19 11/07/28 Yes Ardith Dark, MD  metoprolol succinate (TOPROL-XL) 50 MG 24 hr tablet TAKE 1 TABLET BY MOUTH EVERY DAY Patient taking differently: Take 50 mg by mouth daily.  10/20/19  Yes Fenton, Clint R, PA  metoprolol tartrate (LOPRESSOR) 25 MG tablet Take 1 tablet every 6 hours as needed for HR >100 09/28/19  Yes Fenton, Clint R, PA  pantoprazole (PROTONIX) 40 MG tablet Take 1 tablet (40 mg total) by mouth daily. 10/19/19  Yes Ardith Dark, MD  Potassium 99 MG TABS Take 99 mg by mouth every other day.   Yes [provider]  VITAMIN D PO Take 10,000 Units by mouth daily.    Yes [provider]  SYRINGE-NEEDLE, DISP, 3 ML 22G X 1-1/2" 3 ML MISC Use daily as directed. 05/12/19   Ardith Dark, MD  flecainide (TAMBOCOR) 50 MG tablet TAKE 1 TABLET BY MOUTH TWICE A DAY Patient not taking: Reported on 11/08/2019 10/20/19 11/08/19  Danice Goltz, PA  Allergies    Penicillins, Shellfish allergy, and Zithromax [azithromycin]  Review of Systems   Review of Systems  Constitutional: Negative for activity change, chills, diaphoresis, fatigue and fever.  HENT: Negative for congestion and sore throat.   Eyes: Negative for visual disturbance.  Respiratory: Negative for shortness of breath and wheezing.   Cardiovascular: Positive for chest pain and palpitations.  Gastrointestinal: Negative for abdominal pain, diarrhea, nausea and vomiting.  Genitourinary: Negative for dysuria.  Musculoskeletal: Negative for back pain, myalgias, neck pain and neck stiffness.  Skin: Negative for rash.  Allergic/Immunologic: Negative for immunocompromised state.  Neurological: Negative for seizures, syncope, weakness, numbness and headaches.  Psychiatric/Behavioral: Negative for confusion.    Physical Exam Updated Vital Signs BP (!) 169/70   Pulse 77   Temp 98.4 F (36.9 C) (Oral)   Resp 19   Ht 5\' 5"  (1.651 m)   Wt 130 kg   SpO2 97%   BMI 47.69 kg/m   Physical Exam Vitals and nursing note reviewed.  Constitutional:      General: She is not in acute distress.    Appearance: She is not ill-appearing, toxic-appearing or diaphoretic.  HENT:     Head: Normocephalic.  Eyes:     Conjunctiva/sclera: Conjunctivae normal.  Cardiovascular:     Rate and Rhythm: Normal rate and regular rhythm.     Pulses: Normal pulses.     Heart sounds: Normal heart sounds. No murmur. No friction rub. No gallop.      Comments: Heart is regular rate and rhythm without murmurs rubs or gallops. Peripheral pulses are 2+ and  symmetric. Pulmonary:     Effort: Pulmonary effort is normal. No respiratory distress.     Breath sounds: No stridor. No wheezing, rhonchi or rales.  Chest:     Chest wall: No tenderness.  Abdominal:     General: There is no distension.     Palpations: Abdomen is soft.     Tenderness: There is no abdominal tenderness.  Musculoskeletal:        General: No tenderness.     Cervical back: Neck supple.     Right lower leg: No edema.     Left lower leg: No edema.  Skin:    General: Skin is warm.     Findings: No rash.  Neurological:     General: No focal deficit present.     Mental Status: She is alert.  Psychiatric:        Behavior: Behavior normal.     ED Results / Procedures / Treatments   Labs (all labs ordered are listed, but only abnormal results are displayed) Labs Reviewed  BASIC METABOLIC PANEL - Abnormal; Notable for the following components:      Result Value   CO2 20 (*)    Glucose, Bld 124 (*)    Calcium 8.8 (*)    All other components within normal limits  MAGNESIUM - Abnormal; Notable for the following components:   Magnesium 1.6 (*)    All other components within normal limits  CBC - Abnormal; Notable for the following components:   WBC 12.2 (*)    RBC 5.31 (*)    All other components within normal limits  TROPONIN I (HIGH SENSITIVITY) - Abnormal; Notable for the following components:   Troponin I (High Sensitivity) 26 (*)    All other components within normal limits  TROPONIN I (HIGH SENSITIVITY)    EKG EKG Interpretation  Date/Time:  Monday Nov 08 2019 04:26:58 EDT Ventricular  Rate:  76 PR Interval:    QRS Duration: 98 QT Interval:  418 QTC Calculation: 470 R Axis:   87 Text Interpretation: Sinus rhythm Abnormal T, consider ischemia, diffuse leads taht is worsened from previous but similar to EKG September 28 2019 QT interval has improved Confirmed by Rochele RaringWard, Kristen [phone]([id number]) on 11/08/2019 4:30:20 AM   Radiology No results  found.  Procedures .Critical Care Performed by: Barkley BoardsMcDonald, Trevaun Rendleman A, PA-C Authorized by: Barkley BoardsMcDonald, Joquan Lotz A, PA-C   Critical care provider statement:    Critical care time (minutes):  40   Critical care time was exclusive of:  Separately billable procedures and treating other patients and teaching time   Critical care was necessary to treat or prevent imminent or life-threatening deterioration of the following conditions:  Cardiac failure   Critical care was time spent personally by me on the following activities:  Ordering and performing treatments and interventions, ordering and review of laboratory studies, ordering and review of radiographic studies, pulse oximetry, re-evaluation of patient's condition, review of old charts, obtaining history from patient or surrogate, examination of patient, evaluation of patient's response to treatment and development of treatment plan with patient or surrogate   I assumed direction of critical care for this patient from another provider in my specialty: no     (including critical care time)  Medications Ordered in ED Medications  magnesium sulfate IVPB 2 g 50 mL (0 g Intravenous Stopped 11/08/19 0424)  sodium chloride 0.9 % bolus 1,000 mL (0 mLs Intravenous Stopped 11/08/19 0429)    ED Course  I have reviewed the triage vital signs and the nursing notes.  Pertinent labs & imaging results that were available during my care of the patient were reviewed by me and considered in my medical decision making (see chart for details).  Clinical Course as of Nov 08 926  Mon Nov 08, 2019  0220 Patient recheck.  Heart rate remains in the 70s to 80s.  Patient is laying comfortably in bed.  She reports that chest pain has resolved.  She is having no palpitations.  She has no complaints at this time.   [MM]  [phone] Patient recheck.  She feels very comfortable.  She is having no chest pain at this time.  Discussed the repeat EKG with improved QTC, but there are new T wave  inversions.  This does appear similar to an EKG in March.  Repeat troponin is pending.  If repeat troponin is not elevated, will plan for discharge to home.   [MM]    Clinical Course User Index [MM] Geetika Laborde, Coral ElseMia A, PA-C   MDM Rules/Calculators/A&P                      44 year old female with a history of paroxysmal atrial fibrillation and atrial flutter NOT on anticoagulation, hypothyroidism, HTN, migraines, anxiety presenting with palpitations and chest pain.  Heart rate is 147 on arrival.  Blood pressure is stable.  She is not hypoxic.  There is no tachypnea.  She is afebrile.  EKG with atrial flutter with rate with rapid ventricular response.  However, by the time that she was triaged and brought back to the room, she had converted back into sinus rhythm on repeat EKG.  QTC is prolonged on repeat EKG with sinus rhythm.  Palpitations have now resolved, but patient continues to endorse some central chest pain.  Labs are notable for hypomagnesemia of 1.6.  Given prolonged QTC, will replenish with IV magnesium in  the ER and plan to recheck EKG.  Initial troponin is normal at 17.  Bicarb is minimally elevated at 20 and CBC appears hemoconcentrated.  We will give the patient 1 L of IV fluids, particularly in the setting of a PE study with contrast in the last 24 hours to avoid kidney injury.  The patient and plan were discussed with Dr. Elesa Massed, attending physician.  Repeat EKG after IV magnesium with improved QTC, but there appear to be diffuse T wave inversion in multiple leads.  On reevaluation, patient is adamant that she is no longer having any chest discomfort.  EKG does appear similar to September 28, 2019.  Will repeat second troponin for trend.  Repeat troponin is minimally elevated at 26.  Trend is essentially flat.  This was discussed with Dr. Elesa Massed who feels that even that troponin is mildly elevated, this is likely secondary to demand given prolonged episode of a flutter, but now the patient has  been chest pain-free for multiple hours and has remained in sinus rhythm that she is safe for discharge at this time.  Doubt ACS.  She is established with cardiology and has been advised to follow closely given her ER visit today.  All questions answered.  She is hemodynamically stable in no acute distress.  Safe for discharge to home with cardiology follow-up.     This patients CHA2DS2-VASc Score and unadjusted Ischemic Stroke Rate (% per year) is equal to 2.2 % stroke rate/year from a score of 2  Above score calculated as 1 point each if present [CHF, HTN, DM, Vascular=MI/PAD/Aortic Plaque, Age if 65-74, or Female] Above score calculated as 2 points each if present [Age > 75, or Stroke/TIA/TE]    Final Clinical Impression(s) / ED Diagnoses Final diagnoses:  Atrial flutter with rapid ventricular response (HCC)  Hypomagnesemia    Rx / DC Orders ED Discharge Orders    None       Rochelle Nephew A, PA-C 11/08/19 0928    Ward, Layla Maw, DO 11/08/19 2312

## 2019-11-08 NOTE — Telephone Encounter (Signed)
Patient showed up on ED report. Reached out to see if she would like to be seen. Left vm.

## 2019-11-08 NOTE — ED Notes (Signed)
Mia PA requested another EKG after pt received magnesium sulfate infusion. EKG given to Dr. Elesa Massed.

## 2019-11-08 NOTE — ED Notes (Signed)
Family at bedside. 

## 2019-11-08 NOTE — ED Triage Notes (Signed)
Patient reports central chest pain with palpitations and SOB onset this week , denies emesis , no cough or fever .

## 2019-11-08 NOTE — Discharge Instructions (Addendum)
Thank you for allowing me to care for you today in the Emergency Department.   Please call to schedule a follow-up appoint with your cardiologist.  Your magnesium was slightly low today.  This was replenished in the ER.  Your potassium is normal.  Return to the emergency department if you develop a rapid heart rate, chest pain, respiratory distress, if you pass out, develop new numbness or weakness, or other new, concerning symptoms.

## 2019-11-10 ENCOUNTER — Other Ambulatory Visit: Payer: Self-pay

## 2019-11-10 ENCOUNTER — Ambulatory Visit (HOSPITAL_COMMUNITY)
Admission: RE | Admit: 2019-11-10 | Discharge: 2019-11-10 | Disposition: A | Discharge status: Home / Self Care | Payer: Commercial Managed Care - PPO | Source: Ambulatory Visit | Attending: Physician Assistant | Admitting: Physician Assistant

## 2019-11-10 ENCOUNTER — Encounter (HOSPITAL_COMMUNITY): Payer: Self-pay | Admitting: Physician Assistant

## 2019-11-10 VITALS — BP 150/88 | HR 88 | Ht 65.0 in | Wt 264.8 lb

## 2019-11-10 DIAGNOSIS — Z8249 Family history of ischemic heart disease and other diseases of the circulatory system: Secondary | ICD-10-CM | POA: Insufficient documentation

## 2019-11-10 DIAGNOSIS — E669 Obesity, unspecified: Secondary | ICD-10-CM | POA: Diagnosis not present

## 2019-11-10 DIAGNOSIS — Z79890 Hormone replacement therapy (postmenopausal): Secondary | ICD-10-CM | POA: Diagnosis not present

## 2019-11-10 DIAGNOSIS — E039 Hypothyroidism, unspecified: Secondary | ICD-10-CM | POA: Diagnosis not present

## 2019-11-10 DIAGNOSIS — I10 Essential (primary) hypertension: Secondary | ICD-10-CM | POA: Insufficient documentation

## 2019-11-10 DIAGNOSIS — Z9989 Dependence on other enabling machines and devices: Secondary | ICD-10-CM | POA: Diagnosis not present

## 2019-11-10 DIAGNOSIS — F419 Anxiety disorder, unspecified: Secondary | ICD-10-CM | POA: Diagnosis not present

## 2019-11-10 DIAGNOSIS — G4733 Obstructive sleep apnea (adult) (pediatric): Secondary | ICD-10-CM | POA: Insufficient documentation

## 2019-11-10 DIAGNOSIS — Z7982 Long term (current) use of aspirin: Secondary | ICD-10-CM | POA: Insufficient documentation

## 2019-11-10 DIAGNOSIS — I480 Paroxysmal atrial fibrillation: Secondary | ICD-10-CM | POA: Diagnosis present

## 2019-11-10 DIAGNOSIS — Z6841 Body Mass Index (BMI) 40.0 and over, adult: Secondary | ICD-10-CM | POA: Insufficient documentation

## 2019-11-10 DIAGNOSIS — Z79899 Other long term (current) drug therapy: Secondary | ICD-10-CM | POA: Diagnosis not present

## 2019-11-10 DIAGNOSIS — I4892 Unspecified atrial flutter: Secondary | ICD-10-CM | POA: Insufficient documentation

## 2019-11-10 DIAGNOSIS — F17210 Nicotine dependence, cigarettes, uncomplicated: Secondary | ICD-10-CM | POA: Insufficient documentation

## 2019-11-10 DIAGNOSIS — Z7901 Long term (current) use of anticoagulants: Secondary | ICD-10-CM | POA: Insufficient documentation

## 2019-11-10 MED ORDER — MULTAQ 400 MG PO TABS: 400.0000 mg | ORAL_TABLET | Freq: Two times a day (BID) | ORAL | 3 refills | Status: DC

## 2019-11-10 MED ORDER — METOPROLOL SUCCINATE ER 100 MG PO TB24: 100.0000 mg | ORAL_TABLET | Freq: Every day | ORAL | 1 refills | Status: DC

## 2019-11-10 NOTE — Patient Instructions (Signed)
Increase metoprolol to 100mg  once a day  Start Multaq 400mg  twice a day WITH FOOD

## 2019-11-10 NOTE — Progress Notes (Signed)
Primary Care Physician: Ardith Dark, MD Primary Cardiologist: Dr Delton See Primary Electrophysiologist: none Referring Physician: [name]   Emily Maldonado is a 44 y.o. female with a history of hypothyroidism, tobacco abuse, HTN, OSA on CPAP, obesity, anxiety and paroxysmal fibrillation and atrial flutter who presents for follow up in the [facility]. Patient was diagnosed with afib on 11/16/18 after presenting to the ER with palpitations. ECG showed afib with RVR and she converted to SR with diltiazem. Patient has a CHADS2VASC score of 2. Patient woke with symptoms of heart racing on 09/20/19 and presented to the ER in rapid afib. She did take an extra dose of BB. She converted to SR in the ER without intervention. There were no triggers that she could identify. She denies alcohol use and is compliant with her CPAP.  On follow up today, patient began having palpitations and chest pain 11/07/19 while she was visiting family in [place]. She went to the ER in [facility] but left without definitive treatment. She presented to [facility] ER on 11/08/19 in atrial flutter with a heart rate of 170 bpm. Patient admits she stopped her flecainide on her own 2/2 side effects. Her magnesium was low at 1.6. She converted to SR during triage. She had one alcoholic drink prior to onset of symptoms.   Today, she denies symptoms of chest pain, shortness of breath, orthopnea, PND, lower extremity edema, dizziness, presyncope, syncope, snoring, daytime somnolence, bleeding, or neurologic sequela. The patient is tolerating medications without difficulties and is otherwise without complaint today.    Atrial Fibrillation Risk Factors:  she does have symptoms or diagnosis of sleep apnea. she is compliant with CPAP therapy. she does not have a history of rheumatic fever. she does not have a history of alcohol use. The patient does have a history of early familial atrial fibrillation or  other arrhythmias. Most of her immediate family members have afib.   she has a BMI of Body mass index is 44.07 kg/m.[name] Filed Weights   11/10/19 1035  Weight: 120.1 kg    Family History  Problem Relation Age of Onset  . Breast cancer Mother 78  . Heart failure Mother   . Atrial fibrillation Mother   . Cervical cancer Mother   . Diabetes Mother   . Hypertension Mother   . Heart failure Father   . Hypertension Father   . Heart failure Maternal Grandmother   . Atrial fibrillation Maternal Grandmother   . Stroke Maternal Grandmother   . Uterine cancer Maternal Grandmother   . Diabetes Brother   . Hypertension Brother   . Uterine cancer Maternal Aunt      Atrial Fibrillation Management history:  Previous antiarrhythmic drugs: flecainide Previous cardioversions: none Previous ablations: none CHADS2VASC score: 2 Anticoagulation history: Eliquis   Past Medical History:  Diagnosis Date  . A-fib (HCC)   . Abnormal Pap smear of cervix   . Anemia    b12  . Anxiety   . B12 deficiency anemia   . Childhood asthma   . Former tobacco use   . Hypertension   . Hypothyroidism   . Migraines    aura  . Morbid obesity (HCC)   . Ovarian cyst   . Sleep apnea    Use C-pap    Past Surgical History:  Procedure Laterality Date  . BREAST BIOPSY Left 2014  . BREAST EXCISIONAL BIOPSY Right   . CERVICAL BIOPSY  W/ LOOP ELECTRODE EXCISION    .  LEFT HEART CATH AND CORONARY ANGIOGRAPHY N/A 12/04/2017   Procedure: LEFT HEART CATH AND CORONARY ANGIOGRAPHY;  Surgeon: Burnell Blanks, MD;  Location: [facility];  Service: Cardiovascular;  Laterality: N/A;  . TONSILLECTOMY    . TUBAL LIGATION      Current Outpatient Medications  Medication Sig Dispense Refill  . aspirin 325 MG tablet Take 325 mg by mouth daily.    . cyanocobalamin (,VITAMIN B-12,) 1000 MCG/ML injection Inject 1 mL (1,000 mcg total) into the muscle every 14 (fourteen) days. 1029mcg IM Daily x 6 days, then  1035mcg IM weekly x 1 month, then 1036mcg monthly (Patient taking differently: Inject 1,000 mcg into the muscle every 14 (fourteen) days. ) 30 mL 0  . ferrous sulfate 325 (65 FE) MG tablet Take 325 mg by mouth every other day.    . levothyroxine (SYNTHROID) 112 MCG tablet TAKE 1 TABLET EVERY DAY ON AN EMPTY STOMACH (Patient taking differently: Take 112 mcg by mouth daily before breakfast. ) 30 tablet 1  . magnesium oxide (MAG-OX) 400 MG tablet Take 400 mg by mouth daily.    . medroxyPROGESTERone (DEPO-PROVERA) 150 MG/ML injection Inject 1 mL (150 mg total) into the muscle once for 1 dose. (Patient taking differently: Inject 150 mg into the muscle every 3 (three) months. ) 1 mL 2  . metoprolol succinate (TOPROL-XL) 100 MG 24 hr tablet Take 1 tablet (100 mg total) by mouth daily. Take with or immediately following a meal. 90 tablet 1  . metoprolol tartrate (LOPRESSOR) 25 MG tablet Take 1 tablet every 6 hours as needed for HR >100 60 tablet 11  . pantoprazole (PROTONIX) 40 MG tablet Take 1 tablet (40 mg total) by mouth daily. 90 tablet 3  . Potassium 99 MG TABS Take 99 mg by mouth every other day.    . SYRINGE-NEEDLE, DISP, 3 ML 22G X 1-1/2" 3 ML MISC Use daily as directed. 50 each 0  . varenicline (CHANTIX) 1 MG tablet Take 1 mg by mouth 2 (two) times daily.    [name] VITAMIN D PO Take 10,000 Units by mouth daily.     [name] dronedarone (MULTAQ) 400 MG tablet Take 1 tablet (400 mg total) by mouth 2 (two) times daily with a meal. 60 tablet 3   No current facility-administered medications for this encounter.    Allergies  Allergen Reactions  . Penicillins Anaphylaxis    Has patient had a PCN reaction causing immediate rash, facial/tongue/throat swelling, SOB or lightheadedness with hypotension: /No Has patient had a PCN reaction causing severe rash involving mucus membranes or skin necrosis: No Has patient had a PCN reaction that required hospitalization: No Has patient had a PCN reaction occurring within  the last 10 years: No If all of the above answers are "NO", then may proceed with Cephalosporin use.   . Shellfish Allergy Anaphylaxis  . Zithromax [Azithromycin] Rash    Social History   Socioeconomic History  . Marital status: Single    Spouse name: Not on file  . Number of children: Not on file  . Years of education: Not on file  . Highest education level: Not on file  Occupational History  . Not on file  Tobacco Use  . Smoking status: Current Every Day Smoker    Packs/day: 1.00    Types: Cigarettes  . Smokeless tobacco: Never Used  . Tobacco comment: 3/4 of pack  Substance and Sexual Activity  . Alcohol use: Not Currently    Alcohol/week: 1.0  standard drinks    Types: 1 Standard drinks or equivalent per week  . Drug use: No  . Sexual activity: Yes    Partners: Male    Birth control/protection: Surgical    Comment: btl  Other Topics Concern  . Not on file  Social History Narrative  . Not on file   Social Determinants of Health   Financial Resource Strain:   . Difficulty of Paying Living Expenses:   Food Insecurity:   . Worried About Programme researcher, broadcasting/film/video in the Last Year:   . Barista in the Last Year:   Transportation Needs:   . Freight forwarder (Medical):   [name] Lack of Transportation (Non-Medical):   Physical Activity:   . Days of Exercise per Week:   . Minutes of Exercise per Session:   Stress:   . Feeling of Stress :   Social Connections:   . Frequency of Communication with Friends and Family:   . Frequency of Social Gatherings with Friends and Family:   . Attends Religious Services:   . Active Member of Clubs or Organizations:   . Attends Banker Meetings:   [name] Marital Status:   Intimate Partner Violence:   . Fear of Current or Ex-Partner:   . Emotionally Abused:   [name] Physically Abused:   . Sexually Abused:      ROS- All systems are reviewed and negative except as per the HPI above.  Physical Exam: Vitals:   11/10/19  1035  BP: (!) 150/88  Pulse: 88  Weight: 120.1 kg  Height: 5\' 5"  (1.651 m)    GEN- The patient is well appearing obese female, alert and oriented x 3 today.   HEENT-head normocephalic, atraumatic, sclera clear, conjunctiva pink, hearing intact, trachea midline. Lungs- Clear to ausculation bilaterally, normal work of breathing Heart- Regular rate and rhythm, no murmurs, rubs or gallops  GI- soft, NT, ND, + BS Extremities- no clubbing, cyanosis, or edema MS- no significant deformity or atrophy Skin- no rash or lesion Psych- euthymic mood, full affect Neuro- strength and sensation are intact   Wt Readings from Last 3 Encounters:  11/10/19 120.1 kg  11/08/19 130 kg  09/28/19 119.1 kg    EKG today demonstrates SR HR 88, PR 140, QRS 86, QTc 476  Echo 12/05/17 demonstrated  - Left ventricle: The cavity size was normal. Systolic function was normal. The estimated ejection fraction was in the range of 55% to 60%. Wall motion was normal; there were no regional wall motion abnormalities. Left ventricular diastolic function parameters were normal. - Left atrium: The atrium was moderately dilated. - Atrial septum: No defect or patent foramen ovale was identified.  LA 49mm  LHC 12/04/17 1. No angiographic evidence of CAD 2. Mild elevation LV filling pressures 3. Non-cardiac chest pain  Epic records are reviewed at length today  CHA2DS2-VASc Score = 2 The patient's score is based upon: CHF History: No HTN History: Yes Age : < 65 Diabetes History: No Stroke History: No Vascular Disease History: No Gender: Female   ASSESSMENT AND PLAN: 1. Paroxysmal Atrial Fibrillation/atrial flutter The patient's CHA2DS2-VASc score is 2, indicating a 2.2% annual risk of stroke. Patient stopped flecainide on her own 2/2 side effects. She had two more episodes of afib and flutter since stopping flecainide. Will plan to start Multaq 400 mg BID. Recheck ECG in one week.  She has a  diagnosis of adenomyosis and had uterine bleeding on Eliquis previously. Given her CHADS2VASC  score of 2 and bleeding risk, will not resume Eliquis at this time.  Increase Toprol to 100 mg daily. Continue Lopressor 25 mg PRN q 6hours for heart racing.  2. HTN Elevated today, med changes as above.  3. Obesity Body mass index is 44.07 kg/m. Lifestyle modification was discussed and encouraged including regular physical activity and weight reduction.  4. Obstructive sleep apnea Patient reports compliance with CPAP therapy.   Follow up in one week for ECG.   Jorja Loa PA-C [facility] [address] [place], [place] [address] [phone] 11/10/2019 11:04 AM

## 2019-11-16 ENCOUNTER — Ambulatory Visit (HOSPITAL_COMMUNITY)
Admission: RE | Admit: 2019-11-16 | Discharge: 2019-11-16 | Disposition: A | Discharge status: Home / Self Care | Payer: Commercial Managed Care - PPO | Source: Ambulatory Visit | Attending: Physician Assistant | Admitting: Physician Assistant

## 2019-11-16 ENCOUNTER — Other Ambulatory Visit: Payer: Self-pay

## 2019-11-16 ENCOUNTER — Encounter (HOSPITAL_COMMUNITY): Payer: Self-pay | Admitting: Physician Assistant

## 2019-11-16 VITALS — BP 132/88 | HR 71 | Ht 65.0 in | Wt 266.2 lb

## 2019-11-16 DIAGNOSIS — Z79890 Hormone replacement therapy (postmenopausal): Secondary | ICD-10-CM | POA: Diagnosis not present

## 2019-11-16 DIAGNOSIS — I4892 Unspecified atrial flutter: Secondary | ICD-10-CM | POA: Insufficient documentation

## 2019-11-16 DIAGNOSIS — F419 Anxiety disorder, unspecified: Secondary | ICD-10-CM | POA: Insufficient documentation

## 2019-11-16 DIAGNOSIS — Z7982 Long term (current) use of aspirin: Secondary | ICD-10-CM | POA: Diagnosis not present

## 2019-11-16 DIAGNOSIS — Z6841 Body Mass Index (BMI) 40.0 and over, adult: Secondary | ICD-10-CM | POA: Insufficient documentation

## 2019-11-16 DIAGNOSIS — E669 Obesity, unspecified: Secondary | ICD-10-CM | POA: Diagnosis not present

## 2019-11-16 DIAGNOSIS — I480 Paroxysmal atrial fibrillation: Secondary | ICD-10-CM | POA: Diagnosis present

## 2019-11-16 DIAGNOSIS — I10 Essential (primary) hypertension: Secondary | ICD-10-CM | POA: Diagnosis not present

## 2019-11-16 DIAGNOSIS — E039 Hypothyroidism, unspecified: Secondary | ICD-10-CM | POA: Diagnosis not present

## 2019-11-16 DIAGNOSIS — G4733 Obstructive sleep apnea (adult) (pediatric): Secondary | ICD-10-CM | POA: Diagnosis not present

## 2019-11-16 DIAGNOSIS — Z79899 Other long term (current) drug therapy: Secondary | ICD-10-CM | POA: Insufficient documentation

## 2019-11-16 DIAGNOSIS — Z7901 Long term (current) use of anticoagulants: Secondary | ICD-10-CM | POA: Insufficient documentation

## 2019-11-16 DIAGNOSIS — Z881 Allergy status to other antibiotic agents status: Secondary | ICD-10-CM | POA: Insufficient documentation

## 2019-11-16 DIAGNOSIS — F17210 Nicotine dependence, cigarettes, uncomplicated: Secondary | ICD-10-CM | POA: Insufficient documentation

## 2019-11-16 DIAGNOSIS — Z8249 Family history of ischemic heart disease and other diseases of the circulatory system: Secondary | ICD-10-CM | POA: Diagnosis not present

## 2019-11-16 DIAGNOSIS — Z9989 Dependence on other enabling machines and devices: Secondary | ICD-10-CM | POA: Diagnosis not present

## 2019-11-16 DIAGNOSIS — Z880 Allergy status to penicillin: Secondary | ICD-10-CM | POA: Insufficient documentation

## 2019-11-16 NOTE — Progress Notes (Signed)
Primary Care Physician: Ardith Dark, MD Primary Cardiologist: Dr Delton See Primary Electrophysiologist: none Referring Physician: [name]   Emily Maldonado is a 44 y.o. female with a history of hypothyroidism, tobacco abuse, HTN, OSA on CPAP, obesity, anxiety and paroxysmal fibrillation and atrial flutter who presents for follow up in the [facility]. Patient was diagnosed with afib on 11/16/18 after presenting to the ER with palpitations. ECG showed afib with RVR and she converted to SR with diltiazem. Patient has a CHADS2VASC score of 2. Patient woke with symptoms of heart racing on 09/20/19 and presented to the ER in rapid afib. She did take an extra dose of BB. She converted to SR in the ER without intervention. There were no triggers that she could identify. She denies alcohol use and is compliant with her CPAP. Patient began having palpitations and chest pain 11/07/19 while she was visiting family in [place]. She went to the ER in [facility] but left without definitive treatment. She presented to [facility] ER on 11/08/19 in atrial flutter with a heart rate of 170 bpm. Patient admits she stopped her flecainide on her own 2/2 side effects. Her magnesium was low at 1.6. She converted to SR during triage. She had one alcoholic drink prior to onset of symptoms.   On follow up today, patient reports that she has done well since her last visit. She is tolerating the Multaq without difficulty as long as she takes it with food. She has had no symptoms of afib.   Today, she denies symptoms of palpitations, chest pain, shortness of breath, orthopnea, PND, lower extremity edema, dizziness, presyncope, syncope, snoring, daytime somnolence, bleeding, or neurologic sequela. The patient is tolerating medications without difficulties and is otherwise without complaint today.    Atrial Fibrillation Risk Factors:  she does have symptoms or diagnosis of sleep apnea. she is compliant  with CPAP therapy. she does not have a history of rheumatic fever. she does not have a history of alcohol use. The patient does have a history of early familial atrial fibrillation or other arrhythmias. Most of her immediate family members have afib.   she has a BMI of Body mass index is 44.3 kg/m.[name] Filed Weights   11/16/19 1006  Weight: 120.7 kg    Family History  Problem Relation Age of Onset  . Breast cancer Mother 70  . Heart failure Mother   . Atrial fibrillation Mother   . Cervical cancer Mother   . Diabetes Mother   . Hypertension Mother   . Heart failure Father   . Hypertension Father   . Heart failure Maternal Grandmother   . Atrial fibrillation Maternal Grandmother   . Stroke Maternal Grandmother   . Uterine cancer Maternal Grandmother   . Diabetes Brother   . Hypertension Brother   . Uterine cancer Maternal Aunt      Atrial Fibrillation Management history:  Previous antiarrhythmic drugs: flecainide, Multaq Previous cardioversions: none Previous ablations: none CHADS2VASC score: 2 Anticoagulation history: Eliquis   Past Medical History:  Diagnosis Date  . A-fib (HCC)   . Abnormal Pap smear of cervix   . Anemia    b12  . Anxiety   . B12 deficiency anemia   . Childhood asthma   . Former tobacco use   . Hypertension   . Hypothyroidism   . Migraines    aura  . Morbid obesity (HCC)   . Ovarian cyst   . Sleep apnea  Use C-pap    Past Surgical History:  Procedure Laterality Date  . BREAST BIOPSY Left 2014  . BREAST EXCISIONAL BIOPSY Right   . CERVICAL BIOPSY  W/ LOOP ELECTRODE EXCISION    . LEFT HEART CATH AND CORONARY ANGIOGRAPHY N/A 12/04/2017   Procedure: LEFT HEART CATH AND CORONARY ANGIOGRAPHY;  Surgeon: Kathleene Hazel, MD;  Location: [facility];  Service: Cardiovascular;  Laterality: N/A;  . TONSILLECTOMY    . TUBAL LIGATION      Current Outpatient Medications  Medication Sig Dispense Refill  . aspirin 325 MG tablet  Take 325 mg by mouth daily.    . cyanocobalamin (,VITAMIN B-12,) 1000 MCG/ML injection Inject 1 mL (1,000 mcg total) into the muscle every 14 (fourteen) days. IM Daily x 6 days, then IM weekly x 1 month, then monthly (Patient taking differently: Inject 1,000 mcg into the muscle every 14 (fourteen) days. ) 30 mL 0  . dronedarone (MULTAQ) 400 MG tablet Take 1 tablet (400 mg total) by mouth 2 (two) times daily with a meal. 60 tablet 3  . ferrous sulfate 325 (65 FE) MG tablet Take 325 mg by mouth every other day.    . levothyroxine (SYNTHROID) 112 MCG tablet TAKE 1 TABLET EVERY DAY ON AN EMPTY STOMACH (Patient taking differently: Take 112 mcg by mouth daily before breakfast. ) 30 tablet 1  . magnesium oxide (MAG-OX) 400 MG tablet Take 400 mg by mouth daily.    . medroxyPROGESTERone (DEPO-PROVERA) 150 MG/ML injection Inject 1 mL (150 mg total) into the muscle once for 1 dose. (Patient taking differently: Inject 150 mg into the muscle every 3 (three) months. ) 1 mL 2  . metoprolol succinate (TOPROL-XL) 100 MG 24 hr tablet Take 1 tablet (100 mg total) by mouth daily. Take with or immediately following a meal. 90 tablet 1  . metoprolol tartrate (LOPRESSOR) 25 MG tablet Take 1 tablet every 6 hours as needed for HR >100 60 tablet 11  . pantoprazole (PROTONIX) 40 MG tablet Take 1 tablet (40 mg total) by mouth daily. 90 tablet 3  . Potassium 99 MG TABS Take 99 mg by mouth every other day.    . SYRINGE-NEEDLE, DISP, 3 ML 22G X 1-1/2" 3 ML MISC Use daily as directed. 50 each 0  . varenicline (CHANTIX) 1 MG tablet Take 1 mg by mouth 2 (two) times daily.    [name] VITAMIN D PO Take 10,000 Units by mouth daily.      No current facility-administered medications for this encounter.    Allergies  Allergen Reactions  . Penicillins Anaphylaxis    Has patient had a PCN reaction causing immediate rash, facial/tongue/throat swelling, SOB or lightheadedness with hypotension: /No Has patient had a PCN  reaction causing severe rash involving mucus membranes or skin necrosis: No Has patient had a PCN reaction that required hospitalization: No Has patient had a PCN reaction occurring within the last 10 years: No If all of the above answers are "NO", then may proceed with Cephalosporin use.   . Shellfish Allergy Anaphylaxis  . Zithromax [Azithromycin] Rash    Social History   Socioeconomic History  . Marital status: Single    Spouse name: Not on file  . Number of children: Not on file  . Years of education: Not on file  . Highest education level: Not on file  Occupational History  . Not on file  Tobacco Use  . Smoking status: Current Every Day Smoker  Packs/day: 1.00    Types: Cigarettes  . Smokeless tobacco: Never Used  . Tobacco comment: 3/4 of pack  Substance and Sexual Activity  . Alcohol use: Not Currently    Alcohol/week: 1.0 standard drinks    Types: 1 Standard drinks or equivalent per week  . Drug use: No  . Sexual activity: Yes    Partners: Male    Birth control/protection: Surgical    Comment: btl  Other Topics Concern  . Not on file  Social History Narrative  . Not on file   Social Determinants of Health   Financial Resource Strain:   . Difficulty of Paying Living Expenses:   Food Insecurity:   . Worried About Programme researcher, broadcasting/film/video in the Last Year:   . Barista in the Last Year:   Transportation Needs:   . Freight forwarder (Medical):   [name] Lack of Transportation (Non-Medical):   Physical Activity:   . Days of Exercise per Week:   . Minutes of Exercise per Session:   Stress:   . Feeling of Stress :   Social Connections:   . Frequency of Communication with Friends and Family:   . Frequency of Social Gatherings with Friends and Family:   . Attends Religious Services:   . Active Member of Clubs or Organizations:   . Attends Banker Meetings:   [name] Marital Status:   Intimate Partner Violence:   . Fear of Current or Ex-Partner:     . Emotionally Abused:   [name] Physically Abused:   . Sexually Abused:      ROS- All systems are reviewed and negative except as per the HPI above.  Physical Exam: Vitals:   11/16/19 1006  BP: 132/88  Pulse: 71  Weight: 120.7 kg  Height: 5\' 5"  (1.651 m)    GEN- The patient is well appearing obese female, alert and oriented x 3 today.   HEENT-head normocephalic, atraumatic, sclera clear, conjunctiva pink, hearing intact, trachea midline. Lungs- Clear to ausculation bilaterally, normal work of breathing Heart- Regular rate and rhythm, no murmurs, rubs or gallops  GI- soft, NT, ND, + BS Extremities- no clubbing, cyanosis, or edema MS- no significant deformity or atrophy Skin- no rash or lesion Psych- euthymic mood, full affect Neuro- strength and sensation are intact   Wt Readings from Last 3 Encounters:  11/16/19 120.7 kg  11/10/19 120.1 kg  11/08/19 130 kg    EKG today demonstrates SR HR 71, NST, PR 138, QRS 86, QTc 452  Echo 12/05/17 demonstrated  - Left ventricle: The cavity size was normal. Systolic function was normal. The estimated ejection fraction was in the range of 55% to 60%. Wall motion was normal; there were no regional wall motion abnormalities. Left ventricular diastolic function parameters were normal. - Left atrium: The atrium was moderately dilated. - Atrial septum: No defect or patent foramen ovale was identified.  LA 74mm  LHC 12/04/17 1. No angiographic evidence of CAD 2. Mild elevation LV filling pressures 3. Non-cardiac chest pain  Epic records are reviewed at length today  CHA2DS2-VASc Score = 2 The patient's score is based upon: CHF History: No HTN History: Yes Age : < 65 Diabetes History: No Stroke History: No Vascular Disease History: No Gender: Female   ASSESSMENT AND PLAN: 1. Paroxysmal Atrial Fibrillation/atrial flutter The patient's CHA2DS2-VASc score is 2, indicating a 2.2% annual risk of stroke. Patient appears to  be maintaining SR. Continue Multaq 400 mg BID. She has a  diagnosis of adenomyosis and had uterine bleeding on Eliquis previously. Given her CHADS2VASC score of 2 and bleeding risk, will not resume Eliquis at this time.  Continue Toprol to 100 mg daily. Continue Lopressor 25 mg PRN q 6hours for heart racing.  2. HTN Stable, no changes today.  3. Obesity Body mass index is 44.3 kg/m. Lifestyle modification was discussed and encouraged including regular physical activity and weight reduction.  4. Obstructive sleep apnea Patient reports compliance with CPAP therapy.   Follow up in the AF clinic in 3 months.    [facility] [address] [place], [place] [address] [phone] 11/16/2019 10:22 AM

## 2019-11-17 ENCOUNTER — Ambulatory Visit (HOSPITAL_COMMUNITY): Payer: Commercial Managed Care - PPO | Admitting: Physician Assistant

## 2019-11-20 ENCOUNTER — Other Ambulatory Visit: Payer: Self-pay | Admitting: Family Medicine

## 2019-11-23 ENCOUNTER — Telehealth: Payer: Self-pay

## 2019-11-23 NOTE — Telephone Encounter (Signed)
[facility] fax over  -Missing/illegible Information. Placed in folder

## 2019-11-24 ENCOUNTER — Other Ambulatory Visit: Payer: Self-pay | Admitting: *Deleted

## 2019-11-24 ENCOUNTER — Other Ambulatory Visit: Payer: Self-pay

## 2019-11-24 ENCOUNTER — Other Ambulatory Visit (INDEPENDENT_AMBULATORY_CARE_PROVIDER_SITE_OTHER): Payer: Commercial Managed Care - PPO

## 2019-11-24 DIAGNOSIS — E039 Hypothyroidism, unspecified: Secondary | ICD-10-CM

## 2019-11-24 LAB — TSH: TSH: 1.4 u[IU]/mL (ref 0.35–4.50)

## 2019-11-24 MED ORDER — LEVOTHYROXINE SODIUM 112 MCG PO TABS: ORAL_TABLET | ORAL | 1 refills | Status: DC

## 2019-11-25 NOTE — Progress Notes (Signed)
Please inform patient of the following:  TSH level is normal.  Leigh Blas M. [name], MD 11/25/2019 9:13 AM

## 2019-12-25 ENCOUNTER — Other Ambulatory Visit: Payer: Self-pay | Admitting: Family Medicine

## 2020-01-04 ENCOUNTER — Ambulatory Visit (HOSPITAL_COMMUNITY): Payer: Commercial Managed Care - PPO | Admitting: Physician Assistant

## 2020-01-15 ENCOUNTER — Other Ambulatory Visit: Payer: Self-pay | Admitting: Family Medicine

## 2020-01-17 ENCOUNTER — Telehealth: Payer: Self-pay | Admitting: Physician Assistant

## 2020-01-17 ENCOUNTER — Other Ambulatory Visit (HOSPITAL_COMMUNITY): Payer: Self-pay | Admitting: Physician Assistant

## 2020-01-17 NOTE — Telephone Encounter (Signed)
FYI :Order will be closed due to patient didn't respond to request to call and schedule.     December 16, 2019   Emily Maldonado [address] [place] [address]   Dear Ms. Denice Maldonado,   Our office, [facility], has made several attempts to contact you in order to schedule the EXERCISE TOLERANCE TEST that Alphonzo Severance, PA  has ordered.  Please call us [phone] to schedule this test.  If we have had no response in 10 days, your order will be canceled, and your physician will be notified.  We look forward to hearing from you and participating in your health care needs.  Sincerely,   [facility] Scheduling Team

## 2020-01-17 NOTE — Telephone Encounter (Signed)
Pt no longer on flecainide. ETT not needed at this time.

## 2020-02-02 ENCOUNTER — Other Ambulatory Visit (HOSPITAL_COMMUNITY): Payer: Self-pay | Admitting: Physician Assistant

## 2020-03-08 ENCOUNTER — Encounter (HOSPITAL_COMMUNITY): Payer: Self-pay | Admitting: Physician Assistant

## 2020-03-08 ENCOUNTER — Other Ambulatory Visit: Payer: Self-pay

## 2020-03-08 ENCOUNTER — Emergency Department (HOSPITAL_COMMUNITY)
Admission: EM | Admit: 2020-03-08 | Discharge: 2020-03-08 | Disposition: A | Discharge status: Home / Self Care | Payer: Commercial Managed Care - PPO | Attending: Emergency Medicine | Admitting: Emergency Medicine

## 2020-03-08 ENCOUNTER — Ambulatory Visit (HOSPITAL_BASED_OUTPATIENT_CLINIC_OR_DEPARTMENT_OTHER)
Admission: RE | Admit: 2020-03-08 | Discharge: 2020-03-08 | Disposition: A | Discharge status: Home / Self Care | Payer: Commercial Managed Care - PPO | Source: Ambulatory Visit | Attending: Physician Assistant | Admitting: Physician Assistant

## 2020-03-08 ENCOUNTER — Emergency Department (HOSPITAL_COMMUNITY): Payer: Commercial Managed Care - PPO

## 2020-03-08 VITALS — BP 110/80 | HR 148 | Ht 65.0 in | Wt 277.4 lb

## 2020-03-08 DIAGNOSIS — F419 Anxiety disorder, unspecified: Secondary | ICD-10-CM | POA: Insufficient documentation

## 2020-03-08 DIAGNOSIS — I10 Essential (primary) hypertension: Secondary | ICD-10-CM | POA: Insufficient documentation

## 2020-03-08 DIAGNOSIS — Z7901 Long term (current) use of anticoagulants: Secondary | ICD-10-CM | POA: Insufficient documentation

## 2020-03-08 DIAGNOSIS — F17210 Nicotine dependence, cigarettes, uncomplicated: Secondary | ICD-10-CM | POA: Insufficient documentation

## 2020-03-08 DIAGNOSIS — Z7982 Long term (current) use of aspirin: Secondary | ICD-10-CM | POA: Insufficient documentation

## 2020-03-08 DIAGNOSIS — Z8249 Family history of ischemic heart disease and other diseases of the circulatory system: Secondary | ICD-10-CM | POA: Insufficient documentation

## 2020-03-08 DIAGNOSIS — I4892 Unspecified atrial flutter: Secondary | ICD-10-CM

## 2020-03-08 DIAGNOSIS — E039 Hypothyroidism, unspecified: Secondary | ICD-10-CM | POA: Insufficient documentation

## 2020-03-08 DIAGNOSIS — Z79899 Other long term (current) drug therapy: Secondary | ICD-10-CM | POA: Diagnosis not present

## 2020-03-08 DIAGNOSIS — G4733 Obstructive sleep apnea (adult) (pediatric): Secondary | ICD-10-CM | POA: Diagnosis not present

## 2020-03-08 DIAGNOSIS — E669 Obesity, unspecified: Secondary | ICD-10-CM | POA: Diagnosis not present

## 2020-03-08 DIAGNOSIS — Z793 Long term (current) use of hormonal contraceptives: Secondary | ICD-10-CM | POA: Insufficient documentation

## 2020-03-08 DIAGNOSIS — I480 Paroxysmal atrial fibrillation: Secondary | ICD-10-CM | POA: Insufficient documentation

## 2020-03-08 DIAGNOSIS — R002 Palpitations: Secondary | ICD-10-CM | POA: Diagnosis present

## 2020-03-08 DIAGNOSIS — Z881 Allergy status to other antibiotic agents status: Secondary | ICD-10-CM | POA: Insufficient documentation

## 2020-03-08 DIAGNOSIS — Z79890 Hormone replacement therapy (postmenopausal): Secondary | ICD-10-CM | POA: Insufficient documentation

## 2020-03-08 DIAGNOSIS — Z6841 Body Mass Index (BMI) 40.0 and over, adult: Secondary | ICD-10-CM | POA: Insufficient documentation

## 2020-03-08 DIAGNOSIS — Z9989 Dependence on other enabling machines and devices: Secondary | ICD-10-CM | POA: Insufficient documentation

## 2020-03-08 DIAGNOSIS — I483 Typical atrial flutter: Secondary | ICD-10-CM | POA: Insufficient documentation

## 2020-03-08 DIAGNOSIS — R079 Chest pain, unspecified: Secondary | ICD-10-CM | POA: Diagnosis not present

## 2020-03-08 DIAGNOSIS — Z880 Allergy status to penicillin: Secondary | ICD-10-CM | POA: Diagnosis not present

## 2020-03-08 LAB — CBC WITH DIFFERENTIAL/PLATELET
Abs Immature Granulocytes: 0.05 10*3/uL (ref 0.00–0.07)
Basophils Absolute: 0.1 10*3/uL (ref 0.0–0.1)
Basophils Relative: 1 %
Eosinophils Absolute: 0.2 10*3/uL (ref 0.0–0.5)
Eosinophils Relative: 2 %
HCT: 52.1 % — ABNORMAL HIGH (ref 36.0–46.0)
Hemoglobin: 16.6 g/dL — ABNORMAL HIGH (ref 12.0–15.0)
Immature Granulocytes: 0 %
Lymphocytes Relative: 35 %
Lymphs Abs: 4.2 10*3/uL — ABNORMAL HIGH (ref 0.7–4.0)
MCH: 27.5 pg (ref 26.0–34.0)
MCHC: 31.9 g/dL (ref 30.0–36.0)
MCV: 86.4 fL (ref 80.0–100.0)
Monocytes Absolute: 1 10*3/uL (ref 0.1–1.0)
Monocytes Relative: 8 %
Neutro Abs: 6.7 10*3/uL (ref 1.7–7.7)
Neutrophils Relative %: 54 %
Platelets: 301 10*3/uL (ref 150–400)
RBC: 6.03 MIL/uL — ABNORMAL HIGH (ref 3.87–5.11)
RDW: 14 % (ref 11.5–15.5)
WBC: 12.2 10*3/uL — ABNORMAL HIGH (ref 4.0–10.5)
nRBC: 0 % (ref 0.0–0.2)

## 2020-03-08 LAB — BASIC METABOLIC PANEL
Anion gap: 9 (ref 5–15)
BUN: 14 mg/dL (ref 6–20)
CO2: 22 mmol/L (ref 22–32)
Calcium: 9.3 mg/dL (ref 8.9–10.3)
Chloride: 110 mmol/L (ref 98–111)
Creatinine, Ser: 0.91 mg/dL (ref 0.44–1.00)
GFR calc Af Amer: >60 mL/min (ref >60)
GFR calc non Af Amer: >60 mL/min (ref >60)
Glucose, Bld: 118 mg/dL — ABNORMAL HIGH (ref 70–99)
Potassium: 4.1 mmol/L (ref 3.5–5.1)
Sodium: 141 mmol/L (ref 135–145)

## 2020-03-08 LAB — MAGNESIUM: Magnesium: 1.8 mg/dL (ref 1.7–2.4)

## 2020-03-08 MED ORDER — PROPOFOL 10 MG/ML IV BOLUS
INTRAVENOUS | Status: AC
  Filled 2020-03-08: qty 20

## 2020-03-08 MED ORDER — DILTIAZEM HCL 25 MG/5ML IV SOLN
10.0000 mg | Freq: Once | INTRAVENOUS | Status: AC
  Administered 2020-03-08: 10 mg via INTRAVENOUS
  Filled 2020-03-08: qty 5

## 2020-03-08 MED ORDER — PROPOFOL 10 MG/ML IV BOLUS
1.0000 mg/kg | Freq: Once | INTRAVENOUS | Status: AC
  Administered 2020-03-08: 80 mg via INTRAVENOUS
  Filled 2020-03-08: qty 20

## 2020-03-08 MED ORDER — DILTIAZEM HCL 25 MG/5ML IV SOLN: 20.0000 mg | Freq: Once | INTRAVENOUS | Status: DC

## 2020-03-08 MED ORDER — SODIUM CHLORIDE 0.9 % IV BOLUS
1000.0000 mL | Freq: Once | INTRAVENOUS | Status: AC
  Administered 2020-03-08: 1000 mL via INTRAVENOUS

## 2020-03-08 MED ORDER — APIXABAN 5 MG PO TABS: 5.0000 mg | ORAL_TABLET | Freq: Two times a day (BID) | ORAL | 0 refills | Status: DC

## 2020-03-08 NOTE — Sedation Documentation (Signed)
200 J, synchonized cardioversion, shock delivered.

## 2020-03-08 NOTE — Discharge Instructions (Signed)
Stop taking the aspirin while you are taking eliquis. Do not take Ibuprofen/Advil/Aleve/Motrin/Goody Powders/Naproxen/BC powders/Meloxicam/Diclofenac/Indomethacin and other Nonsteroidal anti-inflammatory medications

## 2020-03-08 NOTE — Progress Notes (Signed)
Primary Care Physician: Ardith Dark, MD Primary Cardiologist: Dr Delton See Primary Electrophysiologist: none Referring Physician: [name]   Emily Maldonado is a 44 y.o. female with a history of hypothyroidism, tobacco abuse, HTN, OSA on CPAP, obesity, anxiety and paroxysmal fibrillation and atrial flutter who presents for follow up in the [facility]. Patient was diagnosed with afib on 11/16/18 after presenting to the ER with palpitations. ECG showed afib with RVR and she converted to SR with diltiazem. Patient has a CHADS2VASC score of 2. Patient woke with symptoms of heart racing on 09/20/19 and presented to the ER in rapid afib. She did take an extra dose of BB. She converted to SR in the ER without intervention. There were no triggers that she could identify. She denies alcohol use and is compliant with her CPAP. Patient began having palpitations and chest pain 11/07/19 while she was visiting family in [place]. She went to the ER in [facility] but left without definitive treatment. She presented to [facility] ER on 11/08/19 in atrial flutter with a heart rate of 170 bpm. Patient admits she stopped her flecainide on her own 2/2 side effects. Her magnesium was low at 1.6. She converted to SR during triage. She had one alcoholic drink prior to onset of symptoms.   On follow up today, patient reports that at 6 pm last night she had a sudden onset of rapid heart rates associated with SOB and chest discomfort which have been typical of her arrhythmias in the past. She took PRN Lopressor with no improvement in her symptoms. In our clinic today she appears to be in typical atrial flutter with rapid rates. There were no triggers that she could identify.   Today, she denies symptoms of orthopnea, PND, lower extremity edema, dizziness, presyncope, syncope, bleeding, or neurologic sequela. The patient is tolerating medications without difficulties and is otherwise without complaint  today.    Atrial Fibrillation Risk Factors:  she does have symptoms or diagnosis of sleep apnea. she is compliant with CPAP therapy. she does not have a history of rheumatic fever. she does not have a history of alcohol use. The patient does have a history of early familial atrial fibrillation or other arrhythmias. Most of her immediate family members have afib.   she has a BMI of Body mass index is 46.16 kg/m.[name] Filed Weights   03/08/20 0903  Weight: 125.8 kg    Family History  Problem Relation Age of Onset  . Breast cancer Mother 9  . Heart failure Mother   . Atrial fibrillation Mother   . Cervical cancer Mother   . Diabetes Mother   . Hypertension Mother   . Heart failure Father   . Hypertension Father   . Heart failure Maternal Grandmother   . Atrial fibrillation Maternal Grandmother   . Stroke Maternal Grandmother   . Uterine cancer Maternal Grandmother   . Diabetes Brother   . Hypertension Brother   . Uterine cancer Maternal Aunt      Atrial Fibrillation Management history:  Previous antiarrhythmic drugs: flecainide, Multaq Previous cardioversions: none Previous ablations: none CHADS2VASC score: 2 Anticoagulation history: Eliquis   Past Medical History:  Diagnosis Date  . A-fib (HCC)   . Abnormal Pap smear of cervix   . Anemia    b12  . Anxiety   . B12 deficiency anemia   . Childhood asthma   . Former tobacco use   . Hypertension   . Hypothyroidism   .  Migraines    aura  . Morbid obesity (HCC)   . Ovarian cyst   . Sleep apnea    Use C-pap    Past Surgical History:  Procedure Laterality Date  . BREAST BIOPSY Left 2014  . BREAST EXCISIONAL BIOPSY Right   . CERVICAL BIOPSY  W/ LOOP ELECTRODE EXCISION    . LEFT HEART CATH AND CORONARY ANGIOGRAPHY N/A 12/04/2017   Procedure: LEFT HEART CATH AND CORONARY ANGIOGRAPHY;  Surgeon: Kathleene Hazel, MD;  Location: [facility];  Service: Cardiovascular;  Laterality: N/A;  . TONSILLECTOMY     . TUBAL LIGATION      Current Outpatient Medications  Medication Sig Dispense Refill  . aspirin 325 MG tablet Take 325 mg by mouth daily.    . CHANTIX 1 MG tablet TAKE 1 TABLET BY MOUTH TWICE A DAY 180 tablet 1  . cyanocobalamin (,VITAMIN B-12,) 1000 MCG/ML injection INJECT 1 ML INTO MUSCLE DAILY FOR 6 DAYS THEN WEEKLY FOR 1 MONTH THEN MONTHLY THEN 1 ML MONTHLY 11 mL 1  . ferrous sulfate 325 (65 FE) MG tablet Take 325 mg by mouth every other day.    . levothyroxine (SYNTHROID) 112 MCG tablet TAKE 1 TABLET EVERY DAY ON AN EMPTY STOMACH 90 tablet 1  . magnesium oxide (MAG-OX) 400 MG tablet Take 400 mg by mouth daily.    . medroxyPROGESTERone (DEPO-PROVERA) 150 MG/ML injection Inject 1 mL (150 mg total) into the muscle once for 1 dose. (Patient taking differently: Inject 150 mg into the muscle every 3 (three) months. ) 1 mL 2  . metoprolol succinate (TOPROL-XL) 100 MG 24 hr tablet Take 1 tablet (100 mg total) by mouth daily. Take with or immediately following a meal. 90 tablet 1  . metoprolol tartrate (LOPRESSOR) 25 MG tablet TAKE 1 TABLET EVERY 6 HOURS AS NEEDED FOR HR >100 360 tablet 1  . MULTAQ 400 MG tablet TAKE 1 TABLET BY MOUTH TWICE A DAY WITH A MEAL 180 tablet 1  . pantoprazole (PROTONIX) 40 MG tablet Take 1 tablet (40 mg total) by mouth daily. 90 tablet 3  . Potassium 99 MG TABS Take 99 mg by mouth every other day.    . SYRINGE-NEEDLE, DISP, 3 ML 22G X 1-1/2" 3 ML MISC Use daily as directed. 50 each 0  . VITAMIN D PO Take 10,000 Units by mouth daily.      No current facility-administered medications for this encounter.    Allergies  Allergen Reactions  . Penicillins Anaphylaxis    Has patient had a PCN reaction causing immediate rash, facial/tongue/throat swelling, SOB or lightheadedness with hypotension: /No Has patient had a PCN reaction causing severe rash involving mucus membranes or skin necrosis: No Has patient had a PCN reaction that required hospitalization: No Has  patient had a PCN reaction occurring within the last 10 years: No If all of the above answers are "NO", then may proceed with Cephalosporin use.   . Shellfish Allergy Anaphylaxis  . Zithromax [Azithromycin] Rash    Social History   Socioeconomic History  . Marital status: Single    Spouse name: Not on file  . Number of children: Not on file  . Years of education: Not on file  . Highest education level: Not on file  Occupational History  . Not on file  Tobacco Use  . Smoking status: Current Every Day Smoker    Packs/day: 1.00    Types: Cigarettes  . Smokeless tobacco: Never Used  . Tobacco  comment: 3/4 of pack  Vaping Use  . Vaping Use: Never used  Substance and Sexual Activity  . Alcohol use: Not Currently    Alcohol/week: 1.0 standard drink    Types: 1 Standard drinks or equivalent per week  . Drug use: No  . Sexual activity: Yes    Partners: Male    Birth control/protection: Surgical    Comment: btl  Other Topics Concern  . Not on file  Social History Narrative  . Not on file   Social Determinants of Health   Financial Resource Strain:   . Difficulty of Paying Living Expenses: Not on file  Food Insecurity:   . Worried About Programme researcher, broadcasting/film/video in the Last Year: Not on file  . Ran Out of Food in the Last Year: Not on file  Transportation Needs:   . Lack of Transportation (Medical): Not on file  . Lack of Transportation (Non-Medical): Not on file  Physical Activity:   . Days of Exercise per Week: Not on file  . Minutes of Exercise per Session: Not on file  Stress:   . Feeling of Stress : Not on file  Social Connections:   . Frequency of Communication with Friends and Family: Not on file  . Frequency of Social Gatherings with Friends and Family: Not on file  . Attends Religious Services: Not on file  . Active Member of Clubs or Organizations: Not on file  . Attends Banker Meetings: Not on file  . Marital Status: Not on file  Intimate Partner  Violence:   . Fear of Current or Ex-Partner: Not on file  . Emotionally Abused: Not on file  . Physically Abused: Not on file  . Sexually Abused: Not on file     ROS- All systems are reviewed and negative except as per the HPI above.  Physical Exam: Vitals:   03/08/20 0903  BP: 110/80  Pulse: (!) 148  Weight: 125.8 kg  Height: 5\' 5"  (1.651 m)    GEN- The patient is well appearing obese female, alert and oriented x 3 today.   HEENT-head normocephalic, atraumatic, sclera clear, conjunctiva pink, hearing intact, trachea midline. Lungs- Clear to ausculation bilaterally, normal work of breathing Heart- Regular rate and rhythm, tachycardia, no murmurs, rubs or gallops  GI- soft, NT, ND, + BS Extremities- no clubbing, cyanosis, or edema MS- no significant deformity or atrophy Skin- no rash or lesion Psych- euthymic mood, full affect Neuro- strength and sensation are intact   Wt Readings from Last 3 Encounters:  03/08/20 125.8 kg  11/16/19 120.7 kg  11/10/19 120.1 kg    EKG today demonstrates typical atrial flutter with 2:1 conduction HR 148, QRS 78, QTc 474  Echo 12/05/17 demonstrated  - Left ventricle: The cavity size was normal. Systolic function was normal. The estimated ejection fraction was in the range of 55% to 60%. Wall motion was normal; there were no regional wall motion abnormalities. Left ventricular diastolic function parameters were normal. - Left atrium: The atrium was moderately dilated. - Atrial septum: No defect or patent foramen ovale was identified.  LA 67mm  LHC 12/04/17 1. No angiographic evidence of CAD 2. Mild elevation LV filling pressures 3. Non-cardiac chest pain  Epic records are reviewed at length today  CHA2DS2-VASc Score = 2 The patient's score is based upon: CHF History: No HTN History: Yes Age : < 65 Diabetes History: No Stroke History: No Vascular Disease History: No Gender: Female   ASSESSMENT AND PLAN: 1.  Paroxysmal Atrial Fibrillation/typical atrial flutter The patient's CHA2DS2-VASc score is 2, indicating a 2.2% annual risk of stroke. Given her clear onset of symptoms <48 hours ago and her symptoms,  recommend urgent DCCV in the ED. Her Eliquis will need to be resumed and continued for at least 4 weeks post DCCV. Continue Multaq 400 mg BID. Will arrange follow up with EP to consider flutter ablation. Continue Toprol to 100 mg daily. Continue Lopressor 25 mg PRN q 6hours for heart racing.  2. HTN Stable, no changes today.  3. Obesity Body mass index is 46.16 kg/m. Lifestyle modification was discussed and encouraged including regular physical activity and weight reduction.  4. Obstructive sleep apnea Patient reports compliance with CPAP therapy.   Patient to ED to consider urgent DCCV. Follow up with EP to discuss atrial flutter ablation.    Jorja Loaicky Tanaya Dunigan PA-C [facility] [address] [place], [place] [address] [phone] 03/08/2020 9:29 AM

## 2020-03-08 NOTE — ED Provider Notes (Signed)
[facility]   CSN: [id number] Arrival date & time: 03/08/20  [id number]     History No chief complaint on file.   Emily Maldonado is a 44 y.o. female.  HPI 44 year old female with a history of paroxysmal A. fib presents with palpitations and shortness of breath.  Feels like many prior episodes of A. fib.  Started last night at 6 PM with some chest pressure and shortness of breath which is how she typically starts.  On and off palpitations.  Continues to feel short of breath and saw her cardiologist today who sent her to the ER for cardioversion.  She took extra metoprolol with no relief.  She is not on Eliquis due to bleeding side effects.  She has not been feeling ill prior to this.   Past Medical History:  Diagnosis Date  . A-fib (HCC)   . Abnormal Pap smear of cervix   . Anemia    b12  . Anxiety   . B12 deficiency anemia   . Childhood asthma   . Former tobacco use   . Hypertension   . Hypothyroidism   . Migraines    aura  . Morbid obesity (HCC)   . Ovarian cyst   . Sleep apnea    Use C-pap     Patient Active Problem List   Diagnosis Date Noted  . Typical atrial flutter (HCC) 03/08/2020  . GERD (gastroesophageal reflux disease) 10/19/2019  . Low vitamin D level 01/18/2019  . Nicotine dependence with current use 11/17/2018  . Paroxysmal atrial fibrillation (HCC) 11/16/2018  . 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 Pulmonary nodule - needs repeat 12/2018 12/19/2017  . Low vitamin B12 level 12/05/2017  . PVC's (premature ventricular contractions)   . Morbid obesity (HCC)   . Acne 08/22/2017  . Hypothyroidism 08/22/2017  . Hypertension 08/22/2017    Past Surgical History:  Procedure Laterality Date  . BREAST BIOPSY Left 2014  . BREAST EXCISIONAL BIOPSY Right   . CERVICAL BIOPSY  W/ LOOP ELECTRODE EXCISION    . LEFT HEART CATH AND CORONARY ANGIOGRAPHY N/A 12/04/2017   Procedure: LEFT HEART CATH AND CORONARY  ANGIOGRAPHY;  Surgeon: Kathleene HazelMcAlhany, Christopher D, MD;  Location: [facility] INVASIVE CV LAB;  Service: Cardiovascular;  Laterality: N/A;  . TONSILLECTOMY    . TUBAL LIGATION       OB History    Gravida  5   Para      Term      Preterm      AB  2   Living  3     SAB  1   TAB  1   Ectopic      Multiple      Live Births  3           Family History  Problem Relation Age of Onset  . Breast cancer Mother [id number]  . Heart failure Mother   . Atrial fibrillation Mother   . Cervical cancer Mother   . Diabetes Mother   . Hypertension Mother   . Heart failure Father   . Hypertension Father   . Heart failure Maternal Grandmother   . Atrial fibrillation Maternal Grandmother   . Stroke Maternal Grandmother   . Uterine cancer Maternal Grandmother   . Diabetes Brother   . Hypertension Brother   . Uterine cancer Maternal Aunt     Social History   Tobacco Use  . Smoking status: Current Every Day Smoker  Packs/day: 1.00    Types: Cigarettes  . Smokeless tobacco: Never Used  . Tobacco comment: 3/4 of pack  Vaping Use  . Vaping Use: Never used  Substance Use Topics  . Alcohol use: Not Currently    Alcohol/week: 1.0 standard drink    Types: 1 Standard drinks or equivalent per week  . Drug use: No    Home Medications Prior to Admission medications   Medication Sig Start Date End Date Taking? Authorizing Provider  apixaban (ELIQUIS) 5 MG TABS tablet Take 1 tablet (5 mg total) by mouth 2 (two) times daily. 03/08/20 04/07/20  Pricilla Loveless, MD  CHANTIX 1 MG tablet TAKE 1 TABLET BY MOUTH TWICE A DAY 01/17/20   Ardith Dark, MD  cyanocobalamin (,VITAMIN B-12,) 1000 MCG/ML injection INJECT 1 ML INTO MUSCLE DAILY FOR 6 DAYS THEN WEEKLY FOR 1 MONTH THEN MONTHLY THEN 1 ML MONTHLY 12/27/19   Ardith Dark, MD  ferrous sulfate 325 (65 FE) MG tablet Take 325 mg by mouth every other day.    [provider]  levothyroxine (SYNTHROID) 112 MCG tablet TAKE 1 TABLET EVERY DAY ON AN  EMPTY STOMACH 11/24/19   Ardith Dark, MD  magnesium oxide (MAG-OX) 400 MG tablet Take 400 mg by mouth daily.    [provider]  medroxyPROGESTERone (DEPO-PROVERA) 150 MG/ML injection Inject 1 mL (150 mg total) into the muscle once for 1 dose. Patient taking differently: Inject 150 mg into the muscle every 3 (three) months.  07/30/19 11/07/28  Ardith Dark, MD  metoprolol succinate (TOPROL-XL) 100 MG 24 hr tablet Take 1 tablet (100 mg total) by mouth daily. Take with or immediately following a meal. 11/10/19   Fenton, Clint R, PA  metoprolol tartrate (LOPRESSOR) 25 MG tablet TAKE 1 TABLET EVERY 6 HOURS AS NEEDED FOR HR >100 01/17/20   Fenton, Clint R, PA  MULTAQ 400 MG tablet TAKE 1 TABLET BY MOUTH TWICE A DAY WITH A MEAL 02/02/20   Fenton, Clint R, PA  pantoprazole (PROTONIX) 40 MG tablet Take 1 tablet (40 mg total) by mouth daily. 10/19/19   Ardith Dark, MD  Potassium 99 MG TABS Take 99 mg by mouth every other day.    [provider]  SYRINGE-NEEDLE, DISP, 3 ML 22G X 1-1/2" 3 ML MISC Use daily as directed. 05/12/19   Ardith Dark, MD  VITAMIN D PO Take 10,000 Units by mouth daily.     [provider]  flecainide (TAMBOCOR) 50 MG tablet TAKE 1 TABLET BY MOUTH TWICE A DAY Patient not taking: Reported on 11/08/2019 10/20/19 11/08/19  Fenton, Levonne Spiller R, PA    Allergies    Penicillins, Shellfish allergy, and Zithromax [azithromycin]  Review of Systems   Review of Systems  Respiratory: Positive for shortness of breath.   Cardiovascular: Positive for chest pain and palpitations.  Gastrointestinal: Negative for diarrhea and vomiting.  All other systems reviewed and are negative.   Physical Exam Updated Vital Signs BP (!) 134/94 (BP Location: Right Arm)   Pulse 72   Temp 98.3 F (36.8 C) (Oral)   Resp 18   SpO2 99%   Physical Exam Vitals and nursing note reviewed.  Constitutional:      General: She is not in acute distress.    Appearance: She is well-developed.  She is obese. She is not ill-appearing or diaphoretic.  HENT:     Head: Normocephalic and atraumatic.     Right Ear: External ear normal.  Left Ear: External ear normal.     Nose: Nose normal.  Eyes:     General:        Right eye: No discharge.        Left eye: No discharge.  Cardiovascular:     Rate and Rhythm: Regular rhythm. Tachycardia present.     Heart sounds: Normal heart sounds.  Pulmonary:     Effort: Pulmonary effort is normal.     Breath sounds: Normal breath sounds.  Abdominal:     Palpations: Abdomen is soft.     Tenderness: There is no abdominal tenderness.  Skin:    General: Skin is warm and dry.  Neurological:     Mental Status: She is alert.  Psychiatric:        Mood and Affect: Mood is not anxious.     ED Results / Procedures / Treatments   Labs (all labs ordered are listed, but only abnormal results are displayed) Labs Reviewed  BASIC METABOLIC PANEL - Abnormal; Notable for the following components:      Result Value   Glucose, Bld 118 (*)    All other components within normal limits  CBC WITH DIFFERENTIAL/PLATELET - Abnormal; Notable for the following components:   WBC 12.2 (*)    RBC 6.03 (*)    Hemoglobin 16.6 (*)    HCT 52.1 (*)    Lymphs Abs 4.2 (*)    All other components within normal limits  MAGNESIUM    EKG ED ECG REPORT   Date: 03/08/2020  Rate: 147  Rhythm: atrial flutter  QRS Axis: normal  Intervals: normal  ST/T Wave abnormalities: nonspecific T wave changes  Conduction Disutrbances:none  Narrative Interpretation:   Old EKG Reviewed: changes noted  I have personally reviewed the EKG tracing and agree with the computerized printout as noted.     EKG Interpretation  Date/Time:  Wednesday March 08 2020 12:21:10 EDT Ventricular Rate:  77 PR Interval:    QRS Duration: 101 QT Interval:  365 QTC Calculation: 413 R Axis:   100 Text Interpretation: Sinus rhythm Right axis deviation Low voltage, precordial leads  Borderline T abnormalities, inferior leads atrial flutter resolved Confirmed by Pricilla Loveless [phone]) on 03/08/2020 12:59:36 PM       Radiology DG Chest Portable 1 View  Result Date: 03/08/2020 CLINICAL DATA:  A rhythm E a. EXAM: PORTABLE CHEST 1 VIEW COMPARISON:  11/16/2018 FINDINGS: 1004 hours. Low volumes. Interstitial markings are diffusely coarsened with chronic features. Cardiopericardial silhouette is at upper limits of normal for size. The visualized bony structures of the thorax show no acute abnormality. Telemetry leads overlie the chest. IMPRESSION: Borderline cardiomegaly with diffuse chronic interstitial lung disease. Electronically Signed   By: Kennith Center M.D.   On: 03/08/2020 10:23    Procedures .Critical Care Performed by: Pricilla Loveless, MD Authorized by: Pricilla Loveless, MD   Critical care provider statement:    Critical care time (minutes):  30   Critical care time was exclusive of:  Separately billable procedures and treating other patients   Critical care was necessary to treat or prevent imminent or life-threatening deterioration of the following conditions:  Cardiac failure and circulatory failure   Critical care was time spent personally by me on the following activities:  Discussions with consultants, evaluation of patient's response to treatment, examination of patient, ordering and performing treatments and interventions, ordering and review of laboratory studies, ordering and review of radiographic studies, pulse oximetry, re-evaluation of patient's condition, obtaining history from  patient or surrogate and review of old charts .Sedation  Date/Time: 03/08/2020 12:29 PM Performed by: Pricilla Loveless, MD Authorized by: Pricilla Loveless, MD   Consent:    Consent obtained:  Verbal and written   Consent given by:  Patient   Risks discussed:  Allergic reaction, dysrhythmia, inadequate sedation, nausea, prolonged hypoxia resulting in organ damage, prolonged sedation  necessitating reversal, respiratory compromise necessitating ventilatory assistance and intubation and vomiting   Alternatives discussed:  Analgesia without sedation, anxiolysis and regional anesthesia Universal protocol:    Procedure explained and questions answered to patient or proxy's satisfaction: yes     Relevant documents present and verified: yes     Test results available and properly labeled: yes     Imaging studies available: yes     Required blood products, implants, devices, and special equipment available: yes     Site/side marked: yes     Immediately prior to procedure a time out was called: yes     Patient identity confirmation method:  Verbally with patient Indications:    Procedure necessitating sedation performed by:  Physician performing sedation Pre-sedation assessment:    Time since last food or drink:  4 hours   ASA classification: class 2 - patient with mild systemic disease     Neck mobility: normal     Mouth opening:  3 or more finger widths   Thyromental distance:  4 finger widths   Mallampati score:  I - soft palate, uvula, fauces, pillars visible   Pre-sedation assessments completed and reviewed: airway patency, cardiovascular function, hydration status, mental status, nausea/vomiting, pain level, respiratory function and temperature   Immediate pre-procedure details:    Reassessment: Patient reassessed immediately prior to procedure     Reviewed: vital signs, relevant labs/tests and NPO status     Verified: bag valve mask available, emergency equipment available, intubation equipment available, IV patency confirmed, oxygen available and suction available   Procedure details (see MAR for exact dosages):    Preoxygenation:  Nasal cannula   Sedation:  Propofol   Intended level of sedation: deep   Intra-procedure monitoring:  Blood pressure monitoring, cardiac monitor, continuous pulse oximetry, frequent LOC assessments, frequent vital sign checks and continuous  capnometry   Intra-procedure events: none     Total Provider sedation time (minutes):  8 Post-procedure details:    Attendance: Constant attendance by certified staff until patient recovered     Recovery: Patient returned to pre-procedure baseline     Post-sedation assessments completed and reviewed: airway patency, cardiovascular function, hydration status, mental status, nausea/vomiting, pain level, respiratory function and temperature     Patient is stable for discharge or admission: yes     Patient tolerance:  Tolerated well, no immediate complications .Cardioversion  Date/Time: 03/08/2020 12:30 PM Performed by: Pricilla Loveless, MD Authorized by: Pricilla Loveless, MD   Consent:    Consent obtained:  Verbal and written   Consent given by:  Patient   Risks discussed:  Cutaneous burn, death, induced arrhythmia and pain   Alternatives discussed:  Rate-control medication Pre-procedure details:    Cardioversion basis:  Emergent   Rhythm:  Atrial flutter   Electrode placement:  Anterior-posterior Patient sedated: Yes. Refer to sedation procedure documentation for details of sedation.  Attempt one:    Cardioversion mode:  Synchronous   Shock (Joules):  200   Shock outcome:  Conversion to normal sinus rhythm Post-procedure details:    Patient status:  Awake   Patient tolerance of procedure:  Tolerated well,  no immediate complications   (including critical care time)  Medications Ordered in ED Medications  sodium chloride 0.9 % bolus 1,000 mL (0 mLs Intravenous Stopped 03/08/20 1130)  diltiazem (CARDIZEM) injection 10 mg (10 mg Intravenous Given 03/08/20 1056)  propofol (DIPRIVAN) 10 mg/mL bolus/IV push 125.8 mg (80 mg Intravenous Given 03/08/20 1220)    ED Course  I have reviewed the triage vital signs and the nursing notes.  Pertinent labs & imaging results that were available during my care of the patient were reviewed by me and considered in my medical decision making (see chart  for details).    MDM Rules/Calculators/A&P     CHA2DS2-VASc Score: 2                     Patient presents with symptomatic atrial flutter.  She has paroxysmal A. fib and has had a flutter in the past.  Symptoms started last night.  While she is not currently on Eliquis, I think it is reasonable to go ahead and cardiovert.  She wanted to ideally do medical therapy if possible first we tried diltiazem while waiting on labs.  This did not seem to help.  Cardioverted without any obvious immediate complication.  Tolerated it well.  We will give a prescription for 1 month of Eliquis and follow-up in A. fib clinic. Final Clinical Impression(s) / ED Diagnoses Final diagnoses:  Atrial flutter with rapid ventricular response (HCC)    Rx / DC Orders ED Discharge Orders         Ordered    apixaban (ELIQUIS) 5 MG TABS tablet  2 times daily        03/08/20 1338           Pricilla Loveless, MD 03/08/20 1553

## 2020-03-09 ENCOUNTER — Ambulatory Visit (INDEPENDENT_AMBULATORY_CARE_PROVIDER_SITE_OTHER): Payer: Commercial Managed Care - PPO | Admitting: Internal Medicine

## 2020-03-09 ENCOUNTER — Encounter: Payer: Self-pay | Admitting: Internal Medicine

## 2020-03-09 VITALS — BP 154/86 | HR 81 | Ht 65.0 in | Wt 281.0 lb

## 2020-03-09 DIAGNOSIS — I483 Typical atrial flutter: Secondary | ICD-10-CM | POA: Diagnosis not present

## 2020-03-09 DIAGNOSIS — I10 Essential (primary) hypertension: Secondary | ICD-10-CM

## 2020-03-09 DIAGNOSIS — G4733 Obstructive sleep apnea (adult) (pediatric): Secondary | ICD-10-CM | POA: Diagnosis not present

## 2020-03-09 DIAGNOSIS — I480 Paroxysmal atrial fibrillation: Secondary | ICD-10-CM | POA: Diagnosis not present

## 2020-03-09 NOTE — Patient Instructions (Addendum)
Medication Instructions:  Your physician recommends that you continue on your current medications as directed. Please refer to the Current Medication list given to you today.  Labwork: None ordered.  Testing/Procedures: None ordered.  Follow-Up:  SEE INSTRUCTION LETTER  Any Other Special Instructions Will Be Listed Below (If Applicable).  If you need a refill on your cardiac medications before your next appointment, please call your pharmacy.    Cardiac Ablation Cardiac ablation is a procedure to disable (ablate) a small amount of heart tissue in very specific places. The heart has many electrical connections. Sometimes these connections are abnormal and can cause the heart to beat very fast or irregularly. Ablating some of the problem areas can improve the heart rhythm or return it to normal. Ablation may be done for people who:  Have Wolff-Parkinson-White syndrome.  Have fast heart rhythms (tachycardia).  Have taken medicines for an abnormal heart rhythm (arrhythmia) that were not effective or caused side effects.  Have a high-risk heartbeat that may be life-threatening. During the procedure, a small incision is made in the neck or the groin, and a long, thin, flexible tube (catheter) is inserted into the incision and moved to the heart. Small devices (electrodes) on the tip of the catheter will send out electrical currents. A type of X-ray (fluoroscopy) will be used to help guide the catheter and to provide images of the heart. Tell a health care provider about:  Any allergies you have.  All medicines you are taking, including vitamins, herbs, eye drops, creams, and over-the-counter medicines.  Any problems you or family members have had with anesthetic medicines.  Any blood disorders you have.  Any surgeries you have had.  Any medical conditions you have, such as kidney failure.  Whether you are pregnant or may be pregnant. What are the risks? Generally, this is a  safe procedure. However, problems may occur, including:  Infection.  Bruising and bleeding at the catheter insertion site.  Bleeding into the chest, especially into the sac that surrounds the heart. This is a serious complication.  Stroke or blood clots.  Damage to other structures or organs.  Allergic reaction to medicines or dyes.  Need for a permanent pacemaker if the normal electrical system is damaged. A pacemaker is a small computer that sends electrical signals to the heart and helps your heart beat normally.  The procedure not being fully effective. This may not be recognized until months later. Repeat ablation procedures are sometimes required. What happens before the procedure?  Follow instructions from your health care provider about eating or drinking restrictions.  Ask your health care provider about: ? Changing or stopping your regular medicines. This is especially important if you are taking diabetes medicines or blood thinners. ? Taking medicines such as aspirin and ibuprofen. These medicines can thin your blood. Do not take these medicines before your procedure if your health care provider instructs you not to.  Plan to have someone take you home from the hospital or clinic.  If you will be going home right after the procedure, plan to have someone with you for 24 hours. What happens during the procedure?  To lower your risk of infection: ? Your health care team will wash or sanitize their hands. ? Your skin will be washed with soap. ? Hair may be removed from the incision area.  An IV tube will be inserted into one of your veins.  You will be given a medicine to help you relax (sedative).  The   skin on your neck or groin will be numbed.  An incision will be made in your neck or your groin.  A needle will be inserted through the incision and into a large vein in your neck or groin.  A catheter will be inserted into the needle and moved to your  heart.  Dye may be injected through the catheter to help your surgeon see the area of the heart that needs treatment.  Electrical currents will be sent from the catheter to ablate heart tissue in desired areas. There are three types of energy that may be used to ablate heart tissue: ? Heat (radiofrequency energy). ? Laser energy. ? Extreme cold (cryoablation).  When the necessary tissue has been ablated, the catheter will be removed.  Pressure will be held on the catheter insertion area to prevent excessive bleeding.  A bandage (dressing) will be placed over the catheter insertion area. The procedure may vary among health care providers and hospitals. What happens after the procedure?  Your blood pressure, heart rate, breathing rate, and blood oxygen level will be monitored until the medicines you were given have worn off.  Your catheter insertion area will be monitored for bleeding. You will need to lie still for a few hours to ensure that you do not bleed from the catheter insertion area.  Do not drive for 24 hours or as long as directed by your health care provider. Summary  Cardiac ablation is a procedure to disable (ablate) a small amount of heart tissue in very specific places. Ablating some of the problem areas can improve the heart rhythm or return it to normal.  During the procedure, electrical currents will be sent from the catheter to ablate heart tissue in desired areas. This information is not intended to replace advice given to you by your health care provider. Make sure you discuss any questions you have with your health care provider. Document Revised: 12/15/2017 Document Reviewed: 05/13/2016 [employer] Patient Education  2020 [employer]  

## 2020-03-09 NOTE — Progress Notes (Signed)
Electrophysiology Office Note   Date:  03/09/2020   ID:  Emily Maldonado, Emily Maldonado Emily Maldonado, MRN [id number]  PCP:  Ardith Dark, MD   Primary Electrophysiologist: Hillis Range, MD    CC: afib/ atrial flutter   History of Present Illness: Emily Maldonado is a 44 y.o. female who presents today for electrophysiology evaluation.   She is referred by Dr Delton See and Jorja Loa in the AF Clinic. The patient has a h/o obesity, OSA and HTN.  She also has a h/o afib and atrial flutter.  Episodes have increased in frequency and duration over the past year.  She has failed medical therapy with flecainide and multaq.  She is quite symptomatic with her afib.  Today, she denies symptoms of chest pain, shortness of breath, orthopnea, PND, lower extremity edema, claudication, dizziness, presyncope, syncope, bleeding, or neurologic sequela. The patient is tolerating medications without difficulties and is otherwise without complaint today.    Past Medical History:  Diagnosis Date   A-fib (HCC)    Abnormal Pap smear of cervix    Anemia    b12   Anxiety    B12 deficiency anemia    Childhood asthma    Former tobacco use    Hypertension    Hypothyroidism    Migraines    aura   Morbid obesity (HCC)    Ovarian cyst    Sleep apnea    Use C-pap    Past Surgical History:  Procedure Laterality Date   BREAST BIOPSY Left 2014   BREAST EXCISIONAL BIOPSY Right    CERVICAL BIOPSY  W/ LOOP ELECTRODE EXCISION     LEFT HEART CATH AND CORONARY ANGIOGRAPHY N/A 12/04/2017   Procedure: LEFT HEART CATH AND CORONARY ANGIOGRAPHY;  Surgeon: Kathleene Hazel, MD;  Location: [facility];  Service: Cardiovascular;  Laterality: N/A;   TONSILLECTOMY     TUBAL LIGATION       Current Outpatient Medications  Medication Sig Dispense Refill   apixaban (ELIQUIS) 5 MG TABS tablet Take 1 tablet (5 mg total) by mouth 2 (two) times daily. 60 tablet 0   CHANTIX 1 MG tablet TAKE 1 TABLET BY  MOUTH TWICE A DAY 180 tablet 1   cyanocobalamin (,VITAMIN B-12,) 1000 MCG/ML injection INJECT 1 ML INTO MUSCLE DAILY FOR 6 DAYS THEN WEEKLY FOR 1 MONTH THEN MONTHLY THEN 1 ML MONTHLY 11 mL 1   ferrous sulfate 325 (65 FE) MG tablet Take 325 mg by mouth every other day.     levothyroxine (SYNTHROID) 112 MCG tablet TAKE 1 TABLET EVERY DAY ON AN EMPTY STOMACH 90 tablet 1   magnesium oxide (MAG-OX) 400 MG tablet Take 400 mg by mouth daily.     medroxyPROGESTERone (DEPO-PROVERA) 150 MG/ML injection Inject 1 mL (150 mg total) into the muscle once for 1 dose. 1 mL 2   metoprolol succinate (TOPROL-XL) 100 MG 24 hr tablet Take 1 tablet (100 mg total) by mouth daily. Take with or immediately following a meal. 90 tablet 1   metoprolol tartrate (LOPRESSOR) 25 MG tablet TAKE 1 TABLET EVERY 6 HOURS AS NEEDED FOR HR >100 360 tablet 1   MULTAQ 400 MG tablet TAKE 1 TABLET BY MOUTH TWICE A DAY WITH A MEAL 180 tablet 1   Potassium 99 MG TABS Take 99 mg by mouth every other day.     SYRINGE-NEEDLE, DISP, 3 ML 22G X 1-1/2" 3 ML MISC Use daily as directed. 50 each 0   VITAMIN D PO Take  10,000 Units by mouth daily.      No current facility-administered medications for this visit.    Allergies:   Penicillins, Shellfish allergy, and Zithromax [azithromycin]   Social History:  The patient  reports that she has been smoking cigarettes. She has been smoking about 1.00 pack per day. She has never used smokeless tobacco. She reports previous alcohol use of about 1.0 standard drink of alcohol per week. She reports that she does not use drugs.  Family History:  The patient's  family history includes Atrial fibrillation in her maternal grandmother and mother; Breast cancer (age of onset: 78) in her mother; Cervical cancer in her mother; Diabetes in her brother and mother; Heart failure in her father, maternal grandmother, and mother; Hypertension in her brother, father, and mother; Stroke in her maternal grandmother;  Uterine cancer in her maternal aunt and maternal grandmother.    ROS:  Please see the history of present illness.   All other systems are personally reviewed and negative.    PHYSICAL EXAM: VS:  BP (!) 154/86    Pulse 81    Ht 5\' 5"  (1.651 m)    Wt 281 lb (127.5 kg)    SpO2 94%    BMI 46.76 kg/m  , BMI Body mass index is 46.76 kg/m. GEN: Well nourished, well developed, in no acute distress HEENT: normal Neck: no JVD, carotid bruits, or masses Cardiac: RRR; no murmurs, rubs, or gallops,no edema  Respiratory:  clear to auscultation bilaterally, normal work of breathing GI: soft, nontender, nondistended, + BS MS: no deformity or atrophy Skin: warm and dry  Neuro:  Strength and sensation are intact Psych: euthymic mood, full affect  EKG:  EKG is ordered today. The ekg ordered today is personally reviewed and shows sinus rhythm   Recent Labs: 11/24/2019: TSH 1.40 03/08/2020: BUN 14; Creatinine, Ser 0.91; Hemoglobin 16.6; Magnesium 1.8; Platelets 301; Potassium 4.1; Sodium 141  personally reviewed   Lipid Panel     Component Value Date/Time   CHOL 155 12/04/2017 0539   TRIG 175 (H) 12/04/2017 0539   HDL 28 (L) 12/04/2017 0539   CHOLHDL 5.5 12/04/2017 0539   VLDL 35 12/04/2017 0539   LDLCALC 92 12/04/2017 0539   personally reviewed   Wt Readings from Last 3 Encounters:  03/09/20 281 lb (127.5 kg)  03/08/20 277 lb 6.4 oz (125.8 kg)  11/16/19 266 lb 3.2 oz (120.7 kg)      Other studies personally reviewed: Additional studies/ records that were reviewed today include: AF clinic notes, prior echo  Review of the above records today demonstrates: as above  ekg today reveals sinus rhythm  ASSESSMENT AND PLAN:  1.  Paroxysmal atrial fibrillation/ atrial flutter The patient has symptomatic, recurrent paroxysmal atrial fibrillation and typical atrial flutter. she has failed medical therapy with flecainide and multaq. Chads2vasc score is 2.  she is anticoagulated with eliquis  . Therapeutic strategies for afib including medicine and ablation were discussed in detail with the patient today. Risk, benefits, and alternatives to EP study and radiofrequency ablation for afib were also discussed in detail today. These risks include but are not limited to stroke, bleeding, vascular damage, tamponade, perforation, damage to the esophagus, lungs, and other structures, pulmonary vein stenosis, worsening renal function, and death. The patient understands these risk and wishes to proceed.  We will therefore proceed with catheter ablation at the next available time.  Carto, ICE, anesthesia are requested for the procedure.  Will also obtain cardiac CT prior  to the procedure to exclude LAA thrombus and further evaluate atrial anatomy.  2. Obesity Body mass index is 46.76 kg/m. Lifestyle modification is advised  3. OSA She is compliant with CPAP  4. HTN Stable No change required today  Current medicines are reviewed at length with the patient today.   The patient does not have concerns regarding her medicines.  The following changes were made today:  none  Labs/ tests ordered today include:  No orders of the defined types were placed in this encounter.    Randolm Idol, MD  03/09/2020 4:37 PM     [facility] [address] [place] [address] [phone] (office) [phone] (fax)

## 2020-03-20 ENCOUNTER — Encounter: Payer: Self-pay | Admitting: Family Medicine

## 2020-03-21 MED ORDER — APIXABAN 5 MG PO TABS: 5.0000 mg | ORAL_TABLET | Freq: Two times a day (BID) | ORAL | 11 refills | Status: DC

## 2020-03-23 NOTE — Telephone Encounter (Signed)
Please advise 

## 2020-03-24 ENCOUNTER — Other Ambulatory Visit: Payer: Self-pay | Admitting: *Deleted

## 2020-03-24 MED ORDER — OMEPRAZOLE 40 MG PO CPDR: 40.0000 mg | DELAYED_RELEASE_CAPSULE | Freq: Two times a day (BID) | ORAL | 3 refills | Status: DC

## 2020-03-24 NOTE — Telephone Encounter (Signed)
LVM to patient waiting for respond from PCP.

## 2020-03-24 NOTE — Telephone Encounter (Signed)
Please advise  Rx Depo provera ok to refill

## 2020-03-27 ENCOUNTER — Other Ambulatory Visit: Payer: Self-pay | Admitting: *Deleted

## 2020-03-27 MED ORDER — MEDROXYPROGESTERONE ACETATE 150 MG/ML IM SUSP: 150.0000 mg | Freq: Once | INTRAMUSCULAR | 2 refills | Status: DC

## 2020-03-27 MED ORDER — OMEPRAZOLE 40 MG PO CPDR: 40.0000 mg | DELAYED_RELEASE_CAPSULE | Freq: Two times a day (BID) | ORAL | 3 refills | Status: DC

## 2020-04-07 ENCOUNTER — Telehealth: Payer: Self-pay | Admitting: Internal Medicine

## 2020-04-07 NOTE — Telephone Encounter (Signed)
Called and spoke to patient. She is suppose to have a Cardiac CT prior to her ablation on 10/12 and she has not bee called to be scheduled yet. I let the patient know that I will send a message to the CT scheduler and the pre-cert team to expedite getting this scheduled. Answered all questions regarding medications. Patient appreciated the call.

## 2020-04-07 NOTE — Telephone Encounter (Signed)
ew message:    Patient calling because she thought she was going to have CT but she not heard anything and she has some questions concering some medications. Please call patient.

## 2020-04-10 ENCOUNTER — Other Ambulatory Visit: Payer: Self-pay

## 2020-04-10 ENCOUNTER — Other Ambulatory Visit: Payer: Commercial Managed Care - PPO | Admitting: *Deleted

## 2020-04-10 DIAGNOSIS — I483 Typical atrial flutter: Secondary | ICD-10-CM

## 2020-04-10 DIAGNOSIS — I480 Paroxysmal atrial fibrillation: Secondary | ICD-10-CM

## 2020-04-10 LAB — BASIC METABOLIC PANEL
BUN/Creatinine Ratio: 8 — ABNORMAL LOW (ref 9–23)
BUN: 7 mg/dL (ref 6–24)
CO2: 22 mmol/L (ref 20–29)
Calcium: 9 mg/dL (ref 8.7–10.2)
Chloride: 102 mmol/L (ref 96–106)
Creatinine, Ser: 0.89 mg/dL (ref 0.57–1.00)
GFR calc Af Amer: 90 mL/min/{1.73_m2} (ref >59)
GFR calc non Af Amer: 78 mL/min/{1.73_m2} (ref >59)
Glucose: 91 mg/dL (ref 65–99)
Potassium: 4 mmol/L (ref 3.5–5.2)
Sodium: 137 mmol/L (ref 134–144)

## 2020-04-10 LAB — CBC WITH DIFFERENTIAL/PLATELET
Basophils Absolute: 0.1 10*3/uL (ref 0.0–0.2)
Basos: 1 %
EOS (ABSOLUTE): 0.1 10*3/uL (ref 0.0–0.4)
Eos: 1 %
Hematocrit: 44 % (ref 34.0–46.6)
Hemoglobin: 15.1 g/dL (ref 11.1–15.9)
Immature Grans (Abs): 0 10*3/uL (ref 0.0–0.1)
Immature Granulocytes: 0 %
Lymphocytes Absolute: 3.5 10*3/uL — ABNORMAL HIGH (ref 0.7–3.1)
Lymphs: 33 %
MCH: 28.5 pg (ref 26.6–33.0)
MCHC: 34.3 g/dL (ref 31.5–35.7)
MCV: 83 fL (ref 79–97)
Monocytes Absolute: 1 10*3/uL — ABNORMAL HIGH (ref 0.1–0.9)
Monocytes: 10 %
Neutrophils Absolute: 5.7 10*3/uL (ref 1.4–7.0)
Neutrophils: 55 %
Platelets: 287 10*3/uL (ref 150–450)
RBC: 5.29 x10E6/uL — ABNORMAL HIGH (ref 3.77–5.28)
RDW: 13.1 % (ref 11.7–15.4)
WBC: 10.4 10*3/uL (ref 3.4–10.8)

## 2020-04-13 ENCOUNTER — Telehealth (HOSPITAL_COMMUNITY): Payer: Self-pay | Admitting: Emergency Medicine

## 2020-04-13 NOTE — Telephone Encounter (Signed)
Pt returning phone call regarding upcoming cardiac imaging study; pt verbalizes understanding of appt date/time, parking situation and where to check in, pre-test NPO status and medications ordered, and verified current allergies; name and call back number provided for further questions should they arise Rockwell Alexandria RN Navigator Cardiac Imaging [name] [phone] office [phone] cell   Pt instructed to take additional 100mg  metoprolol tartrate 2 hr prior to scan. Pt verbalized understanding

## 2020-04-13 NOTE — Telephone Encounter (Signed)
Attempted to call patient regarding upcoming cardiac CT appointment. °Left message on voicemail with name and callback number °Marliyah Reid RN Navigator Cardiac Imaging °[facility] °[phone] Office °[phone] Cell ° °

## 2020-04-14 ENCOUNTER — Other Ambulatory Visit: Payer: Self-pay

## 2020-04-14 ENCOUNTER — Encounter (HOSPITAL_COMMUNITY)
Admission: RE | Admit: 2020-04-14 | Discharge: 2020-04-14 | Disposition: A | Discharge status: Home / Self Care | Payer: Commercial Managed Care - PPO | Source: Ambulatory Visit | Attending: Internal Medicine | Admitting: Internal Medicine

## 2020-04-14 DIAGNOSIS — I480 Paroxysmal atrial fibrillation: Secondary | ICD-10-CM

## 2020-04-14 MED ORDER — IOHEXOL 350 MG/ML SOLN
80.0000 mL | Freq: Once | INTRAVENOUS | Status: AC | PRN
  Administered 2020-04-14: 80 mL via INTRAVENOUS

## 2020-04-17 ENCOUNTER — Other Ambulatory Visit (HOSPITAL_COMMUNITY)
Admission: RE | Admit: 2020-04-17 | Discharge: 2020-04-17 | Disposition: A | Discharge status: Home / Self Care | Payer: Commercial Managed Care - PPO | Source: Ambulatory Visit | Attending: Internal Medicine | Admitting: Internal Medicine

## 2020-04-17 DIAGNOSIS — Z20822 Contact with and (suspected) exposure to covid-19: Secondary | ICD-10-CM | POA: Diagnosis not present

## 2020-04-17 DIAGNOSIS — Z01818 Encounter for other preprocedural examination: Secondary | ICD-10-CM | POA: Insufficient documentation

## 2020-04-17 LAB — SARS CORONAVIRUS 2 (TAT 6-24 HRS): SARS Coronavirus 2: NEGATIVE

## 2020-04-17 NOTE — Anesthesia Preprocedure Evaluation (Addendum)
Anesthesia Evaluation  Patient identified by MRN, date of birth, ID band Patient awake    Reviewed: Allergy & Precautions, NPO status , Patient's Chart, lab work & pertinent test results, reviewed documented beta blocker date and time   Airway Mallampati: III  TM Distance: >3 FB Neck ROM: Full    Dental  (+) Dental Advisory Given   Pulmonary asthma , sleep apnea , Current Smoker,    breath sounds clear to auscultation       Cardiovascular hypertension, Pt. on medications and Pt. on home beta blockers + dysrhythmias Atrial Fibrillation  Rhythm:Regular Rate:Normal     Neuro/Psych  Headaches,    GI/Hepatic Neg liver ROS, GERD  ,  Endo/Other  Hypothyroidism Morbid obesity  Renal/GU negative Renal ROS     Musculoskeletal   Abdominal   Peds  Hematology negative hematology ROS (+)   Anesthesia Other Findings   Reproductive/Obstetrics                            Anesthesia Physical Anesthesia Plan  ASA: III  Anesthesia Plan: General   Post-op Pain Management:    Induction: Intravenous  PONV Risk Score and Plan: 2 and Dexamethasone, Ondansetron and Treatment may vary due to age or medical condition  Airway Management Planned: Oral ETT  Additional Equipment:   Intra-op Plan:   Post-operative Plan: Extubation in OR  Informed Consent: I have reviewed the patients History and Physical, chart, labs and discussed the procedure including the risks, benefits and alternatives for the proposed anesthesia with the patient or authorized representative who has indicated his/her understanding and acceptance.     Dental advisory given  Plan Discussed with: CRNA  Anesthesia Plan Comments:        Anesthesia Quick Evaluation

## 2020-04-17 NOTE — Progress Notes (Signed)
Attempted to call patient regarding procedure instructions for tomorrow.  Left message regarding nothing to eat of drink after midnight, take Eliquis doses today.  Take no medications in the morning.  You will need responsible adult to drive you home tomorrow as well as stay overnight with you.

## 2020-04-18 ENCOUNTER — Other Ambulatory Visit: Payer: Self-pay

## 2020-04-18 ENCOUNTER — Encounter (HOSPITAL_COMMUNITY): Payer: Self-pay | Admitting: Internal Medicine

## 2020-04-18 ENCOUNTER — Ambulatory Visit (HOSPITAL_COMMUNITY)
Admission: RE | Admit: 2020-04-18 | Discharge: 2020-04-18 | Disposition: A | Discharge status: Home / Self Care | Payer: Commercial Managed Care - PPO | Attending: Internal Medicine | Admitting: Internal Medicine

## 2020-04-18 ENCOUNTER — Ambulatory Visit (HOSPITAL_COMMUNITY): Payer: Commercial Managed Care - PPO | Admitting: Anesthesiology

## 2020-04-18 ENCOUNTER — Encounter (HOSPITAL_COMMUNITY)
Admission: RE | Disposition: A | Discharge status: Home / Self Care | Payer: Commercial Managed Care - PPO | Source: Home / Self Care | Attending: Internal Medicine

## 2020-04-18 DIAGNOSIS — I483 Typical atrial flutter: Secondary | ICD-10-CM

## 2020-04-18 DIAGNOSIS — Z7901 Long term (current) use of anticoagulants: Secondary | ICD-10-CM | POA: Insufficient documentation

## 2020-04-18 DIAGNOSIS — F17210 Nicotine dependence, cigarettes, uncomplicated: Secondary | ICD-10-CM | POA: Insufficient documentation

## 2020-04-18 DIAGNOSIS — D649 Anemia, unspecified: Secondary | ICD-10-CM | POA: Insufficient documentation

## 2020-04-18 DIAGNOSIS — Z79899 Other long term (current) drug therapy: Secondary | ICD-10-CM | POA: Diagnosis not present

## 2020-04-18 DIAGNOSIS — Z79890 Hormone replacement therapy (postmenopausal): Secondary | ICD-10-CM | POA: Diagnosis not present

## 2020-04-18 DIAGNOSIS — Z881 Allergy status to other antibiotic agents status: Secondary | ICD-10-CM | POA: Insufficient documentation

## 2020-04-18 DIAGNOSIS — Z793 Long term (current) use of hormonal contraceptives: Secondary | ICD-10-CM | POA: Diagnosis not present

## 2020-04-18 DIAGNOSIS — I10 Essential (primary) hypertension: Secondary | ICD-10-CM | POA: Insufficient documentation

## 2020-04-18 DIAGNOSIS — Z833 Family history of diabetes mellitus: Secondary | ICD-10-CM | POA: Diagnosis not present

## 2020-04-18 DIAGNOSIS — Z6841 Body Mass Index (BMI) 40.0 and over, adult: Secondary | ICD-10-CM | POA: Insufficient documentation

## 2020-04-18 DIAGNOSIS — I480 Paroxysmal atrial fibrillation: Secondary | ICD-10-CM

## 2020-04-18 DIAGNOSIS — Z880 Allergy status to penicillin: Secondary | ICD-10-CM | POA: Insufficient documentation

## 2020-04-18 DIAGNOSIS — G4733 Obstructive sleep apnea (adult) (pediatric): Secondary | ICD-10-CM | POA: Insufficient documentation

## 2020-04-18 DIAGNOSIS — E039 Hypothyroidism, unspecified: Secondary | ICD-10-CM | POA: Insufficient documentation

## 2020-04-18 DIAGNOSIS — Z8249 Family history of ischemic heart disease and other diseases of the circulatory system: Secondary | ICD-10-CM | POA: Diagnosis not present

## 2020-04-18 HISTORY — PX: ATRIAL FIBRILLATION ABLATION: EP1191

## 2020-04-18 LAB — POCT ACTIVATED CLOTTING TIME: Activated Clotting Time: 318 seconds

## 2020-04-18 LAB — HCG, SERUM, QUALITATIVE: Preg, Serum: NEGATIVE

## 2020-04-18 SURGERY — ATRIAL FIBRILLATION ABLATION
Anesthesia: General

## 2020-04-18 MED ORDER — ISOPROTERENOL HCL 0.2 MG/ML IJ SOLN
INTRAMUSCULAR | Status: AC
  Filled 2020-04-18: qty 5

## 2020-04-18 MED ORDER — LIDOCAINE 2% (20 MG/ML) 5 ML SYRINGE
INTRAMUSCULAR | Status: DC | PRN
  Administered 2020-04-18: 60 mg via INTRAVENOUS
  Administered 2020-04-18: 40 mg via INTRAVENOUS

## 2020-04-18 MED ORDER — ACETAMINOPHEN 325 MG PO TABS
ORAL_TABLET | ORAL | Status: AC
  Filled 2020-04-18: qty 2

## 2020-04-18 MED ORDER — ONDANSETRON HCL 4 MG/2ML IJ SOLN: 4.0000 mg | Freq: Four times a day (QID) | INTRAMUSCULAR | Status: DC | PRN

## 2020-04-18 MED ORDER — GLYCOPYRROLATE 0.2 MG/ML IJ SOLN
INTRAMUSCULAR | Status: DC | PRN
  Administered 2020-04-18: .2 mg via INTRAVENOUS

## 2020-04-18 MED ORDER — SODIUM CHLORIDE 0.9% FLUSH: 3.0000 mL | INTRAVENOUS | Status: DC | PRN

## 2020-04-18 MED ORDER — SODIUM CHLORIDE 0.9 % IV SOLN: 250.0000 mL | INTRAVENOUS | Status: DC | PRN

## 2020-04-18 MED ORDER — PROTAMINE SULFATE 10 MG/ML IV SOLN
INTRAVENOUS | Status: DC | PRN
  Administered 2020-04-18: 30 mg via INTRAVENOUS
  Administered 2020-04-18: 10 mg via INTRAVENOUS

## 2020-04-18 MED ORDER — ONDANSETRON HCL 4 MG/2ML IJ SOLN
INTRAMUSCULAR | Status: DC | PRN
  Administered 2020-04-18: 4 mg via INTRAVENOUS

## 2020-04-18 MED ORDER — PANTOPRAZOLE SODIUM 40 MG PO TBEC: 40.0000 mg | DELAYED_RELEASE_TABLET | Freq: Every day | ORAL | 0 refills | Status: DC

## 2020-04-18 MED ORDER — HEPARIN (PORCINE) IN NACL 1000-0.9 UT/500ML-% IV SOLN
INTRAVENOUS | Status: AC
  Filled 2020-04-18: qty 500

## 2020-04-18 MED ORDER — ACETAMINOPHEN 325 MG PO TABS
650.0000 mg | ORAL_TABLET | ORAL | Status: DC | PRN
  Administered 2020-04-18: 650 mg via ORAL

## 2020-04-18 MED ORDER — ROCURONIUM BROMIDE 10 MG/ML (PF) SYRINGE
PREFILLED_SYRINGE | INTRAVENOUS | Status: DC | PRN
  Administered 2020-04-18: 50 mg via INTRAVENOUS
  Administered 2020-04-18: 20 mg via INTRAVENOUS
  Administered 2020-04-18: 30 mg via INTRAVENOUS

## 2020-04-18 MED ORDER — HYDROCODONE-ACETAMINOPHEN 5-325 MG PO TABS: 1.0000 | ORAL_TABLET | ORAL | Status: DC | PRN

## 2020-04-18 MED ORDER — HEPARIN (PORCINE) IN NACL 1000-0.9 UT/500ML-% IV SOLN
INTRAVENOUS | Status: DC | PRN
  Administered 2020-04-18: 500 mL

## 2020-04-18 MED ORDER — MIDAZOLAM HCL 5 MG/5ML IJ SOLN
INTRAMUSCULAR | Status: DC | PRN
  Administered 2020-04-18: 2 mg via INTRAVENOUS

## 2020-04-18 MED ORDER — SODIUM CHLORIDE 0.9 % IV SOLN: INTRAVENOUS | Status: DC

## 2020-04-18 MED ORDER — HEPARIN SODIUM (PORCINE) 1000 UNIT/ML IJ SOLN
INTRAMUSCULAR | Status: AC
  Filled 2020-04-18: qty 2

## 2020-04-18 MED ORDER — DEXMEDETOMIDINE (PRECEDEX) IN NS 20 MCG/5ML (4 MCG/ML) IV SYRINGE
PREFILLED_SYRINGE | INTRAVENOUS | Status: DC | PRN
  Administered 2020-04-18: 8 ug via INTRAVENOUS

## 2020-04-18 MED ORDER — FENTANYL CITRATE (PF) 250 MCG/5ML IJ SOLN
INTRAMUSCULAR | Status: DC | PRN
Start: 2020-04-18 — End: 2020-04-18
  Administered 2020-04-18 (×2): 50 ug via INTRAVENOUS

## 2020-04-18 MED ORDER — DEXAMETHASONE SODIUM PHOSPHATE 10 MG/ML IJ SOLN
INTRAMUSCULAR | Status: DC | PRN
  Administered 2020-04-18: 10 mg via INTRAVENOUS

## 2020-04-18 MED ORDER — HEPARIN SODIUM (PORCINE) 1000 UNIT/ML IJ SOLN
INTRAMUSCULAR | Status: DC | PRN
  Administered 2020-04-18: 1000 [IU] via INTRAVENOUS
  Administered 2020-04-18: 14000 [IU] via INTRAVENOUS

## 2020-04-18 MED ORDER — APIXABAN 5 MG PO TABS
5.0000 mg | ORAL_TABLET | ORAL | Status: AC
  Administered 2020-04-18: 5 mg via ORAL
  Filled 2020-04-18: qty 1

## 2020-04-18 MED ORDER — ALBUTEROL SULFATE HFA 108 (90 BASE) MCG/ACT IN AERS
INHALATION_SPRAY | RESPIRATORY_TRACT | Status: DC | PRN
  Administered 2020-04-18 (×2): 2 via RESPIRATORY_TRACT

## 2020-04-18 MED ORDER — ISOPROTERENOL HCL 0.2 MG/ML IJ SOLN
INTRAVENOUS | Status: DC | PRN
  Administered 2020-04-18: 20 ug/min via INTRAVENOUS

## 2020-04-18 MED ORDER — PROPOFOL 10 MG/ML IV BOLUS
INTRAVENOUS | Status: DC | PRN
  Administered 2020-04-18 (×2): 50 mg via INTRAVENOUS
  Administered 2020-04-18: 200 mg via INTRAVENOUS

## 2020-04-18 MED ORDER — ACETAMINOPHEN 500 MG PO TABS
1000.0000 mg | ORAL_TABLET | Freq: Once | ORAL | Status: AC
  Administered 2020-04-18: 1000 mg via ORAL
  Filled 2020-04-18 (×2): qty 2

## 2020-04-18 MED ORDER — SUGAMMADEX SODIUM 200 MG/2ML IV SOLN
INTRAVENOUS | Status: DC | PRN
  Administered 2020-04-18: 200 mg via INTRAVENOUS

## 2020-04-18 MED ORDER — HEPARIN SODIUM (PORCINE) 1000 UNIT/ML IJ SOLN
INTRAMUSCULAR | Status: DC | PRN
  Administered 2020-04-18: 1000 [IU] via INTRAVENOUS

## 2020-04-18 MED ORDER — SODIUM CHLORIDE 0.9% FLUSH: 3.0000 mL | Freq: Two times a day (BID) | INTRAVENOUS | Status: DC

## 2020-04-18 SURGICAL SUPPLY — 19 items
BLANKET WARM UNDERBOD FULL ACC (MISCELLANEOUS) ×2 IMPLANT
CATH 8FR REPROCESSED SOUNDSTAR (CATHETERS) ×2 IMPLANT
CATH MAPPNG PENTARAY F 2-6-2MM (CATHETERS) ×1 IMPLANT
CATH SMTCH THERMOCOOL SF DF (CATHETERS) ×2 IMPLANT
CATH WEBSTER BI DIR CS D-F CRV (CATHETERS) ×2 IMPLANT
CLOSURE PERCLOSE PROSTYLE (VASCULAR PRODUCTS) ×6 IMPLANT
COVER SWIFTLINK CONNECTOR (BAG) ×2 IMPLANT
MAT PREVALON FULL STRYKER (MISCELLANEOUS) ×2 IMPLANT
NEEDLE BAYLIS TRANSSEPTAL 71CM (NEEDLE) ×2 IMPLANT
PACK EP LATEX FREE (CUSTOM PROCEDURE TRAY) ×1
PACK EP LF (CUSTOM PROCEDURE TRAY) ×1 IMPLANT
PAD PRO RADIOLUCENT 2001M-C (PAD) ×2 IMPLANT
PATCH CARTO3 (PAD) ×2 IMPLANT
PENTARAY F 2-6-2MM (CATHETERS) ×2
SHEATH PINNACLE 7F 10CM (SHEATH) ×4 IMPLANT
SHEATH PINNACLE 9F 10CM (SHEATH) ×2 IMPLANT
SHEATH PROBE COVER 6X72 (BAG) ×2 IMPLANT
SHEATH SWARTZ TS SL2 63CM 8.5F (SHEATH) ×2 IMPLANT
TUBING SMART ABLATE COOLFLOW (TUBING) ×2 IMPLANT

## 2020-04-18 NOTE — H&P (Signed)
CC: afib/ atrial flutter   History of Present Illness: Emily Maldonado is a 44 y.o. female who presents today for electrophysiology study and ablation of afib.     The patient has a h/o obesity, OSA and HTN.  She also has a h/o afib and atrial flutter.  Episodes have increased in frequency and duration over the past year.  She has failed medical therapy with flecainide and multaq.  She is quite symptomatic with her afib.  Today, she denies symptoms of chest pain, shortness of breath, orthopnea, PND, lower extremity edema, claudication, dizziness, presyncope, syncope, bleeding, or neurologic sequela. The patient is tolerating medications without difficulties and is otherwise without complaint today.        Past Medical History:  Diagnosis Date  . A-fib (HCC)   . Abnormal Pap smear of cervix   . Anemia    b12  . Anxiety   . B12 deficiency anemia   . Childhood asthma   . Former tobacco use   . Hypertension   . Hypothyroidism   . Migraines    aura  . Morbid obesity (HCC)   . Ovarian cyst   . Sleep apnea    Use C-pap         Past Surgical History:  Procedure Laterality Date  . BREAST BIOPSY Left 2014  . BREAST EXCISIONAL BIOPSY Right   . CERVICAL BIOPSY  W/ LOOP ELECTRODE EXCISION    . LEFT HEART CATH AND CORONARY ANGIOGRAPHY N/A 12/04/2017   Procedure: LEFT HEART CATH AND CORONARY ANGIOGRAPHY;  Surgeon: Kathleene Hazel, MD;  Location: [facility];  Service: Cardiovascular;  Laterality: N/A;  . TONSILLECTOMY    . TUBAL LIGATION             Current Outpatient Medications  Medication Sig Dispense Refill  . apixaban (ELIQUIS) 5 MG TABS tablet Take 1 tablet (5 mg total) by mouth 2 (two) times daily. 60 tablet 0  . CHANTIX 1 MG tablet TAKE 1 TABLET BY MOUTH TWICE A DAY 180 tablet 1  . cyanocobalamin (,VITAMIN B-12,) 1000 MCG/ML injection INJECT 1 ML INTO MUSCLE DAILY FOR 6 DAYS THEN WEEKLY FOR 1 MONTH THEN MONTHLY THEN 1 ML MONTHLY 11 mL 1   . ferrous sulfate 325 (65 FE) MG tablet Take 325 mg by mouth every other day.    . levothyroxine (SYNTHROID) 112 MCG tablet TAKE 1 TABLET EVERY DAY ON AN EMPTY STOMACH 90 tablet 1  . magnesium oxide (MAG-OX) 400 MG tablet Take 400 mg by mouth daily.    . medroxyPROGESTERone (DEPO-PROVERA) 150 MG/ML injection Inject 1 mL (150 mg total) into the muscle once for 1 dose. 1 mL 2  . metoprolol succinate (TOPROL-XL) 100 MG 24 hr tablet Take 1 tablet (100 mg total) by mouth daily. Take with or immediately following a meal. 90 tablet 1  . metoprolol tartrate (LOPRESSOR) 25 MG tablet TAKE 1 TABLET EVERY 6 HOURS AS NEEDED FOR HR >100 360 tablet 1  . MULTAQ 400 MG tablet TAKE 1 TABLET BY MOUTH TWICE A DAY WITH A MEAL 180 tablet 1  . Potassium 99 MG TABS Take 99 mg by mouth every other day.    . SYRINGE-NEEDLE, DISP, 3 ML 22G X 1-1/2" 3 ML MISC Use daily as directed. 50 each 0  . VITAMIN D PO Take 10,000 Units by mouth daily.      No current facility-administered medications for this visit.    Allergies:   Penicillins, Shellfish allergy, and Zithromax [  azithromycin]   Social History:  The patient  reports that she has been smoking cigarettes. She has been smoking about 1.00 pack per day. She has never used smokeless tobacco. She reports previous alcohol use of about 1.0 standard drink of alcohol per week. She reports that she does not use drugs.  Family History:  The patient's  family history includes Atrial fibrillation in her maternal grandmother and mother; Breast cancer (age of onset: 99) in her mother; Cervical cancer in her mother; Diabetes in her brother and mother; Heart failure in her father, maternal grandmother, and mother; Hypertension in her brother, father, and mother; Stroke in her maternal grandmother; Uterine cancer in her maternal aunt and maternal grandmother.    ROS:  Please see the history of present illness.   All other systems are personally reviewed and negative.     PHYSICAL EXAM: Vitals:   04/18/20 0534  BP: (!) 159/78  Pulse: 85  Resp: 16  Temp: 99.1 F (37.3 C)  SpO2: 99%    GEN: Well nourished, well developed, in no acute distress HEENT: normal Neck: no JVD, carotid bruits, or masses Cardiac: RRR; no murmurs, rubs, or gallops,no edema  Respiratory:  clear to auscultation bilaterally, normal work of breathing GI: soft, nontender, nondistended, + BS MS: no deformity or atrophy Skin: warm and dry  Neuro:  Strength and sensation are intact Psych: euthymic mood, full affect    ASSESSMENT AND PLAN:  1.  Paroxysmal atrial fibrillation/ atrial flutter The patient has symptomatic, recurrent paroxysmal atrial fibrillation and typical atrial flutter. she has failed medical therapy with flecainide and multaq. Chads2vasc score is 2.  she is anticoagulated with eliquis . Risk, benefits, and alternatives to EP study and radiofrequency ablation for afib and atrial flutter were also discussed in detail today. These risks include but are not limited to stroke, bleeding, vascular damage, tamponade, perforation, damage to the esophagus, lungs, and other structures, pulmonary vein stenosis, worsening renal function, and death. The patient understands these risk and wishes to proceed.   She reports compliance with eliquis without interruption.  Cardiac CT reviewed with patient at length.  Hillis Range MD, [facility] 04/18/2020 7:13 AM

## 2020-04-18 NOTE — Transfer of Care (Signed)
Immediate Anesthesia Transfer of Care Note  Patient: Emily Maldonado  Procedure(s) Performed: ATRIAL FIBRILLATION ABLATION (N/A )  Patient Location: PACU  Anesthesia Type:General  Level of Consciousness: awake, alert  and oriented  Airway & Oxygen Therapy: Patient Spontanous Breathing and Patient connected to nasal cannula oxygen  Post-op Assessment: Report given to RN, Post -op Vital signs reviewed and stable and Patient moving all extremities X 4  Post vital signs: Reviewed and stable  Last Vitals:  Vitals Value Taken Time  BP 145/76 04/18/20 1008  Temp    Pulse 90 04/18/20 1009  Resp 13 04/18/20 1009  SpO2 98 % 04/18/20 1009  Vitals shown include unvalidated device data.  Last Pain:  Vitals:   04/18/20 0604  TempSrc:   PainSc: 0-No pain         Complications: No complications documented.

## 2020-04-18 NOTE — Anesthesia Procedure Notes (Signed)
Procedure Name: Intubation Date/Time: 04/18/2020 7:55 AM Performed by: Marena Chancy, CRNA Pre-anesthesia Checklist: Patient identified, Emergency Drugs available, Suction available and Patient being monitored Patient Re-evaluated:Patient Re-evaluated prior to induction Oxygen Delivery Method: Circle System Utilized Preoxygenation: Pre-oxygenation with 100% oxygen Induction Type: IV induction Ventilation: Mask ventilation without difficulty and Oral airway inserted - appropriate to patient size Laryngoscope Size: Hyacinth Meeker and 2 Grade View: Grade II Tube type: Oral Tube size: 7.0 mm Number of attempts: 3 Airway Equipment and Method: Stylet and Oral airway Placement Confirmation: ETT inserted through vocal cords under direct vision,  positive ETCO2 and breath sounds checked- equal and bilateral Tube secured with: Tape Dental Injury: Teeth and Oropharynx as per pre-operative assessment  Comments: Dl x 2 by crna unable to see vocal cords.  Anesthesiologist x 1 with miller 2 able to see cords.

## 2020-04-18 NOTE — Discharge Instructions (Signed)
Post procedure care instructions No driving for 4 days. No lifting over 5 lbs for 1 week. No vigorous or sexual activity for 1 week. You may return to work/your usual activities on 04/25/2020. Keep procedure site clean & dry. If you notice increased pain, swelling, bleeding or pus, call/return!  You may shower, but no soaking baths/hot tubs/pools for 1 week.    You have an appointment set up with the Atrial Fibrillation Clinic.  Multiple studies have shown that being followed by a dedicated atrial fibrillation clinic in addition to the standard care you receive from your other physicians improves health. We believe that enrollment in the atrial fibrillation clinic will allow [place] to better care for you.   The phone number to the Atrial Fibrillation Clinic is [phone]. The clinic is staffed Monday through Friday from 8:30am to 5pm.  Parking Directions: The clinic is located in the Heart and Vascular Building connected to [facility]. 1)From [address] turn on to [address] and go to the 3rd entrance  (Heart and Vascular entrance) on the right. 2)Look to the right for Heart &Vascular Parking Garage. 3)A code for the entrance is required, for October is 3009.   4)Take the elevators to the 1st floor. Registration is in the room with the glass walls at the end of the hallway.  If you have any trouble parking or locating the clinic, please don't hesitate to call [phone]. Cardiac Ablation, Care After  This sheet gives you information about how to care for yourself after your procedure. Your health care provider may also give you more specific instructions. If you have problems or questions, contact your health care provider. What can I expect after the procedure? After the procedure, it is common to have:  Bruising around your puncture site.  Tenderness around your puncture site.  Skipped heartbeats.  Tiredness (fatigue).  Follow these instructions at home: Puncture  site care   Follow instructions from your health care provider about how to take care of your puncture site. Make sure you: ? If present, leave stitches (sutures), skin glue, or adhesive strips in place. These skin closures may need to stay in place for up to 2 weeks. If adhesive strip edges start to loosen and curl up, you may trim the loose edges. Do not remove adhesive strips completely unless your health care provider tells you to do that.  Check your puncture site every day for signs of infection. Check for: ? Redness, swelling, or pain. ? Fluid or blood. If your puncture site starts to bleed, lie down on your back, apply firm pressure to the area, and contact your health care provider. ? Warmth. ? Pus or a bad smell. Driving  Do not drive for at least 4 days after your procedure or however long your health care provider recommends. (Do not resume driving if you have previously been instructed not to drive for other health reasons.)  Do not drive or use heavy machinery while taking prescription pain medicine. Activity  Avoid activities that take a lot of effort for at least 7 days after your procedure.  Do not lift anything that is heavier than 5 lb (4.5 kg) for one week.   No sexual activity for 1 week.   Return to your normal activities as told by your health care provider. Ask your health care provider what activities are safe for you. General instructions  Take over-the-counter and prescription medicines only as told by your health care provider.  Do not  use any products that contain nicotine or tobacco, such as cigarettes and e-cigarettes. If you need help quitting, ask your health care provider.  You may shower after 24 hours, but Do not take baths, swim, or use a hot tub for 1 week.   Do not drink alcohol for 24 hours after your procedure.  Keep all follow-up visits as told by your health care provider. This is important. Contact a health care provider if:  You have  redness, mild swelling, or pain around your puncture site.  You have fluid or blood coming from your puncture site that stops after applying firm pressure to the area.  Your puncture site feels warm to the touch.  You have pus or a bad smell coming from your puncture site.  You have a fever.  You have chest pain or discomfort that spreads to your neck, jaw, or arm.  You are sweating a lot.  You feel nauseous.  You have a fast or irregular heartbeat.  You have shortness of breath.  You are dizzy or light-headed and feel the need to lie down.  You have pain or numbness in the arm or leg closest to your puncture site. Get help right away if:  Your puncture site suddenly swells.  Your puncture site is bleeding and the bleeding does not stop after applying firm pressure to the area. These symptoms may represent a serious problem that is an emergency. Do not wait to see if the symptoms will go away. Get medical help right away. Call your local emergency services (911 in the [place] Do not drive yourself to the hospital. Summary  After the procedure, it is normal to have bruising and tenderness at the puncture site in your groin, neck, or forearm.  Check your puncture site every day for signs of infection.  Get help right away if your puncture site is bleeding and the bleeding does not stop after applying firm pressure to the area. This is a medical emergency. This information is not intended to replace advice given to you by your health care provider. Make sure you discuss any questions you have with your health care provider.

## 2020-04-18 NOTE — Progress Notes (Signed)
Spoke with EP PA Merita Norton, updated her in regards to BP/P, no further orders.

## 2020-04-19 ENCOUNTER — Ambulatory Visit (HOSPITAL_COMMUNITY)
Admission: RE | Admit: 2020-04-19 | Discharge: 2020-04-19 | Disposition: A | Discharge status: Home / Self Care | Payer: Commercial Managed Care - PPO | Source: Ambulatory Visit | Attending: Physician Assistant | Admitting: Physician Assistant

## 2020-04-19 VITALS — BP 142/80 | HR 98

## 2020-04-19 DIAGNOSIS — G4733 Obstructive sleep apnea (adult) (pediatric): Secondary | ICD-10-CM | POA: Insufficient documentation

## 2020-04-19 DIAGNOSIS — Z6841 Body Mass Index (BMI) 40.0 and over, adult: Secondary | ICD-10-CM | POA: Insufficient documentation

## 2020-04-19 DIAGNOSIS — I483 Typical atrial flutter: Secondary | ICD-10-CM

## 2020-04-19 DIAGNOSIS — Z7901 Long term (current) use of anticoagulants: Secondary | ICD-10-CM | POA: Insufficient documentation

## 2020-04-19 DIAGNOSIS — I480 Paroxysmal atrial fibrillation: Secondary | ICD-10-CM

## 2020-04-19 DIAGNOSIS — Z793 Long term (current) use of hormonal contraceptives: Secondary | ICD-10-CM | POA: Diagnosis not present

## 2020-04-19 DIAGNOSIS — F419 Anxiety disorder, unspecified: Secondary | ICD-10-CM | POA: Insufficient documentation

## 2020-04-19 DIAGNOSIS — Z79890 Hormone replacement therapy (postmenopausal): Secondary | ICD-10-CM | POA: Diagnosis not present

## 2020-04-19 DIAGNOSIS — I10 Essential (primary) hypertension: Secondary | ICD-10-CM | POA: Insufficient documentation

## 2020-04-19 DIAGNOSIS — E039 Hypothyroidism, unspecified: Secondary | ICD-10-CM | POA: Insufficient documentation

## 2020-04-19 DIAGNOSIS — F17200 Nicotine dependence, unspecified, uncomplicated: Secondary | ICD-10-CM | POA: Diagnosis not present

## 2020-04-19 DIAGNOSIS — Z79899 Other long term (current) drug therapy: Secondary | ICD-10-CM | POA: Insufficient documentation

## 2020-04-19 NOTE — Progress Notes (Signed)
Primary Care Physician: Ardith Dark, MD Primary Cardiologist: Dr Delton See Primary Electrophysiologist: Dr Johney Frame Referring Physician: [name]   Emily Maldonado is a 44 y.o. female with a history of hypothyroidism, tobacco abuse, HTN, OSA on CPAP, obesity, anxiety and paroxysmal fibrillation and atrial flutter who presents for follow up in the [facility]. Patient was diagnosed with afib on 11/16/18 after presenting to the ER with palpitations. ECG showed afib with RVR and she converted to SR with diltiazem. Patient has a CHADS2VASC score of 2. Patient woke with symptoms of heart racing on 09/20/19 and presented to the ER in rapid afib. She did take an extra dose of BB. She converted to SR in the ER without intervention. There were no triggers that she could identify. She denies alcohol use and is compliant with her CPAP. Patient began having palpitations and chest pain 11/07/19 while she was visiting family in [place]. She went to the ER in [facility] but left without definitive treatment. She presented to [facility] ER on 11/08/19 in atrial flutter with a heart rate of 170 bpm. Patient admits she stopped her flecainide on her own 2/2 side effects. Her magnesium was low at 1.6. She converted to SR during triage. She had one alcoholic drink prior to onset of symptoms.   Patient had sudden onset of rapid heart rates associated with SOB and chest discomfort which have been typical of her arrhythmias in the past. She took PRN Lopressor with no improvement in her symptoms. She was seen in our clinic and was in rapid atrial flutter. She was sent to the ED and underwent DCCV. She was referred to EP to consider ablation.  On follow up today, patient is s/p afib and atrial flutter ablation 04/18/20. She woke this AM with a feeling that her "throat was swollen." She also has some pleuritic discomfort with deep inspiration. She is not SOB. She denies any bleeding issues.   Today, she  denies symptoms of palpitations, chest pain, SOB, orthopnea, PND, lower extremity edema, dizziness, presyncope, syncope, bleeding, or neurologic sequela. The patient is tolerating medications without difficulties and is otherwise without complaint today.    Atrial Fibrillation Risk Factors:  she does have symptoms or diagnosis of sleep apnea. she is compliant with CPAP therapy. she does not have a history of rheumatic fever. she does not have a history of alcohol use. The patient does have a history of early familial atrial fibrillation or other arrhythmias. Most of her immediate family members have afib.   she has a BMI of There is no height or weight on file to calculate BMI.. There were no vitals filed for this visit.  Family History  Problem Relation Age of Onset  . Breast cancer Mother 52  . Heart failure Mother   . Atrial fibrillation Mother   . Cervical cancer Mother   . Diabetes Mother   . Hypertension Mother   . Heart failure Father   . Hypertension Father   . Heart failure Maternal Grandmother   . Atrial fibrillation Maternal Grandmother   . Stroke Maternal Grandmother   . Uterine cancer Maternal Grandmother   . Diabetes Brother   . Hypertension Brother   . Uterine cancer Maternal Aunt      Atrial Fibrillation Management history:  Previous antiarrhythmic drugs: flecainide, Multaq Previous cardioversions: 03/08/20 Previous ablations: 04/18/20 CHADS2VASC score: 2 Anticoagulation history: Eliquis   Past Medical History:  Diagnosis Date  . A-fib (HCC)   .  Abnormal Pap smear of cervix   . Anemia    b12  . Anxiety   . B12 deficiency anemia   . Childhood asthma   . Former tobacco use   . Hypertension   . Hypothyroidism   . Migraines    aura  . Morbid obesity (HCC)   . Ovarian cyst   . Sleep apnea    Use C-pap    Past Surgical History:  Procedure Laterality Date  . ATRIAL FIBRILLATION ABLATION N/A 04/18/2020   Procedure: ATRIAL FIBRILLATION ABLATION;   Surgeon: Hillis RangeAllred, James, MD;  Location: [facility] INVASIVE CV LAB;  Service: Cardiovascular;  Laterality: N/A;  . BREAST BIOPSY Left 2014  . BREAST EXCISIONAL BIOPSY Right   . CERVICAL BIOPSY  W/ LOOP ELECTRODE EXCISION    . LEFT HEART CATH AND CORONARY ANGIOGRAPHY N/A 12/04/2017   Procedure: LEFT HEART CATH AND CORONARY ANGIOGRAPHY;  Surgeon: Kathleene HazelMcAlhany, Christopher D, MD;  Location: [facility] INVASIVE CV LAB;  Service: Cardiovascular;  Laterality: N/A;  . TONSILLECTOMY    . TUBAL LIGATION      Current Outpatient Medications  Medication Sig Dispense Refill  . apixaban (ELIQUIS) 5 MG TABS tablet Take 1 tablet (5 mg total) by mouth 2 (two) times daily. 60 tablet 11  . CHANTIX 1 MG tablet TAKE 1 TABLET BY MOUTH TWICE A DAY (Patient taking differently: Take 1 mg by mouth daily. ) 180 tablet 1  . Cholecalciferol (VITAMIN D3) 250 MCG (10000 UT) capsule Take 10,000 Units by mouth daily.    . cyanocobalamin (,VITAMIN B-12,) 1000 MCG/ML injection INJECT 1 ML INTO MUSCLE DAILY FOR 6 DAYS THEN WEEKLY FOR 1 MONTH THEN MONTHLY THEN 1 ML MONTHLY (Patient taking differently: Inject into the skin every 14 (fourteen) days. ) 11 mL 1  . FERROUS SULFATE PO Take 220 mg by mouth daily.     [name]. levothyroxine (SYNTHROID) 112 MCG tablet TAKE 1 TABLET EVERY DAY ON AN EMPTY STOMACH (Patient taking differently: Take 112 mcg by mouth daily before breakfast. ) 90 tablet 1  . magnesium oxide (MAG-OX) 400 MG tablet Take 400 mg by mouth 2 (two) times daily.     . medroxyPROGESTERone (DEPO-PROVERA) 150 MG/ML injection Inject 1 mL (150 mg total) into the muscle once for 1 dose. 1 mL 2  . metoprolol succinate (TOPROL-XL) 100 MG 24 hr tablet Take 1 tablet (100 mg total) by mouth daily. Take with or immediately following a meal. 90 tablet 1  . metoprolol tartrate (LOPRESSOR) 25 MG tablet TAKE 1 TABLET EVERY 6 HOURS AS NEEDED FOR HR >100 (Patient taking differently: Take 25 mg by mouth every 6 (six) hours as needed (HR>100). ) 360 tablet 1  . MULTAQ  400 MG tablet TAKE 1 TABLET BY MOUTH TWICE A DAY WITH A MEAL (Patient taking differently: Take 400 mg by mouth 2 (two) times daily with a meal. ) 180 tablet 1  . omeprazole (PRILOSEC) 40 MG capsule Take 1 capsule (40 mg total) by mouth 2 (two) times daily. (Patient taking differently: Take 40 mg by mouth daily. ) 60 capsule 3  . pantoprazole (PROTONIX) 40 MG tablet Take 1 tablet (40 mg total) by mouth daily. 45 tablet 0  . Potassium 99 MG TABS Take 99 mg by mouth daily.     . SYRINGE-NEEDLE, DISP, 3 ML 22G X 1-1/2" 3 ML MISC Use daily as directed. 50 each 0  . VITAMIN D PO Take 10,000 Units by mouth daily.  (Patient not taking: Reported on 04/12/2020)  No current facility-administered medications for this encounter.    Allergies  Allergen Reactions  . Penicillins Anaphylaxis    Has patient had a PCN reaction causing immediate rash, facial/tongue/throat swelling, SOB or lightheadedness with hypotension: /No Has patient had a PCN reaction causing severe rash involving mucus membranes or skin necrosis: No Has patient had a PCN reaction that required hospitalization: No Has patient had a PCN reaction occurring within the last 10 years: No If all of the above answers are "NO", then may proceed with Cephalosporin use.   . Shellfish Allergy Anaphylaxis  . Zithromax [Azithromycin] Rash    Social History   Socioeconomic History  . Marital status: Single    Spouse name: Not on file  . Number of children: Not on file  . Years of education: Not on file  . Highest education level: Not on file  Occupational History  . Not on file  Tobacco Use  . Smoking status: Current Every Day Smoker    Packs/day: 1.00    Types: Cigarettes  . Smokeless tobacco: Never Used  . Tobacco comment: 3/4 of pack  Vaping Use  . Vaping Use: Never used  Substance and Sexual Activity  . Alcohol use: Not Currently    Alcohol/week: 1.0 standard drink    Types: 1 Standard drinks or equivalent per week  . Drug use:  No  . Sexual activity: Yes    Partners: Male    Birth control/protection: Surgical    Comment: btl  Other Topics Concern  . Not on file  Social History Narrative  . Not on file   Social Determinants of Health   Financial Resource Strain:   . Difficulty of Paying Living Expenses: Not on file  Food Insecurity:   . Worried About Programme researcher, broadcasting/film/video in the Last Year: Not on file  . Ran Out of Food in the Last Year: Not on file  Transportation Needs:   . Lack of Transportation (Medical): Not on file  . Lack of Transportation (Non-Medical): Not on file  Physical Activity:   . Days of Exercise per Week: Not on file  . Minutes of Exercise per Session: Not on file  Stress:   . Feeling of Stress : Not on file  Social Connections:   . Frequency of Communication with Friends and Family: Not on file  . Frequency of Social Gatherings with Friends and Family: Not on file  . Attends Religious Services: Not on file  . Active Member of Clubs or Organizations: Not on file  . Attends Banker Meetings: Not on file  . Marital Status: Not on file  Intimate Partner Violence:   . Fear of Current or Ex-Partner: Not on file  . Emotionally Abused: Not on file  . Physically Abused: Not on file  . Sexually Abused: Not on file     ROS- All systems are reviewed and negative except as per the HPI above.  Physical Exam: Vitals:   04/19/20 1053  BP: (!) 142/80  Pulse: 98  SpO2: 96%    GEN- The patient is well appearing obese female, alert and oriented x 3 today.   HEENT-head normocephalic, atraumatic, sclera clear, conjunctiva pink, hearing intact, trachea midline. Lungs- Clear to ausculation bilaterally, normal work of breathing, no strider or wheezing.  Heart- Regular rate and rhythm, no murmurs, rubs or gallops  GI- soft, NT, ND, + BS Extremities- no clubbing, cyanosis, or edema MS- no significant deformity or atrophy Skin- no rash or lesion Psych-  euthymic mood, full  affect Neuro- strength and sensation are intact   Wt Readings from Last 3 Encounters:  04/18/20 127 kg  03/09/20 127.5 kg  03/08/20 125.8 kg    EKG is not performed today.  Echo 12/05/17 demonstrated  - Left ventricle: The cavity size was normal. Systolic function was normal. The estimated ejection fraction was in the range of 55% to 60%. Wall motion was normal; there were no regional wall motion abnormalities. Left ventricular diastolic function parameters were normal. - Left atrium: The atrium was moderately dilated. - Atrial septum: No defect or patent foramen ovale was identified.  LA 59mm  LHC 12/04/17 1. No angiographic evidence of CAD 2. Mild elevation LV filling pressures 3. Non-cardiac chest pain  Epic records are reviewed at length today  CHA2DS2-VASc Score = 2 The patient's score is based upon: CHF History: No HTN History: Yes Age : < 65 Diabetes History: No Stroke History: No Vascular Disease History: No Gender: Female   ASSESSMENT AND PLAN: 1. Paroxysmal Atrial Fibrillation/typical atrial flutter The patient's CHA2DS2-VASc score is 2, indicating a 2.2% annual risk of stroke. S/p afib and flutter ablation 04/18/20 Patient's rhythm is regular today. Throat and chest discomfort appear consistent with normal postop course. Recommended lozenges and good hydration. If pleuritic pain persists, could consider colchicine.  Continue Multaq 400 mg BID. Continue Toprol 100 mg daily. Continue Lopressor 25 mg PRN q 6hours for heart racing.  2. HTN Stable, no changes today.   Follow up in the AF clinic next week.    Jorja Loa PA-C [facility] [address] [place], [place] [address] [phone] 04/19/2020 11:21 AM

## 2020-04-19 NOTE — Progress Notes (Signed)
Client states " I had lots of problems and I went to the A-fib clinic"

## 2020-04-19 NOTE — Progress Notes (Signed)
Same Day PCI D/C Phone Call   [x]   1st call         [x]   Answer.         []   No Answer    []   2nd call        []   Answer       []   No Answer   Is there bleeding or other abnormality from access site?  []   Yes    []   No  Has there been any chest pain, nausea or vomiting?    []   No  []   Yes chest pain   []   Yes nausea or vomiting  Have you sought any medical attention since discharge?    [x]   Yes  []   No  Remind Patient about their follow up appointment, review discharge & medication instructions.   [x]   Yes  []   No    Would you say you were satisfied with you Same Day Discharge?  (Disagree, Neutral, Agree)   []   Disagree, why  []   Neutral  [x]   Agree

## 2020-04-20 NOTE — Anesthesia Postprocedure Evaluation (Signed)
Anesthesia Post Note  Patient: Emily Maldonado  Procedure(s) Performed: ATRIAL FIBRILLATION ABLATION (N/A )     Patient location during evaluation: PACU Anesthesia Type: General Level of consciousness: awake and alert Pain management: pain level controlled Vital Signs Assessment: post-procedure vital signs reviewed and stable Respiratory status: spontaneous breathing, nonlabored ventilation, respiratory function stable and patient connected to nasal cannula oxygen Cardiovascular status: blood pressure returned to baseline and stable Postop Assessment: no apparent nausea or vomiting Anesthetic complications: no   No complications documented.  Last Vitals:  Vitals:   04/18/20 1200 04/18/20 1343  BP: (!) 171/91 (!) 174/93  Pulse: 88 93  Resp: 20 18  Temp:    SpO2: 96% 96%    Last Pain:  Vitals:   04/18/20 1124  TempSrc:   PainSc: 5                  Kennieth Rad

## 2020-04-24 ENCOUNTER — Ambulatory Visit (HOSPITAL_COMMUNITY): Payer: Commercial Managed Care - PPO | Admitting: Physician Assistant

## 2020-05-16 ENCOUNTER — Ambulatory Visit (HOSPITAL_COMMUNITY): Payer: Commercial Managed Care - PPO | Admitting: Physician Assistant

## 2020-05-16 ENCOUNTER — Encounter: Payer: Self-pay | Admitting: Obstetrics & Gynecology

## 2020-05-16 ENCOUNTER — Other Ambulatory Visit: Payer: Self-pay | Admitting: Family Medicine

## 2020-05-18 NOTE — Progress Notes (Addendum)
Primary Care Physician: Ardith DarkParker, Caleb M, MD Primary Cardiologist: Dr Delton SeeNelson Primary Electrophysiologist: Dr Johney FrameAllred Referring Physician: [facility] [facility]   Emily Maldonado is a 44 y.o. female with a history of hypothyroidism, tobacco abuse, HTN, OSA on CPAP, obesity, anxiety and paroxysmal fibrillation and atrial flutter who presents for follow up in the [facility]. Patient was diagnosed with afib on 11/16/18 after presenting to the ER with palpitations. ECG showed afib with RVR and she converted to SR with diltiazem. Patient has a CHADS2VASC score of 2. Patient woke with symptoms of heart racing on 09/20/19 and presented to the ER in rapid afib. She did take an extra dose of BB. She converted to SR in the ER without intervention. There were no triggers that she could identify. She denies alcohol use and is compliant with her CPAP. Patient began having palpitations and chest pain 11/07/19 while [place] was [place]. [facility] but left without definitive treatment. [facility]19 in atrial flutter with a heart rate of 170 bpm. Patient admits she stopped her flecainide on her own 2/2 side effects. Her magnesium was low at 1.6. She converted to SR during triage. She had one alcoholic drink prior to onset of symptoms.   Patient had sudden onset of rapid heart rates associated with SOB and chest discomfort which have been typical of her arrhythmias in the past. She took PRN Lopressor with no improvement in her symptoms. She was seen in our clinic and was in rapid atrial flutter. She was sent to the ED and underwent DCCV. She was referred to EP to consider ablation. Patient is s/p afib and atrial flutter ablation 04/18/20.   On follow up today, patient reports she has done well from a cardiac standpoint. She did have frequent episodes of heart racing for the first 2 weeks after ablation but this has resolved. She also notes the  episodes were not nearly as symptomatic as before the ablation. Her throat discomfort has resolved. She has been under considerable stress with the passing of her step-father and mother.   Today, she denies symptoms of palpitations, chest pain, SOB, orthopnea, PND, lower extremity edema, dizziness, presyncope, syncope, bleeding, or neurologic sequela. The patient is tolerating medications without difficulties and is otherwise without complaint today.    Atrial Fibrillation Risk Factors:  she does have symptoms or diagnosis of sleep apnea. she is compliant with CPAP therapy. she does not have a history of rheumatic fever. she does not have a history of alcohol use. The patient does have a history of early familial atrial fibrillation or other arrhythmias. Most of her immediate family members have afib.   she has a BMI of Body mass index is 45.66 kg/m.[name]. Filed Weights   05/19/20 0836  Weight: 124.5 kg    Family History  Problem Relation Age of Onset  . Breast cancer Mother 1247  . Heart failure Mother   . Atrial fibrillation Mother   . Cervical cancer Mother   . Diabetes Mother   . Hypertension Mother   . Heart failure Father   . Hypertension Father   . Heart failure Maternal Grandmother   . Atrial fibrillation Maternal Grandmother   . Stroke Maternal Grandmother   . Uterine cancer Maternal Grandmother   . Diabetes Brother   . Hypertension Brother   . Uterine cancer Maternal Aunt      Atrial Fibrillation Management history:  Previous antiarrhythmic drugs: flecainide, Multaq Previous cardioversions: 03/08/20 Previous ablations: 04/18/20 CHADS2VASC score: 2 Anticoagulation history: Eliquis   Past Medical History:  Diagnosis Date  . A-fib (HCC)   . Abnormal Pap smear of cervix   . Anemia    b12  . Anxiety   . B12 deficiency anemia   . Childhood asthma   . Former tobacco use   . Hypertension   . Hypothyroidism   . Migraines    aura  . Morbid obesity (HCC)   .  Ovarian cyst   . Sleep apnea    Use C-pap    Past Surgical History:  Procedure Laterality Date  . ATRIAL FIBRILLATION ABLATION N/A 04/18/2020   Procedure: ATRIAL FIBRILLATION ABLATION;  Surgeon: Hillis Range, MD;  [facility];  Service: Cardiovascular;  Laterality: N/A;  . BREAST BIOPSY Left 2014  . BREAST EXCISIONAL BIOPSY Right   . CERVICAL BIOPSY  W/ LOOP ELECTRODE EXCISION    . LEFT HEART CATH AND CORONARY ANGIOGRAPHY N/A 12/04/2017   Procedure: LEFT HEART CATH AND CORONARY ANGIOGRAPHY;  Surgeon: Kathleene Hazel, MD;  Location: [facility];  Service: Cardiovascular;  Laterality: N/A;  . TONSILLECTOMY    . TUBAL LIGATION      Current Outpatient Medications  Medication Sig Dispense Refill  . apixaban (ELIQUIS) 5 MG TABS tablet Take 1 tablet (5 mg total) by mouth 2 (two) times daily. 60 tablet 11  . CHANTIX 1 MG tablet TAKE 1 TABLET BY MOUTH TWICE A DAY (Patient taking differently: Take 1 mg by mouth daily. ) 180 tablet 1  . Cholecalciferol (VITAMIN D3) 250 MCG (10000 UT) capsule Take 10,000 Units by mouth daily.    . cyanocobalamin (,VITAMIN B-12,) 1000 MCG/ML injection INJECT 1 ML INTO MUSCLE DAILY FOR 6 DAYS THEN WEEKLY FOR 1 MONTH THEN MONTHLY THEN 1 ML MONTHLY 11 mL 1  . FERROUS SULFATE PO Take 220 mg by mouth daily.     [name] levothyroxine (SYNTHROID) 112 MCG tablet TAKE 1 TABLET EVERY DAY ON AN EMPTY STOMACH 90 tablet 1  . magnesium oxide (MAG-OX) 400 MG tablet Take 400 mg by mouth 2 (two) times daily.     . medroxyPROGESTERone (DEPO-PROVERA) 150 MG/ML injection Inject 1 mL (150 mg total) into the muscle once for 1 dose. 1 mL 2  . metoprolol succinate (TOPROL-XL) 100 MG 24 hr tablet TAKE 1 TABLET BY MOUTH EVERY DAY WITH OR IMMEDIATELY FOLLOWING A MEAL 90 tablet 1  . metoprolol tartrate (LOPRESSOR) 25 MG tablet TAKE 1 TABLET EVERY 6 HOURS AS NEEDED FOR HR >100 360 tablet 1  . MULTAQ 400 MG tablet TAKE 1 TABLET BY MOUTH TWICE A DAY WITH A MEAL 180 tablet  1  . omeprazole (PRILOSEC) 40 MG capsule Take 1 capsule (40 mg total) by mouth 2 (two) times daily. 60 capsule 3  . pantoprazole (PROTONIX) 40 MG tablet Take 1 tablet (40 mg total) by mouth daily. 45 tablet 0  . Potassium 99 MG TABS Take 99 mg by mouth daily.     . SYRINGE-NEEDLE, DISP, 3 ML 22G X 1-1/2" 3 ML MISC Use daily as directed. 50 each 0  . VITAMIN D PO Take 10,000 Units by mouth daily.      No current facility-administered medications for this encounter.    Allergies  Allergen Reactions  . Penicillins Anaphylaxis    Has patient had a PCN reaction causing immediate rash, facial/tongue/throat swelling, SOB or lightheadedness with hypotension: /No Has patient  had a PCN reaction causing severe rash involving mucus membranes or skin necrosis: No Has patient had a PCN reaction that required hospitalization: No Has patient had a PCN reaction occurring within the last 10 years: No If all of the above answers are "NO", then may proceed with Cephalosporin use.   . Shellfish Allergy Anaphylaxis  . Zithromax [Azithromycin] Rash    Social History   Socioeconomic History  . Marital status: Single    Spouse name: Not on file  . Number of children: Not on file  . Years of education: Not on file  . Highest education level: Not on file  Occupational History  . Not on file  Tobacco Use  . Smoking status: Current Every Day Smoker    Packs/day: 1.00    Types: Cigarettes  . Smokeless tobacco: Never Used  . Tobacco comment: 3/4 of pack  Vaping Use  . Vaping Use: Never used  Substance and Sexual Activity  . Alcohol use: Not Currently    Alcohol/week: 1.0 standard drink    Types: 1 Standard drinks or equivalent per week  . Drug use: No  . Sexual activity: Yes    Partners: Male    Birth control/protection: Surgical    Comment: btl  Other Topics Concern  . Not on file  Social History Narrative  . Not on file   Social Determinants of Health   Financial Resource Strain:   .  Difficulty of Paying Living Expenses: Not on file  Food Insecurity:   . Worried About Programme researcher, broadcasting/film/video in the Last Year: Not on file  . Ran Out of Food in the Last Year: Not on file  Transportation Needs:   . Lack of Transportation (Medical): Not on file  . Lack of Transportation (Non-Medical): Not on file  Physical Activity:   . Days of Exercise per Week: Not on file  . Minutes of Exercise per Session: Not on file  Stress:   . Feeling of Stress : Not on file  Social Connections:   . Frequency of Communication with Friends and Family: Not on file  . Frequency of Social Gatherings with Friends and Family: Not on file  . Attends Religious Services: Not on file  . Active Member of Clubs or Organizations: Not on file  . Attends Banker Meetings: Not on file  . Marital Status: Not on file  Intimate Partner Violence:   . Fear of Current or Ex-Partner: Not on file  . Emotionally Abused: Not on file  . Physically Abused: Not on file  . Sexually Abused: Not on file     ROS- All systems are reviewed and negative except as per the HPI above.  Physical Exam: Vitals:   05/19/20 0836  BP: 140/82  Pulse: 85  Weight: 124.5 kg  Height: 5\' 5"  (1.651 m)    GEN- The patient is well appearing obese female, alert and oriented x 3 today.   HEENT-head normocephalic, atraumatic, sclera clear, conjunctiva pink, hearing intact, trachea midline. Lungs- Clear to ausculation bilaterally, normal work of breathing Heart- Regular rate and rhythm, no murmurs, rubs or gallops  GI- soft, NT, ND, + BS Extremities- no clubbing, cyanosis, or edema MS- no significant deformity or atrophy Skin- no rash or lesion Psych- euthymic mood, full affect Neuro- strength and sensation are intact   Wt Readings from Last 3 Encounters:  05/19/20 124.5 kg  04/18/20 127 kg  03/09/20 127.5 kg    EKG today demonstrates SR HR 85,  NST, PR 138, QRS 86, QTc 452  Echo 12/05/17 demonstrated  - Left  ventricle: The cavity size was normal. Systolic function was normal. The estimated ejection fraction was in the range of 55% to 60%. Wall motion was normal; there were no regional wall motion abnormalities. Left ventricular diastolic function parameters were normal. - Left atrium: The atrium was moderately dilated. - Atrial septum: No defect or patent foramen ovale was identified.  LA 69mm  LHC 12/04/17 1. No angiographic evidence of CAD 2. Mild elevation LV filling pressures 3. Non-cardiac chest pain  Epic records are reviewed at length today  CHA2DS2-VASc Score = 2 The patient's score is based upon: CHF History: No HTN History: Yes Age : < 65 Diabetes History: No Stroke History: No Vascular Disease History: No Gender: Female   ASSESSMENT AND PLAN: 1. Paroxysmal Atrial Fibrillation/typical atrial flutter The patient's CHA2DS2-VASc score is 2, indicating a 2.2% annual risk of stroke. S/p afib and flutter ablation 04/18/20 Patient appears to be maintaining SR now. Continue Multaq 400 mg BID. Continue Toprol 100 mg daily. Continue Lopressor 25 mg PRN q 6hours for heart racing. Continue Eliquis 5 mg BID with no missed doses for at least 3 months post ablation.   2. HTN Stable, no changes today.   Follow up with Dr Johney Frame as scheduled.    Jorja Loa PA-C [facility] [address] [place], [place] [address] [phone] 05/19/2020 8:46 AM

## 2020-05-19 ENCOUNTER — Other Ambulatory Visit (HOSPITAL_COMMUNITY): Payer: Self-pay | Admitting: Physician Assistant

## 2020-05-19 ENCOUNTER — Other Ambulatory Visit: Payer: Self-pay

## 2020-05-19 ENCOUNTER — Encounter (HOSPITAL_COMMUNITY): Payer: Self-pay | Admitting: Physician Assistant

## 2020-05-19 ENCOUNTER — Ambulatory Visit (HOSPITAL_COMMUNITY)
Admission: RE | Admit: 2020-05-19 | Discharge: 2020-05-19 | Disposition: A | Discharge status: Home / Self Care | Payer: Commercial Managed Care - PPO | Source: Ambulatory Visit | Attending: Physician Assistant | Admitting: Physician Assistant

## 2020-05-19 VITALS — BP 140/82 | HR 85 | Ht 65.0 in | Wt 274.4 lb

## 2020-05-19 DIAGNOSIS — I483 Typical atrial flutter: Secondary | ICD-10-CM | POA: Diagnosis not present

## 2020-05-19 DIAGNOSIS — Z7901 Long term (current) use of anticoagulants: Secondary | ICD-10-CM | POA: Diagnosis not present

## 2020-05-19 DIAGNOSIS — I480 Paroxysmal atrial fibrillation: Secondary | ICD-10-CM

## 2020-05-19 DIAGNOSIS — I10 Essential (primary) hypertension: Secondary | ICD-10-CM | POA: Insufficient documentation

## 2020-05-19 DIAGNOSIS — F17210 Nicotine dependence, cigarettes, uncomplicated: Secondary | ICD-10-CM | POA: Diagnosis not present

## 2020-05-19 DIAGNOSIS — Z6841 Body Mass Index (BMI) 40.0 and over, adult: Secondary | ICD-10-CM | POA: Diagnosis not present

## 2020-05-22 ENCOUNTER — Other Ambulatory Visit: Payer: Self-pay

## 2020-05-22 ENCOUNTER — Encounter (HOSPITAL_COMMUNITY): Payer: Self-pay

## 2020-05-22 ENCOUNTER — Ambulatory Visit (HOSPITAL_COMMUNITY)
Admission: EM | Admit: 2020-05-22 | Discharge: 2020-05-22 | Disposition: A | Discharge status: Home / Self Care | Payer: Commercial Managed Care - PPO | Attending: Family Medicine | Admitting: Family Medicine

## 2020-05-22 DIAGNOSIS — L0291 Cutaneous abscess, unspecified: Secondary | ICD-10-CM

## 2020-05-22 MED ORDER — SULFAMETHOXAZOLE-TRIMETHOPRIM 800-160 MG PO TABS: 1.0000 | ORAL_TABLET | Freq: Two times a day (BID) | ORAL | 0 refills | Status: AC

## 2020-05-22 MED ORDER — LIDOCAINE-EPINEPHRINE 1 %-1:100000 IJ SOLN
INTRAMUSCULAR | Status: AC
  Filled 2020-05-22: qty 1

## 2020-05-22 MED ORDER — TRAMADOL HCL 50 MG PO TABS: 50.0000 mg | ORAL_TABLET | Freq: Four times a day (QID) | ORAL | 0 refills | Status: DC | PRN

## 2020-05-22 NOTE — Discharge Instructions (Addendum)
Take the sulfamethoxazole/TMP 2 x a day Take tramadol as needed for pain Change dressing I-2 x a day Warm compresses may help

## 2020-05-22 NOTE — ED Provider Notes (Signed)
[facility]    CSN: [id number] Arrival date & time: 05/22/20  1928      History   Chief Complaint Chief Complaint  Patient presents with  . Abscess    left side of back    HPI Emily Maldonado is a 44 y.o. female.   HPI  Abscess left side of back Pain ful and growing Wants I and D Works as Charity fundraiserN Has had no fever husband drew circle around the infection an dit is larger throughout the day Patient has a history of heart disease.  She is on chronic anticoagulation.  She is a smoker.  She works as an Charity fundraiserN.  She states that she thinks her health is good  Past Medical History:  Diagnosis Date  . A-fib (HCC)   . Abnormal Pap smear of cervix   . Anemia    b12  . Anxiety   . B12 deficiency anemia   . Childhood asthma   . Former tobacco use   . Hypertension   . Hypothyroidism   . Migraines    aura  . Morbid obesity (HCC)   . Ovarian cyst   . Sleep apnea    Use C-pap     Patient Active Problem List   Diagnosis Date Noted  . Typical atrial flutter (HCC) 03/08/2020  . GERD (gastroesophageal reflux disease) 10/19/2019  . Low vitamin D level 01/18/2019  . Nicotine dependence with current use 11/17/2018  . Paroxysmal atrial fibrillation (HCC) 11/16/2018  . 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 Pulmonary nodule - needs repeat 12/2018 12/19/2017  . Low vitamin B12 level 12/05/2017  . PVC's (premature ventricular contractions)   . Morbid obesity (HCC)   . Acne 08/22/2017  . Hypothyroidism 08/22/2017  . Hypertension 08/22/2017    Past Surgical History:  Procedure Laterality Date  . ATRIAL FIBRILLATION ABLATION N/A 04/18/2020   Procedure: ATRIAL FIBRILLATION ABLATION;  Surgeon: Hillis RangeAllred, James, MD;  [facility];  Service: Cardiovascular;  Laterality: N/A;  . BREAST BIOPSY Left 2014  . BREAST EXCISIONAL BIOPSY Right   . CERVICAL BIOPSY  W/ LOOP ELECTRODE EXCISION    . LEFT HEART CATH AND CORONARY ANGIOGRAPHY N/A 12/04/2017   Procedure:  LEFT HEART CATH AND CORONARY ANGIOGRAPHY;  Surgeon: Kathleene HazelMcAlhany, Christopher D, MD;  [facility]: [facility];  Service: Cardiovascular;  Laterality: N/A;  . TONSILLECTOMY    . TUBAL LIGATION      OB History    Gravida  5   Para      Term      Preterm      AB  2   Living  3     SAB  1   TAB  1   Ectopic      Multiple      Live Births  3            Home Medications    Prior to Admission medications   Medication Sig Start Date End Date Taking? Authorizing Provider  apixaban (ELIQUIS) 5 MG TABS tablet Take 1 tablet (5 mg total) by mouth 2 (two) times daily. 03/21/20   Allred, Fayrene FearingJames, MD  CHANTIX 1 MG tablet TAKE 1 TABLET BY MOUTH TWICE A DAY Patient taking differently: Take 1 mg by mouth daily.  01/17/20   Ardith DarkParker, Caleb M, MD  Cholecalciferol (VITAMIN D3) 250 MCG (10000 UT) capsule Take 10,000 Units by mouth daily.    [provider]  cyanocobalamin (,VITAMIN B-12,) 1000 MCG/ML  injection INJECT 1 ML INTO MUSCLE DAILY FOR 6 DAYS THEN WEEKLY FOR 1 MONTH THEN MONTHLY THEN 1 ML MONTHLY 12/27/19   Ardith Dark, MD  FERROUS SULFATE PO Take 220 mg by mouth daily.     [provider]  levothyroxine (SYNTHROID) 112 MCG tablet TAKE 1 TABLET EVERY DAY ON AN EMPTY STOMACH 05/16/20   Ardith Dark, MD  magnesium oxide (MAG-OX) 400 MG tablet Take 400 mg by mouth 2 (two) times daily.     [provider]  medroxyPROGESTERone (DEPO-PROVERA) 150 MG/ML injection Inject 1 mL (150 mg total) into the muscle once for 1 dose. 03/27/20 05/19/20  Ardith Dark, MD  metoprolol succinate (TOPROL-XL) 100 MG 24 hr tablet TAKE 1 TABLET BY MOUTH EVERY DAY WITH OR IMMEDIATELY FOLLOWING A MEAL 05/19/20   Fenton, Clint R, PA  metoprolol tartrate (LOPRESSOR) 25 MG tablet TAKE 1 TABLET EVERY 6 HOURS AS NEEDED FOR HR >100 01/17/20   Fenton, Clint R, PA  MULTAQ 400 MG tablet TAKE 1 TABLET BY MOUTH TWICE A DAY WITH A MEAL 02/02/20   Fenton, Clint R, PA  omeprazole (PRILOSEC) 40 MG  capsule Take 1 capsule (40 mg total) by mouth 2 (two) times daily. 03/27/20   Ardith Dark, MD  pantoprazole (PROTONIX) 40 MG tablet Take 1 tablet (40 mg total) by mouth daily. 04/18/20 06/02/20  Allred, Fayrene Fearing, MD  Potassium 99 MG TABS Take 99 mg by mouth daily.     [provider]  sulfamethoxazole-trimethoprim (BACTRIM DS) 800-160 MG tablet Take 1 tablet by mouth 2 (two) times daily for 7 days. 05/22/20 05/29/20  Eustace Moore, MD  SYRINGE-NEEDLE, DISP, 3 ML 22G X 1-1/2" 3 ML MISC Use daily as directed. 05/12/19   Ardith Dark, MD  traMADol (ULTRAM) 50 MG tablet Take 1 tablet (50 mg total) by mouth every 6 (six) hours as needed. 05/22/20   Eustace Moore, MD  VITAMIN D PO Take 10,000 Units by mouth daily.     [provider]  flecainide (TAMBOCOR) 50 MG tablet TAKE 1 TABLET BY MOUTH TWICE A DAY Patient not taking: Reported on 11/08/2019 10/20/19 11/08/19  Danice Goltz, PA    Family History Family History  Problem Relation Age of Onset  . Breast cancer Mother 70  . Heart failure Mother   . Atrial fibrillation Mother   . Cervical cancer Mother   . Diabetes Mother   . Hypertension Mother   . Heart failure Father   . Hypertension Father   . Heart failure Maternal Grandmother   . Atrial fibrillation Maternal Grandmother   . Stroke Maternal Grandmother   . Uterine cancer Maternal Grandmother   . Diabetes Brother   . Hypertension Brother   . Uterine cancer Maternal Aunt     Social History Social History   Tobacco Use  . Smoking status: Current Every Day Smoker    Packs/day: 1.00    Types: Cigarettes  . Smokeless tobacco: Never Used  . Tobacco comment: 3/4 of pack  Vaping Use  . Vaping Use: Never used  Substance Use Topics  . Alcohol use: Not Currently    Alcohol/week: 1.0 standard drink    Types: 1 Standard drinks or equivalent per week  . Drug use: No     Allergies   Penicillins, Shellfish allergy, and Zithromax [azithromycin]   Review of  Systems Review of Systems See HPI  Physical Exam Triage Vital Signs ED Triage Vitals  Enc Vitals Group  BP 05/22/20 2018 (!) 155/92     Pulse Rate 05/22/20 2018 85     Resp 05/22/20 2018 16     Temp 05/22/20 2018 98.8 F (37.1 C)     Temp Source 05/22/20 2018 Oral     SpO2 05/22/20 2018 98 %     Weight --      Height --      Head Circumference --      Peak Flow --      Pain Score 05/22/20 2015 7     Pain Loc --      Pain Edu? --      Excl. in GC? --    No data found.  Updated Vital Signs BP (!) 155/92 (BP Location: Left Arm)   Pulse 85   Temp 98.8 F (37.1 C) (Oral)   Resp 16   SpO2 98%       Physical Exam Constitutional:      General: She is not in acute distress.    Appearance: She is well-developed. She is obese.     Comments: Pleasant  HENT:     Head: Normocephalic and atraumatic.     Mouth/Throat:     Comments: Mask is in place Eyes:     Conjunctiva/sclera: Conjunctivae normal.     Pupils: Pupils are equal, round, and reactive to light.  Cardiovascular:     Rate and Rhythm: Normal rate.  Pulmonary:     Effort: Pulmonary effort is normal. No respiratory distress.  Abdominal:     Palpations: Abdomen is soft.  Musculoskeletal:        General: Normal range of motion.     Cervical back: Normal range of motion.  Skin:    General: Skin is warm and dry.          Comments: 5 cm of erythema with induration.  Central darker 1 cm lesion.  The area is anesthetized with 1 cc of 1% lidocaine.  Then used an 18-gauge to confirm that there was purulence.  I&D then performed  Neurological:     Mental Status: She is alert.  Psychiatric:        Behavior: Behavior normal.      UC Treatments / Results  Labs (all labs ordered are listed, but only abnormal results are displayed) Labs Reviewed - No data to display  EKG   Radiology No results found.  Procedures Incision and Drainage  Date/Time: 05/22/2020 8:58 PM Performed by: Eustace Moore,  MD Authorized by: Eustace Moore, MD   Consent:    Consent obtained:  Verbal   Consent given by:  Patient   Risks discussed:  Incomplete drainage and pain   Alternatives discussed:  No treatment Location:    Type:  Abscess   Location:  Trunk   Trunk location:  Back Pre-procedure details:    Skin preparation:  Betadine Anesthesia (see MAR for exact dosages):    Anesthesia method:  Local infiltration   Local anesthetic:  Lidocaine 1% WITH epi Procedure type:    Complexity:  Simple Procedure details:    Needle aspiration: yes     Needle size:  18 G   Incision types:  Stab incision   Incision depth:  Subcutaneous   Scalpel blade:  11   Wound management:  Probed and deloculated   Drainage:  Purulent   Drainage amount:  Moderate   Wound treatment:  Wound left open and drain placed   Packing materials:  1/2 in gauze  Amount 1/2":  2" Post-procedure details:    Patient tolerance of procedure:  Tolerated well, no immediate complications   (including critical care time)  Medications Ordered in UC Medications - No data to display  Initial Impression / Assessment and Plan / UC Course   Wound care, antibiotics, follow-up discussed.  Patient expresses understanding.  Return as needed Final Clinical Impressions(s) / UC Diagnoses   Final diagnoses:  Abscess     Discharge Instructions     Take the sulfamethoxazole/TMP 2 x a day Take tramadol as needed for pain Change dressing I-2 x a day Warm compresses may help   ED Prescriptions    Medication Sig Dispense Auth. Provider   sulfamethoxazole-trimethoprim (BACTRIM DS) 800-160 MG tablet Take 1 tablet by mouth 2 (two) times daily for 7 days. 14 tablet Eustace Moore, MD   traMADol (ULTRAM) 50 MG tablet Take 1 tablet (50 mg total) by mouth every 6 (six) hours as needed. 15 tablet Eustace Moore, MD     I have reviewed the PDMP during this encounter.   Eustace Moore, MD 05/22/20 2059

## 2020-05-22 NOTE — ED Triage Notes (Signed)
Pt presents with an abscess on the left side of the back x 3 weeks and got worse the past 3 days. Pt states nothing makes it better and it hurts when she is moving or not moving.

## 2020-06-20 ENCOUNTER — Encounter: Payer: Self-pay | Admitting: Family Medicine

## 2020-06-20 ENCOUNTER — Telehealth (INDEPENDENT_AMBULATORY_CARE_PROVIDER_SITE_OTHER): Payer: Commercial Managed Care - PPO | Admitting: Family Medicine

## 2020-06-20 VITALS — Ht 65.0 in | Wt 271.0 lb

## 2020-06-20 DIAGNOSIS — R202 Paresthesia of skin: Secondary | ICD-10-CM | POA: Diagnosis not present

## 2020-06-20 MED ORDER — GABAPENTIN 100 MG PO CAPS: 100.0000 mg | ORAL_CAPSULE | Freq: Every day | ORAL | 3 refills | Status: DC

## 2020-06-20 MED ORDER — PREDNISONE 50 MG PO TABS: ORAL_TABLET | ORAL | 0 refills | Status: DC
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
## 2020-07-10 ENCOUNTER — Encounter: Payer: Self-pay | Admitting: Family Medicine

## 2020-07-12 ENCOUNTER — Other Ambulatory Visit: Payer: Self-pay

## 2020-07-12 MED ORDER — GABAPENTIN 300 MG PO CAPS: 300.0000 mg | ORAL_CAPSULE | Freq: Two times a day (BID) | ORAL | 2 refills | Status: DC

## 2020-07-17 ENCOUNTER — Other Ambulatory Visit: Payer: Self-pay

## 2020-07-17 ENCOUNTER — Encounter: Payer: Self-pay | Admitting: Internal Medicine

## 2020-07-17 ENCOUNTER — Ambulatory Visit (INDEPENDENT_AMBULATORY_CARE_PROVIDER_SITE_OTHER): Payer: Commercial Managed Care - PPO | Admitting: Internal Medicine

## 2020-07-17 VITALS — BP 138/92 | HR 72 | Ht 65.0 in | Wt 276.4 lb

## 2020-07-17 DIAGNOSIS — I10 Essential (primary) hypertension: Secondary | ICD-10-CM | POA: Diagnosis not present

## 2020-07-17 DIAGNOSIS — D6869 Other thrombophilia: Secondary | ICD-10-CM

## 2020-07-17 DIAGNOSIS — I480 Paroxysmal atrial fibrillation: Secondary | ICD-10-CM | POA: Diagnosis not present

## 2020-07-17 DIAGNOSIS — G4733 Obstructive sleep apnea (adult) (pediatric): Secondary | ICD-10-CM

## 2020-07-17 NOTE — Patient Instructions (Signed)
Medication Instructions:  Stop Eliquis  Stop Multaq   Labwork: None ordered.  Testing/Procedures: None ordered.  Follow-Up: Your physician wants you to follow-up in:  10/18/20 at 10:30 am with Hillis Range, MD or one of the following Advanced Practice Providers on your designated Care Team:    Gypsy Balsam, NP  Francis Dowse, [place]  Casimiro Needle "Mardelle Matte" Lanna Poche, [place]   Any Other Special Instructions Will Be Listed Below (If Applicable).  If you need a refill on your cardiac medications before your next appointment, please call your pharmacy.

## 2020-07-17 NOTE — Progress Notes (Signed)
PCP: Ardith Dark, MD    Emily Maldonado is a 45 y.o. female who presents today for routine electrophysiology followup.  Since his recent afib ablation, the patient reports doing very well.  she denies procedure related complications and is pleased with the results of the procedure.  Today, she denies symptoms of palpitations, chest pain, shortness of breath,  lower extremity edema, dizziness, presyncope, or syncope.  The patient is otherwise without complaint today.   Past Medical History:  Diagnosis Date  . A-fib (HCC)   . Abnormal Pap smear of cervix   . Anemia    b12  . Anxiety   . B12 deficiency anemia   . Childhood asthma   . Former tobacco use   . Hypertension   . Hypothyroidism   . Migraines    aura  . Morbid obesity (HCC)   . Ovarian cyst   . Sleep apnea    Use C-pap    Past Surgical History:  Procedure Laterality Date  . ATRIAL FIBRILLATION ABLATION N/A 04/18/2020   Procedure: ATRIAL FIBRILLATION ABLATION;  Surgeon: Hillis Range, MD;  Location: [facility];  Service: Cardiovascular;  Laterality: N/A;  . BREAST BIOPSY Left 2014  . BREAST EXCISIONAL BIOPSY Right   . CERVICAL BIOPSY  W/ LOOP ELECTRODE EXCISION    . LEFT HEART CATH AND CORONARY ANGIOGRAPHY N/A 12/04/2017   Procedure: LEFT HEART CATH AND CORONARY ANGIOGRAPHY;  Surgeon: Kathleene Hazel, MD;  Location: [facility];  Service: Cardiovascular;  Laterality: N/A;  . TONSILLECTOMY    . TUBAL LIGATION      ROS- all systems are personally reviewed and negatives except as per HPI above  Current Outpatient Medications  Medication Sig Dispense Refill  . apixaban (ELIQUIS) 5 MG TABS tablet Take 1 tablet (5 mg total) by mouth 2 (two) times daily. 60 tablet 11  . Cholecalciferol (VITAMIN D3) 250 MCG (10000 UT) capsule Take 10,000 Units by mouth daily.    . cyanocobalamin (,VITAMIN B-12,) 1000 MCG/ML injection INJECT 1 ML INTO MUSCLE DAILY FOR 6 DAYS THEN WEEKLY FOR 1 MONTH THEN MONTHLY THEN 1  ML MONTHLY 11 mL 1  . FERROUS SULFATE PO Take 220 mg by mouth daily.     [name] gabapentin (NEURONTIN) 300 MG capsule Take 1 capsule (300 mg total) by mouth 2 (two) times daily. 180 capsule 2  . levothyroxine (SYNTHROID) 112 MCG tablet TAKE 1 TABLET EVERY DAY ON AN EMPTY STOMACH 90 tablet 1  . magnesium oxide (MAG-OX) 400 MG tablet Take 400 mg by mouth 2 (two) times daily.     . medroxyPROGESTERone (DEPO-PROVERA) 150 MG/ML injection Inject 1 mL (150 mg total) into the muscle once for 1 dose. 1 mL 2  . metoprolol succinate (TOPROL-XL) 100 MG 24 hr tablet TAKE 1 TABLET BY MOUTH EVERY DAY WITH OR IMMEDIATELY FOLLOWING A MEAL 90 tablet 1  . metoprolol tartrate (LOPRESSOR) 25 MG tablet TAKE 1 TABLET EVERY 6 HOURS AS NEEDED FOR HR >100 360 tablet 1  . MULTAQ 400 MG tablet TAKE 1 TABLET BY MOUTH TWICE A DAY WITH A MEAL 180 tablet 1  . omeprazole (PRILOSEC) 40 MG capsule Take 1 capsule (40 mg total) by mouth 2 (two) times daily. 60 capsule 3  . Potassium 99 MG TABS Take 99 mg by mouth daily.     . SYRINGE-NEEDLE, DISP, 3 ML 22G X 1-1/2" 3 ML MISC Use daily as directed. 50 each 0  . VITAMIN D PO Take  10,000 Units by mouth daily.      No current facility-administered medications for this visit.    Physical Exam: Vitals:   07/17/20 1156  BP: (!) 138/92  Pulse: 72  SpO2: 95%  Weight: 276 lb 6.4 oz (125.4 kg)  Height: 5\' 5"  (1.651 m)    GEN- The patient is well appearing, alert and oriented x 3 today.   Head- normocephalic, atraumatic Eyes-  Sclera clear, conjunctiva pink Ears- hearing intact Oropharynx- clear Lungs-  bilaterally, normal work of breathing Heart- Regular rate and rhythm rubs or gallops, PMI not laterally displaced GI- soft  Extremities- no clubbing, cyanosis, or edema  EKG tracing ordered today is personally reviewed and shows sinus  Assessment and Plan:  1. Paroxysmal atrial fibrillation and atrial flutter Doing well s/p ablation chads2vasc score is 2.  She is clear that she  wishes to stop eliquis.  This is supported by 2019 updated guidelines. Stop multaq Consider weaning toprol on return  2. OSA Compliance with CPAP advised  3. Obesity Body mass index is 46 kg/m. Lifestyle modification is advised  4. HTN Stable No change required today  Return to see me in 3 months  2020 MD, [facility] 07/17/2020 12:11 PM

## 2020-07-19 ENCOUNTER — Other Ambulatory Visit (HOSPITAL_COMMUNITY): Payer: Self-pay | Admitting: *Deleted

## 2020-07-19 MED ORDER — METOPROLOL TARTRATE 25 MG PO TABS: ORAL_TABLET | ORAL | 1 refills | Status: AC

## 2020-07-29 ENCOUNTER — Other Ambulatory Visit (HOSPITAL_COMMUNITY): Payer: Self-pay | Admitting: Physician Assistant

## 2020-08-02 ENCOUNTER — Other Ambulatory Visit (HOSPITAL_COMMUNITY): Payer: Self-pay | Admitting: Physician Assistant

## 2020-09-18 ENCOUNTER — Encounter (HOSPITAL_COMMUNITY): Payer: Self-pay

## 2020-09-18 ENCOUNTER — Ambulatory Visit (HOSPITAL_COMMUNITY)
Admission: EM | Admit: 2020-09-18 | Discharge: 2020-09-18 | Disposition: A | Discharge status: Home / Self Care | Payer: Commercial Managed Care - PPO | Attending: Urgent Care | Admitting: Urgent Care

## 2020-09-18 ENCOUNTER — Other Ambulatory Visit: Payer: Self-pay

## 2020-09-18 DIAGNOSIS — L0390 Cellulitis, unspecified: Secondary | ICD-10-CM

## 2020-09-18 MED ORDER — NAPROXEN 500 MG PO TABS: 500.0000 mg | ORAL_TABLET | Freq: Two times a day (BID) | ORAL | 0 refills | Status: DC

## 2020-09-18 MED ORDER — DOXYCYCLINE HYCLATE 100 MG PO CAPS: 100.0000 mg | ORAL_CAPSULE | Freq: Two times a day (BID) | ORAL | 0 refills | Status: DC

## 2020-09-18 NOTE — ED Provider Notes (Signed)
[facility]   MRN: [id number] DOB: Aug 04, 1975  Subjective:   Emily Maldonado is a 45 y.o. female presenting for 3-day history of acute onset recurrent left-sided redness, pain.  Patient has a history of boils around these areas on either side due to wearing bras.  Has previously had to have these areas lanced and wants this evaluated today.  She actually did undergo an I&D about a month ago.  She used Bactrim with good relief of her symptoms.  Does not have a dermatologist.  No current facility-administered medications for this encounter.  Current Outpatient Medications:  .  Cholecalciferol (VITAMIN D3) 250 MCG (10000 UT) capsule, Take 10,000 Units by mouth daily., Disp: , Rfl:  .  cyanocobalamin (,VITAMIN B-12,) 1000 MCG/ML injection, INJECT 1 ML INTO MUSCLE DAILY FOR 6 DAYS THEN WEEKLY FOR 1 MONTH THEN MONTHLY THEN 1 ML MONTHLY, Disp: 11 mL, Rfl: 1 .  FERROUS SULFATE PO, Take 220 mg by mouth daily. , Disp: , Rfl:  .  gabapentin (NEURONTIN) 300 MG capsule, Take 1 capsule (300 mg total) by mouth 2 (two) times daily., Disp: 180 capsule, Rfl: 2 .  levothyroxine (SYNTHROID) 112 MCG tablet, TAKE 1 TABLET EVERY DAY ON AN EMPTY STOMACH, Disp: 90 tablet, Rfl: 1 .  magnesium oxide (MAG-OX) 400 MG tablet, Take 400 mg by mouth 2 (two) times daily. , Disp: , Rfl:  .  medroxyPROGESTERone (DEPO-PROVERA) 150 MG/ML injection, Inject 1 mL (150 mg total) into the muscle once for 1 dose., Disp: 1 mL, Rfl: 2 .  metoprolol succinate (TOPROL-XL) 100 MG 24 hr tablet, TAKE 1 TABLET BY MOUTH EVERY DAY WITH OR IMMEDIATELY FOLLOWING A MEAL, Disp: 90 tablet, Rfl: 1 .  metoprolol tartrate (LOPRESSOR) 25 MG tablet, Take 1 tablet every 6 hours as needed for HR >100, Disp: 60 tablet, Rfl: 1 .  omeprazole (PRILOSEC) 40 MG capsule, Take 1 capsule (40 mg total) by mouth 2 (two) times daily., Disp: 60 capsule, Rfl: 3 .  Potassium 99 MG TABS, Take 99 mg by mouth daily. , Disp: , Rfl:  .  SYRINGE-NEEDLE, DISP, 3  ML 22G X 1-1/2" 3 ML MISC, Use daily as directed., Disp: 50 each, Rfl: 0 .  VITAMIN D PO, Take 10,000 Units by mouth daily. , Disp: , Rfl:    Allergies  Allergen Reactions  . Penicillins Anaphylaxis    Has patient had a PCN reaction causing immediate rash, facial/tongue/throat swelling, SOB or lightheadedness with hypotension: /No Has patient had a PCN reaction causing severe rash involving mucus membranes or skin necrosis: No Has patient had a PCN reaction that required hospitalization: No Has patient had a PCN reaction occurring within the last 10 years: No If all of the above answers are "NO", then may proceed with Cephalosporin use.   . Shellfish Allergy Anaphylaxis  . Zithromax [Azithromycin] Rash    Past Medical History:  Diagnosis Date  . A-fib (HCC)   . Abnormal Pap smear of cervix   . Anemia    b12  . Anxiety   . B12 deficiency anemia   . Childhood asthma   . Former tobacco use   . Hypertension   . Hypothyroidism   . Migraines    aura  . Morbid obesity (HCC)   . Ovarian cyst   . Sleep apnea    Use C-pap      Past Surgical History:  Procedure Laterality Date  . ATRIAL FIBRILLATION ABLATION N/A 04/18/2020   Procedure: ATRIAL FIBRILLATION  ABLATION;  Surgeon: Hillis Range, MD;  [facility]: [facility];  Service: Cardiovascular;  Laterality: N/A;  . BREAST BIOPSY Left 2014  . BREAST EXCISIONAL BIOPSY Right   . CERVICAL BIOPSY  W/ LOOP ELECTRODE EXCISION    . LEFT HEART CATH AND CORONARY ANGIOGRAPHY N/A 12/04/2017   Procedure: LEFT HEART CATH AND CORONARY ANGIOGRAPHY;  Surgeon: Kathleene Hazel, MD;  [facility]: [facility];  Service: Cardiovascular;  Laterality: N/A;  . TONSILLECTOMY    . TUBAL LIGATION      Family History  Problem Relation Age of Onset  . Breast cancer Mother 17  . Heart failure Mother   . Atrial fibrillation Mother   . Cervical cancer Mother   . Diabetes Mother   . Hypertension Mother   . Heart failure Father   .  Hypertension Father   . Heart failure Maternal Grandmother   . Atrial fibrillation Maternal Grandmother   . Stroke Maternal Grandmother   . Uterine cancer Maternal Grandmother   . Diabetes Brother   . Hypertension Brother   . Uterine cancer Maternal Aunt     Social History   Tobacco Use  . Smoking status: Current Every Day Smoker    Packs/day: 1.00    Types: Cigarettes  . Smokeless tobacco: Never Used  . Tobacco comment: 3/4 of pack  Vaping Use  . Vaping Use: Never used  Substance Use Topics  . Alcohol use: Not Currently    Alcohol/week: 1.0 standard drink    Types: 1 Standard drinks or equivalent per week  . Drug use: No    ROS   Objective:   Vitals: BP (!) 157/99   Pulse 90   Temp 98.6 F (37 C)   Resp 19   SpO2 99%   Physical Exam Constitutional:      General: She is not in acute distress.    Appearance: Normal appearance. She is well-developed. She is not ill-appearing.  HENT:     Head: Normocephalic and atraumatic.     Nose: Nose normal.     Mouth/Throat:     Mouth: Mucous membranes are moist.     Pharynx: Oropharynx is clear.  Eyes:     General: No scleral icterus.    Extraocular Movements: Extraocular movements intact.     Pupils: Pupils are equal, round, and reactive to light.  Cardiovascular:     Rate and Rhythm: Normal rate.  Pulmonary:     Effort: Pulmonary effort is normal.  Skin:    General: Skin is warm and dry.       Neurological:     General: No focal deficit present.     Mental Status: She is alert and oriented to person, place, and time.  Psychiatric:        Mood and Affect: Mood normal.        Behavior: Behavior normal.     Assessment and Plan :   PDMP not reviewed this encounter.  1. Cellulitis, unspecified cellulitis site     Will manage for cellulitis with doxycycline, naproxen for pain and inflammation.  We will hold off on I&D as there is no area amenable to this procedure. Counseled patient on potential for adverse  effects with medications prescribed/recommended today, ER and return-to-clinic precautions discussed, patient verbalized understanding.    Wallis Bamberg, [place] 09/18/20 [phone]

## 2020-09-18 NOTE — ED Triage Notes (Signed)
Pt in with c/o abscess on left side of back that has been there for 3 days now  Denies any drainage

## 2020-10-02 ENCOUNTER — Other Ambulatory Visit: Payer: Self-pay | Admitting: Family Medicine

## 2020-10-18 ENCOUNTER — Ambulatory Visit: Payer: Commercial Managed Care - PPO | Admitting: Internal Medicine

## 2020-10-18 DIAGNOSIS — I480 Paroxysmal atrial fibrillation: Secondary | ICD-10-CM

## 2020-10-18 DIAGNOSIS — I483 Typical atrial flutter: Secondary | ICD-10-CM

## 2020-10-18 DIAGNOSIS — G4733 Obstructive sleep apnea (adult) (pediatric): Secondary | ICD-10-CM

## 2020-10-18 DIAGNOSIS — I10 Essential (primary) hypertension: Secondary | ICD-10-CM

## 2020-11-06 ENCOUNTER — Other Ambulatory Visit: Payer: Self-pay | Admitting: Family Medicine

## 2020-11-06 ENCOUNTER — Other Ambulatory Visit (HOSPITAL_COMMUNITY): Payer: Self-pay | Admitting: Physician Assistant

## 2020-12-23 ENCOUNTER — Other Ambulatory Visit: Payer: Self-pay | Admitting: Family Medicine

## 2021-02-10 ENCOUNTER — Ambulatory Visit
Admission: EM | Admit: 2021-02-10 | Discharge: 2021-02-10 | Disposition: A | Discharge status: Home / Self Care | Payer: Commercial Managed Care - PPO | Attending: Urgent Care | Admitting: Urgent Care

## 2021-02-10 DIAGNOSIS — L03312 Cellulitis of back [any part except buttock]: Secondary | ICD-10-CM | POA: Diagnosis not present

## 2021-02-10 MED ORDER — DOXYCYCLINE HYCLATE 100 MG PO CAPS: 100.0000 mg | ORAL_CAPSULE | Freq: Two times a day (BID) | ORAL | 0 refills | Status: DC

## 2021-02-10 NOTE — ED Triage Notes (Signed)
Pt presents with a 10 day h/o an abscess on the right side of her thoracic region near her bra area. Pt c/o ongoing pain.  Pt has been taking bactrim for the last 5 days without a decrease in size. Also been using warm compresses without relief.   Pt had a similar lanced back in March 2022 in the same area.

## 2021-02-10 NOTE — Discharge Instructions (Addendum)
Please change your dressing 3-5 times daily. Do not apply any ointments or creams. Each time you change your dressing, make sure that you are pressing on the wound to get pus to come out.  Try your best to have a family member help you clean gently around the perimeter of the wound with gentle soap and warm water. Pat your wound dry and let it air out if possible to make sure it is dry before reapplying another dressing.   

## 2021-02-10 NOTE — ED Provider Notes (Signed)
[facility]   MRN: [id number] DOB: [birth year]  Subjective:   Emily Maldonado is a 45 y.o. female presenting for 10-day history of persistent infection and abscess over the left side of her thoracic region.  Patient has had previous problems with this area.  He has previously had to have aspiration, incision and drainage.  Currently she is on Bactrim, has not been doing well with this.  Has had intermittent drainage and seepage.  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  Denies fever, nausea, vomiting.  No current facility-administered medications for this encounter.  Current Outpatient Medications:    Cholecalciferol (VITAMIN D3) 250 MCG (10000 UT) capsule, Take 10,000 Units by mouth daily., Disp: , Rfl:    cyanocobalamin (,VITAMIN B-12,) 1000 MCG/ML injection, INJECT 1 ML INTO MUSCLE DAILY FOR 6 DAYS THEN WEEKLY FOR 1 MONTH THEN MONTHLY THEN 1 ML MONTHLY, Disp: 11 mL, Rfl: 1   doxycycline (VIBRAMYCIN) 100 MG capsule, Take 1 capsule (100 mg total) by mouth 2 (two) times daily., Disp: 20 capsule, Rfl: 0   FERROUS SULFATE PO, Take 220 mg by mouth daily. , Disp: , Rfl:    gabapentin (NEURONTIN) 300 MG capsule, Take 1 capsule (300 mg total) by mouth 2 (two) times daily., Disp: 180 capsule, Rfl: 2   levothyroxine (SYNTHROID) 112 MCG tablet, TAKE 1 TABLET EVERY DAY ON AN EMPTY STOMACH, Disp: 90 tablet, Rfl: 1   magnesium oxide (MAG-OX) 400 MG tablet, Take 400 mg by mouth 2 (two) times daily. , Disp: , Rfl:    medroxyPROGESTERone (DEPO-PROVERA) 150 MG/ML injection, INJECT 1 ML (150 MG TOTAL) INTO THE MUSCLE ONCE FOR 1 DOSE., Disp: 1 mL, Rfl: 2   metoprolol succinate (TOPROL-XL) 100 MG 24 hr tablet, TAKE 1 TABLET BY MOUTH EVERY DAY WITH OR IMMEDIATELY FOLLOWING A MEAL, Disp: 90 tablet, Rfl: 1   metoprolol tartrate (LOPRESSOR) 25 MG tablet, Take 1 tablet every 6 hours as needed for HR >100, Disp: 60 tablet, Rfl: 1   naproxen (NAPROSYN) 500 MG tablet, Take 1  tablet (500 mg total) by mouth 2 (two) times daily with a meal., Disp: 30 tablet, Rfl: 0   omeprazole (PRILOSEC) 40 MG capsule, Take 1 capsule (40 mg total) by mouth 2 (two) times daily., Disp: 60 capsule, Rfl: 3   Potassium 99 MG TABS, Take 99 mg by mouth daily. , Disp: , Rfl:    SYRINGE-NEEDLE, DISP, 3 ML 22G X 1-1/2" 3 ML MISC, Use daily as directed., Disp: 50 each, Rfl: 0   VITAMIN D PO, Take 10,000 Units by mouth daily. , Disp: , Rfl:    Allergies  Allergen Reactions   Penicillins Anaphylaxis    Has patient had a PCN reaction causing immediate rash, facial/tongue/throat swelling, SOB or lightheadedness with hypotension: /No Has patient had a PCN reaction causing severe rash involving mucus membranes or skin necrosis: No Has patient had a PCN reaction that required hospitalization: No Has patient had a PCN reaction occurring within the last 10 years: No If all of the above answers are "NO", then may proceed with Cephalosporin use.    Shellfish Allergy Anaphylaxis   Zithromax [Azithromycin] Rash    Past Medical History:  Diagnosis Date   A-fib (HCC)    Abnormal Pap smear of cervix    Anemia    b12   Anxiety    B12 deficiency anemia    Childhood asthma    Former tobacco use  Hypertension    Hypothyroidism    Migraines    aura   Morbid obesity (HCC)    Ovarian cyst    Sleep apnea    Use C-pap      Past Surgical History:  Procedure Laterality Date   ATRIAL FIBRILLATION ABLATION N/A 04/18/2020   Procedure: ATRIAL FIBRILLATION ABLATION;  Surgeon: Hillis Range, MD;  [facility];  Service: Cardiovascular;  Laterality: N/A;   BREAST BIOPSY Left 2014   BREAST EXCISIONAL BIOPSY Right    CERVICAL BIOPSY  W/ LOOP ELECTRODE EXCISION     LEFT HEART CATH AND CORONARY ANGIOGRAPHY N/A 12/04/2017   Procedure: LEFT HEART CATH AND CORONARY ANGIOGRAPHY;  Surgeon: Kathleene Hazel, MD;  [facility]: [facility];  Service: Cardiovascular;  Laterality: N/A;    TONSILLECTOMY     TUBAL LIGATION      Family History  Problem Relation Age of Onset   Breast cancer Mother 40   Heart failure Mother    Atrial fibrillation Mother    Cervical cancer Mother    Diabetes Mother    Hypertension Mother    Heart failure Father    Hypertension Father    Heart failure Maternal Grandmother    Atrial fibrillation Maternal Grandmother    Stroke Maternal Grandmother    Uterine cancer Maternal Grandmother    Diabetes Brother    Hypertension Brother    Uterine cancer Maternal Aunt     Social History   Tobacco Use   Smoking status: Every Day    Packs/day: 1.00    Types: Cigarettes   Smokeless tobacco: Never   Tobacco comments:    3/4 of pack  Vaping Use   Vaping Use: Never used  Substance Use Topics   Alcohol use: Not Currently    Alcohol/week: 1.0 standard drink    Types: 1 Standard drinks or equivalent per week   Drug use: No    ROS   Objective:   Vitals: BP 135/82 (BP Location: Left Arm)   Pulse 85   Temp 98 F (36.7 C) (Oral)   Resp 18   SpO2 95%   Physical Exam Constitutional:      General: She is not in acute distress.    Appearance: Normal appearance. She is well-developed. She is not ill-appearing, toxic-appearing or diaphoretic.  HENT:     Head: Normocephalic and atraumatic.     Nose: Nose normal.     Mouth/Throat:     Mouth: Mucous membranes are moist.     Pharynx: Oropharynx is clear.  Eyes:     General: No scleral icterus.       Right eye: No discharge.        Left eye: No discharge.     Extraocular Movements: Extraocular movements intact.     Conjunctiva/sclera: Conjunctivae normal.     Pupils: Pupils are equal, round, and reactive to light.  Cardiovascular:     Rate and Rhythm: Normal rate.  Pulmonary:     Effort: Pulmonary effort is normal.  Skin:    General: Skin is warm and dry.       Neurological:     General: No focal deficit present.     Mental Status: She is alert and oriented to person, place,  and time.  Psychiatric:        Mood and Affect: Mood normal.        Behavior: Behavior normal.        Thought Content: Thought content normal.  Judgment: Judgment normal.   Aspiration: Verbal consent obtained.  Iodine used to clean the area, an 18-gauge needle was inserted into the skin up to 1 cm of depth.  No purulence aspirated.  Pressure dressing applied.  Assessment and Plan :   PDMP not reviewed this encounter.  1. Cellulitis of back     Patient was pleased with the attempt for aspiration.  No areas of fluctuance and therefore will avoid incision and drainage.  Start doxycycline, discontinue Bactrim. Counseled patient on potential for adverse effects with medications prescribed/recommended today, ER and return-to-clinic precautions discussed, patient verbalized understanding.    Wallis Bamberg, [place] 02/10/21 [phone]

## 2021-03-19 ENCOUNTER — Ambulatory Visit (INDEPENDENT_AMBULATORY_CARE_PROVIDER_SITE_OTHER): Payer: Commercial Managed Care - PPO | Admitting: Internal Medicine

## 2021-03-19 ENCOUNTER — Other Ambulatory Visit: Payer: Self-pay

## 2021-03-19 ENCOUNTER — Encounter: Payer: Self-pay | Admitting: Internal Medicine

## 2021-03-19 VITALS — BP 158/96 | HR 83 | Ht 65.0 in | Wt 287.6 lb

## 2021-03-19 DIAGNOSIS — I483 Typical atrial flutter: Secondary | ICD-10-CM | POA: Diagnosis not present

## 2021-03-19 DIAGNOSIS — I480 Paroxysmal atrial fibrillation: Secondary | ICD-10-CM | POA: Diagnosis not present

## 2021-03-19 DIAGNOSIS — I10 Essential (primary) hypertension: Secondary | ICD-10-CM | POA: Diagnosis not present

## 2021-03-19 DIAGNOSIS — G4733 Obstructive sleep apnea (adult) (pediatric): Secondary | ICD-10-CM | POA: Diagnosis not present

## 2021-03-19 MED ORDER — FUROSEMIDE 20 MG PO TABS: 20.0000 mg | ORAL_TABLET | Freq: Every day | ORAL | 11 refills | Status: DC

## 2021-03-19 MED ORDER — POTASSIUM CHLORIDE CRYS ER 20 MEQ PO TBCR: 20.0000 meq | EXTENDED_RELEASE_TABLET | Freq: Every day | ORAL | 3 refills | Status: DC

## 2021-03-19 NOTE — Patient Instructions (Addendum)
Medication Instructions:  Start Potassium 20 mEq daily Start Lasix 20 mg daily  Your physician recommends that you continue on your current medications as directed. Please refer to the Current Medication list given to you today.  Labwork: BMP, CBC, TSH, Pro BNP  Testing/Procedures: Your physician has requested that you have an echocardiogram. Echocardiography is a painless test that uses sound waves to create images of your heart. It provides your doctor with information about the size and shape of your heart and how well your heart's chambers and valves are working. This procedure takes approximately one hour. There are no restrictions for this procedure.   Follow-Up: Your physician wants you to follow-up in: 3 weeks with one of the following Advanced Practice Providers on your designated Care Team:     Casimiro Needle "Mardelle Matte" Lanna Poche, [place]     Any Other Special Instructions Will Be Listed Below (If Applicable).  If you need a refill on your cardiac medications before your next appointment, please call your pharmacy.

## 2021-03-19 NOTE — Progress Notes (Signed)
PCP: Ardith Dark, MD   Primary EP: Dr Johney Frame  Emily Maldonado is a 45 y.o. female who presents today for routine electrophysiology followup.  Since last being seen in our clinic, the patient reports doing reasonably well.  She has rare and short palpitations.  She has noticed increasing edema and SOB over the past few months.  Today, she denies symptoms of palpitations, chest pain, dizziness, presyncope, or syncope.  The patient is otherwise without complaint today.   Past Medical History:  Diagnosis Date   A-fib (HCC)    Abnormal Pap smear of cervix    Anemia    b12   Anxiety    B12 deficiency anemia    Childhood asthma    Former tobacco use    Hypertension    Hypothyroidism    Migraines    aura   Morbid obesity (HCC)    Ovarian cyst    Sleep apnea    Use C-pap    Past Surgical History:  Procedure Laterality Date   ATRIAL FIBRILLATION ABLATION N/A 04/18/2020   Procedure: ATRIAL FIBRILLATION ABLATION;  Surgeon: Hillis Range, MD;  [facility];  Service: Cardiovascular;  Laterality: N/A;   BREAST BIOPSY Left 2014   BREAST EXCISIONAL BIOPSY Right    CERVICAL BIOPSY  W/ LOOP ELECTRODE EXCISION     LEFT HEART CATH AND CORONARY ANGIOGRAPHY N/A 12/04/2017   Procedure: LEFT HEART CATH AND CORONARY ANGIOGRAPHY;  Surgeon: Kathleene Hazel, MD;  [facility]: [facility];  Service: Cardiovascular;  Laterality: N/A;   TONSILLECTOMY     TUBAL LIGATION      ROS- all systems are reviewed and negatives except as per HPI above  Current Outpatient Medications  Medication Sig Dispense Refill   Cholecalciferol (VITAMIN D3) 250 MCG (10000 UT) capsule Take 10,000 Units by mouth daily.     cyanocobalamin (,VITAMIN B-12,) 1000 MCG/ML injection INJECT 1 ML INTO MUSCLE DAILY FOR 6 DAYS THEN WEEKLY FOR 1 MONTH THEN MONTHLY THEN 1 ML MONTHLY 11 mL 1   FERROUS SULFATE PO Take 220 mg by mouth daily.      gabapentin (NEURONTIN) 300 MG capsule Take 1 capsule (300 mg total)  by mouth 2 (two) times daily. 180 capsule 2   levothyroxine (SYNTHROID) 112 MCG tablet TAKE 1 TABLET EVERY DAY ON AN EMPTY STOMACH 90 tablet 1   magnesium oxide (MAG-OX) 400 MG tablet Take 400 mg by mouth 2 (two) times daily.      metoprolol succinate (TOPROL-XL) 100 MG 24 hr tablet TAKE 1 TABLET BY MOUTH EVERY DAY WITH OR IMMEDIATELY FOLLOWING A MEAL 90 tablet 1   metoprolol tartrate (LOPRESSOR) 25 MG tablet Take 1 tablet every 6 hours as needed for HR >100 60 tablet 1   omeprazole (PRILOSEC) 40 MG capsule Take 1 capsule (40 mg total) by mouth 2 (two) times daily. 60 capsule 3   Potassium 99 MG TABS Take 99 mg by mouth daily.      SYRINGE-NEEDLE, DISP, 3 ML 22G X 1-1/2" 3 ML MISC Use daily as directed. 50 each 0   VITAMIN D PO Take 10,000 Units by mouth daily.      doxycycline (VIBRAMYCIN) 100 MG capsule Take 1 capsule (100 mg total) by mouth 2 (two) times daily. (Patient not taking: Reported on 03/19/2021) 20 capsule 0   medroxyPROGESTERone (DEPO-PROVERA) 150 MG/ML injection INJECT 1 ML (150 MG TOTAL) INTO THE MUSCLE ONCE FOR 1 DOSE. 1 mL 2   naproxen (NAPROSYN)  500 MG tablet Take 1 tablet (500 mg total) by mouth 2 (two) times daily with a meal. (Patient not taking: Reported on 03/19/2021) 30 tablet 0   No current facility-administered medications for this visit.    Physical Exam: Vitals:   03/19/21 1510  BP: (!) 158/96  Pulse: 83  SpO2: 93%  Weight: 287 lb 9.6 oz (130.5 kg)  Height: 5\' 5"  (1.651 m)    GEN- The patient is well appearing, alert and oriented x 3 today.   Head- normocephalic, atraumatic Eyes-  Sclera clear, conjunctiva pink Ears- hearing intact Oropharynx- clear Lungs- Clear to ausculation bilaterally, normal work of breathing Heart- Regular rate and rhythm, no murmurs, rubs or gallops, PMI not laterally displaced GI- soft, NT, ND, + BS Extremities- no clubbing, cyanosis, + 1 pretibial edema  Wt Readings from Last 3 Encounters:  03/19/21 287 lb 9.6 oz (130.5 kg)   07/17/20 276 lb 6.4 oz (125.4 kg)  06/20/20 271 lb (122.9 kg)    EKG tracing ordered today is personally reviewed and shows sinus  Assessment and Plan:  Paroxysmal atrial fibrillation/ atrial flutter Doing well s/p ablation Chads2vasc score is 2.  She wishes to remain off of OAC.  Guidelines support her decision  2. CPAP Compliance with CPAP is advised  3. HTN Stable No change required today Elevated today 2 gram sodium diet advised  4. Obesity Body mass index is 47.86 kg/m. Lifestyle modification is advised  5. SOB/ edema I am concerned for acute diastolic dysfunction Will order an echo, pro BNP Start lasix 20mg  daily KDur 20 meq daily Will also check cbc, bmet and TSH to look for additional causes If BNP is elevated, we will consider Alleviate HF trial.  6. Hypothyroidism As per above, given pretibial edema, will order TSH  Risks, benefits and potential toxicities for medications prescribed and/or refilled reviewed with patient today.   Return to see EP APP in 3 weeks   06/22/20 MD, [facility] 03/19/2021 3:19 PM

## 2021-03-20 ENCOUNTER — Ambulatory Visit (HOSPITAL_COMMUNITY): Payer: Commercial Managed Care - PPO | Attending: Internal Medicine

## 2021-03-20 DIAGNOSIS — G4733 Obstructive sleep apnea (adult) (pediatric): Secondary | ICD-10-CM | POA: Insufficient documentation

## 2021-03-20 DIAGNOSIS — I480 Paroxysmal atrial fibrillation: Secondary | ICD-10-CM | POA: Diagnosis present

## 2021-03-20 DIAGNOSIS — I10 Essential (primary) hypertension: Secondary | ICD-10-CM | POA: Insufficient documentation

## 2021-03-20 DIAGNOSIS — I483 Typical atrial flutter: Secondary | ICD-10-CM | POA: Diagnosis not present

## 2021-03-20 LAB — BASIC METABOLIC PANEL
BUN/Creatinine Ratio: 13 (ref 9–23)
BUN: 8 mg/dL (ref 6–24)
CO2: 25 mmol/L (ref 20–29)
Calcium: 9.1 mg/dL (ref 8.7–10.2)
Chloride: 101 mmol/L (ref 96–106)
Creatinine, Ser: 0.64 mg/dL (ref 0.57–1.00)
Glucose: 84 mg/dL (ref 65–99)
Potassium: 4.1 mmol/L (ref 3.5–5.2)
Sodium: 139 mmol/L (ref 134–144)
eGFR: 110 mL/min/{1.73_m2} (ref >59)

## 2021-03-20 LAB — CBC
Hematocrit: 43.4 % (ref 34.0–46.6)
Hemoglobin: 15.4 g/dL (ref 11.1–15.9)
MCH: 29.9 pg (ref 26.6–33.0)
MCHC: 35.5 g/dL (ref 31.5–35.7)
MCV: 84 fL (ref 79–97)
Platelets: 272 10*3/uL (ref 150–450)
RBC: 5.15 x10E6/uL (ref 3.77–5.28)
RDW: 13.3 % (ref 11.7–15.4)
WBC: 10.9 10*3/uL — ABNORMAL HIGH (ref 3.4–10.8)

## 2021-03-20 LAB — ECHOCARDIOGRAM COMPLETE
Area-P 1/2: 3.99 cm2
S' Lateral: 3.1 cm

## 2021-03-20 LAB — PRO B NATRIURETIC PEPTIDE: NT-Pro BNP: 238 pg/mL (ref 0–249)

## 2021-03-20 LAB — TSH: TSH: 1.75 u[IU]/mL (ref 0.450–4.500)

## 2021-03-20 MED ORDER — PERFLUTREN LIPID MICROSPHERE
1.0000 mL | INTRAVENOUS | Status: AC | PRN
  Administered 2021-03-20: 2 mL via INTRAVENOUS

## 2021-04-02 ENCOUNTER — Other Ambulatory Visit: Payer: Self-pay | Admitting: Family Medicine

## 2021-04-15 NOTE — Progress Notes (Deleted)
PCP:  Ardith Dark, MD Primary Cardiologist: None Electrophysiologist: None   Emily Maldonado is a 45 y.o. female seen today for None for {Blank single:19197::"cardiac clearance","post hospital follow up","acute visit due to ***","routine electrophysiology followup"}.  Since {Blank single:19197::"last being seen in our clinic","discharge from hospital"} the patient reports doing ***.  she denies chest pain, palpitations, dyspnea, PND, orthopnea, nausea, vomiting, dizziness, syncope, edema, weight gain, or early satiety.  Past Medical History:  Diagnosis Date   A-fib (HCC)    Abnormal Pap smear of cervix    Anemia    b12   Anxiety    B12 deficiency anemia    Childhood asthma    Former tobacco use    Hypertension    Hypothyroidism    Migraines    aura   Morbid obesity (HCC)    Ovarian cyst    Sleep apnea    Use C-pap    Past Surgical History:  Procedure Laterality Date   ATRIAL FIBRILLATION ABLATION N/A 04/18/2020   Procedure: ATRIAL FIBRILLATION ABLATION;  Surgeon: Hillis Range, MD;  [facility]: [facility];  Service: Cardiovascular;  Laterality: N/A;   BREAST BIOPSY Left 2014   BREAST EXCISIONAL BIOPSY Right    CERVICAL BIOPSY  W/ LOOP ELECTRODE EXCISION     LEFT HEART CATH AND CORONARY ANGIOGRAPHY N/A 12/04/2017   Procedure: LEFT HEART CATH AND CORONARY ANGIOGRAPHY;  Surgeon: Kathleene Hazel, MD;  Location: [facility];  Service: Cardiovascular;  Laterality: N/A;   TONSILLECTOMY     TUBAL LIGATION      Current Outpatient Medications  Medication Sig Dispense Refill   Cholecalciferol (VITAMIN D3) 250 MCG (10000 UT) capsule Take 10,000 Units by mouth daily.     cyanocobalamin (,VITAMIN B-12,) 1000 MCG/ML injection INJECT 1 ML INTO MUSCLE DAILY FOR 6 DAYS THEN WEEKLY FOR 1 MONTH THEN MONTHLY THEN 1 ML MONTHLY 11 mL 1   doxycycline (VIBRAMYCIN) 100 MG capsule Take 1 capsule (100 mg total) by mouth 2 (two) times daily. (Patient not taking: Reported on  03/19/2021) 20 capsule 0   FERROUS SULFATE PO Take 220 mg by mouth daily.      furosemide (LASIX) 20 MG tablet Take 1 tablet (20 mg total) by mouth daily. 30 tablet 11   gabapentin (NEURONTIN) 300 MG capsule TAKE 1 CAPSULE BY MOUTH TWICE A DAY 180 capsule 2   levothyroxine (SYNTHROID) 112 MCG tablet TAKE 1 TABLET EVERY DAY ON AN EMPTY STOMACH 90 tablet 1   magnesium oxide (MAG-OX) 400 MG tablet Take 400 mg by mouth 2 (two) times daily.      medroxyPROGESTERone (DEPO-PROVERA) 150 MG/ML injection INJECT 1 ML (150 MG TOTAL) INTO THE MUSCLE ONCE FOR 1 DOSE. 1 mL 2   metoprolol succinate (TOPROL-XL) 100 MG 24 hr tablet TAKE 1 TABLET BY MOUTH EVERY DAY WITH OR IMMEDIATELY FOLLOWING A MEAL 90 tablet 1   metoprolol tartrate (LOPRESSOR) 25 MG tablet Take 1 tablet every 6 hours as needed for HR >100 60 tablet 1   naproxen (NAPROSYN) 500 MG tablet Take 1 tablet (500 mg total) by mouth 2 (two) times daily with a meal. (Patient not taking: Reported on 03/19/2021) 30 tablet 0   omeprazole (PRILOSEC) 40 MG capsule Take 1 capsule (40 mg total) by mouth 2 (two) times daily. 60 capsule 3   potassium chloride SA (KLOR-CON) 20 MEQ tablet Take 1 tablet (20 mEq total) by mouth daily. 90 tablet 3   SYRINGE-NEEDLE, DISP, 3 ML 22G X  1-1/2" 3 ML MISC Use daily as directed. 50 each 0   VITAMIN D PO Take 10,000 Units by mouth daily.      No current facility-administered medications for this visit.    Allergies  Allergen Reactions   Penicillins Anaphylaxis    Has patient had a PCN reaction causing immediate rash, facial/tongue/throat swelling, SOB or lightheadedness with hypotension: /No Has patient had a PCN reaction causing severe rash involving mucus membranes or skin necrosis: No Has patient had a PCN reaction that required hospitalization: No Has patient had a PCN reaction occurring within the last 10 years: No If all of the above answers are "NO", then may proceed with Cephalosporin use.    Shellfish Allergy  Anaphylaxis   Zithromax [Azithromycin] Rash    Social History   Socioeconomic History   Marital status: Single    Spouse name: Not on file   Number of children: Not on file   Years of education: Not on file   Highest education level: Not on file  Occupational History   Not on file  Tobacco Use   Smoking status: Every Day    Packs/day: 1.00    Types: Cigarettes   Smokeless tobacco: Never   Tobacco comments:    3/4 of pack  Vaping Use   Vaping Use: Never used  Substance and Sexual Activity   Alcohol use: Not Currently    Alcohol/week: 1.0 standard drink    Types: 1 Standard drinks or equivalent per week   Drug use: No   Sexual activity: Yes    Partners: Male    Birth control/protection: Surgical    Comment: btl  Other Topics Concern   Not on file  Social History Narrative   Not on file   Social Determinants of Health   Financial Resource Strain: Not on file  Food Insecurity: Not on file  Transportation Needs: Not on file  Physical Activity: Not on file  Stress: Not on file  Social Connections: Not on file  Intimate Partner Violence: Not on file     Review of Systems: All other systems reviewed and are otherwise negative except as noted above.  Physical Exam: There were no vitals filed for this visit.  GEN- The patient is well appearing, alert and oriented x 3 today.   HEENT: normocephalic, atraumatic; sclera clear, conjunctiva pink; hearing intact; oropharynx clear; neck supple, no JVP Lymph- no cervical lymphadenopathy Lungs- Clear to ausculation bilaterally, normal work of breathing.  No wheezes, rales, rhonchi Heart- Regular rate and rhythm, no murmurs, rubs or gallops, PMI not laterally displaced GI- soft, non-tender, non-distended, bowel sounds present, no hepatosplenomegaly Extremities- no clubbing, cyanosis, or edema; DP/PT/radial pulses 2+ bilaterally MS- no significant deformity or atrophy Skin- warm and dry, no rash or lesion Psych- euthymic  mood, full affect Neuro- strength and sensation are intact  EKG is not ordered.   Additional studies reviewed include: Previous EP office notes. ***  Assessment and Plan:  Paroxysmal atrial fibrillation/ atrial flutter Stable s/p ablation Chads2vasc score is 2.  She wishes to remain off of OAC, which is supported by the most recent guidelines.      2. CPAP Encouraged nightly CPAP     3.HTN Stable on current regimen   4. Obesity There is no height or weight on file to calculate BMI.  Encouraged lifestyle modification   5. SOB/ edema I am concerned for acute diastolic dysfunction Will order an echo, pro BNP Start lasix 20mg  daily KDur 20 meq daily  Will also check cbc, bmet and TSH to look for additional causes If BNP is elevated, we will consider Alleviate HF trial.   6. Hypothyroidism As per above, given pretibial edema, will order TSH  Graciella Freer, PA-C  04/15/21 9:08 PM

## 2021-04-16 ENCOUNTER — Ambulatory Visit: Payer: Commercial Managed Care - PPO | Admitting: Student

## 2021-04-16 DIAGNOSIS — I483 Typical atrial flutter: Secondary | ICD-10-CM

## 2021-04-16 DIAGNOSIS — I10 Essential (primary) hypertension: Secondary | ICD-10-CM

## 2021-04-16 DIAGNOSIS — G4733 Obstructive sleep apnea (adult) (pediatric): Secondary | ICD-10-CM

## 2021-04-16 DIAGNOSIS — I480 Paroxysmal atrial fibrillation: Secondary | ICD-10-CM

## 2021-04-16 DIAGNOSIS — I5032 Chronic diastolic (congestive) heart failure: Secondary | ICD-10-CM

## 2021-04-27 NOTE — Progress Notes (Signed)
PCP:  Ardith Dark, MD Primary Cardiologist: None Electrophysiologist: Hillis Range, MD   Emily Maldonado is a 45 y.o. female seen today for Hillis Range, MD for routine electrophysiology followup.  Since last being seen in our clinic the patient reports doing about the same. She continues to have peripheral edema/pre-tibial edema. States she is watching fluid intake. Frequently eats high sodium meals. Had [place] for dinner last night, eats oodles of noodles. UOP is OK on lasix.  she denies chest pain, palpitations, dyspnea, PND, orthopnea, nausea, vomiting, dizziness, syncope, weight gain, or early satiety.  Past Medical History:  Diagnosis Date   A-fib (HCC)    Abnormal Pap smear of cervix    Anemia    b12   Anxiety    B12 deficiency anemia    Childhood asthma    Former tobacco use    Hypertension    Hypothyroidism    Migraines    aura   Morbid obesity (HCC)    Ovarian cyst    Sleep apnea    Use C-pap    Past Surgical History:  Procedure Laterality Date   ATRIAL FIBRILLATION ABLATION N/A 04/18/2020   Procedure: ATRIAL FIBRILLATION ABLATION;  Surgeon: Hillis Range, MD;  [facility];  Service: Cardiovascular;  Laterality: N/A;   BREAST BIOPSY Left 2014   BREAST EXCISIONAL BIOPSY Right    CERVICAL BIOPSY  W/ LOOP ELECTRODE EXCISION     LEFT HEART CATH AND CORONARY ANGIOGRAPHY N/A 12/04/2017   Procedure: LEFT HEART CATH AND CORONARY ANGIOGRAPHY;  Surgeon: Kathleene Hazel, MD;  Location: [facility];  Service: Cardiovascular;  Laterality: N/A;   TONSILLECTOMY     TUBAL LIGATION      Current Outpatient Medications  Medication Sig Dispense Refill   Cholecalciferol (VITAMIN D3) 250 MCG (10000 UT) capsule Take 10,000 Units by mouth daily.     cyanocobalamin (,VITAMIN B-12,) 1000 MCG/ML injection INJECT 1 ML INTO MUSCLE DAILY FOR 6 DAYS THEN WEEKLY FOR 1 MONTH THEN MONTHLY THEN 1 ML MONTHLY 11 mL 1   FERROUS SULFATE PO Take 220 mg by mouth daily.       furosemide (LASIX) 20 MG tablet Take 1 tablet (20 mg total) by mouth daily. 30 tablet 11   gabapentin (NEURONTIN) 300 MG capsule TAKE 1 CAPSULE BY MOUTH TWICE A DAY 180 capsule 2   levothyroxine (SYNTHROID) 112 MCG tablet TAKE 1 TABLET EVERY DAY ON AN EMPTY STOMACH 90 tablet 1   magnesium oxide (MAG-OX) 400 MG tablet Take 400 mg by mouth 2 (two) times daily.      metoprolol succinate (TOPROL-XL) 100 MG 24 hr tablet TAKE 1 TABLET BY MOUTH EVERY DAY WITH OR IMMEDIATELY FOLLOWING A MEAL 90 tablet 1   metoprolol tartrate (LOPRESSOR) 25 MG tablet Take 1 tablet every 6 hours as needed for HR >100 60 tablet 1   omeprazole (PRILOSEC) 40 MG capsule Take 1 capsule (40 mg total) by mouth 2 (two) times daily. 60 capsule 3   potassium chloride SA (KLOR-CON) 20 MEQ tablet Take 1 tablet (20 mEq total) by mouth daily. 90 tablet 3   SYRINGE-NEEDLE, DISP, 3 ML 22G X 1-1/2" 3 ML MISC Use daily as directed. 50 each 0   No current facility-administered medications for this visit.    Allergies  Allergen Reactions   Penicillins Anaphylaxis    Has patient had a PCN reaction causing immediate rash, facial/tongue/throat swelling, SOB or lightheadedness with hypotension: /No Has patient had a PCN  reaction causing severe rash involving mucus membranes or skin necrosis: No Has patient had a PCN reaction that required hospitalization: No Has patient had a PCN reaction occurring within the last 10 years: No If all of the above answers are "NO", then may proceed with Cephalosporin use.    Shellfish Allergy Anaphylaxis   Zithromax [Azithromycin] Rash    Social History   Socioeconomic History   Marital status: Single    Spouse name: Not on file   Number of children: Not on file   Years of education: Not on file   Highest education level: Not on file  Occupational History   Not on file  Tobacco Use   Smoking status: Every Day    Packs/day: 1.00    Types: Cigarettes   Smokeless tobacco: Never   Tobacco  comments:    3/4 of pack  Vaping Use   Vaping Use: Never used  Substance and Sexual Activity   Alcohol use: Not Currently    Alcohol/week: 1.0 standard drink    Types: 1 Standard drinks or equivalent per week   Drug use: No   Sexual activity: Yes    Partners: Male    Birth control/protection: Surgical    Comment: btl  Other Topics Concern   Not on file  Social History Narrative   Not on file   Social Determinants of Health   Financial Resource Strain: Not on file  Food Insecurity: Not on file  Transportation Needs: Not on file  Physical Activity: Not on file  Stress: Not on file  Social Connections: Not on file  Intimate Partner Violence: Not on file     Review of Systems: All other systems reviewed and are otherwise negative except as noted above.  Physical Exam: Vitals:   04/30/21 0913  BP: (!) 148/88  Pulse: 81  SpO2: 95%  Weight: 288 lb (130.6 kg)  Height: 5\' 5"  (1.651 m)    GEN- The patient is well appearing, alert and oriented x 3 today.   HEENT: normocephalic, atraumatic; sclera clear, conjunctiva pink; hearing intact; oropharynx clear; neck supple, no JVP Lymph- no cervical lymphadenopathy Lungs- Clear to ausculation bilaterally, normal work of breathing.  No wheezes, rales, rhonchi Heart- Regular rate and rhythm, no murmurs, rubs or gallops, PMI not laterally displaced GI- soft, non-tender, non-distended, bowel sounds present, no hepatosplenomegaly Extremities- no clubbing, cyanosis, or edema; DP/PT/radial pulses 2+ bilaterally MS- no significant deformity or atrophy Skin- warm and dry, no rash or lesion Psych- euthymic mood, full affect Neuro- strength and sensation are intact  EKG is not ordered.   Additional studies reviewed include: Previous EP office notes.   Assessment and Plan:  Paroxysmal atrial fibrillation/ atrial flutter Overall doing well  well s/p ablation CHA2DS2/VASc of at least 2.  She wishes to remain off of OAC, which is  supported by current guidelines.    2. CPAP Encouraged nightly CPAP.    3. HTN Stable on current regimen    4. Obesity Body mass index is 47.93 kg/m.  Lifestyle modification is encouraged   5. Chronic diastolic CHF Echo 03/20/2021 LVEF 60-65%, Grade 1 DD. Otherwise stable.  Continue lasix 20mg  daily and KDur 20 meq daily Add spiro 25 mg daily. BMET today and 10-14 days.  Can take extra lasix for a couple of days to get back to baseline.  Discussed importance of sodium restriction   6. Hypothyroidism TSH WNL.   Follow up with EP APP in 3 months. 03/22/2021 added. BMET today and  10-14 days.   Graciella Freer, PA-C  04/30/21 9:19 AM

## 2021-04-30 ENCOUNTER — Ambulatory Visit (INDEPENDENT_AMBULATORY_CARE_PROVIDER_SITE_OTHER): Payer: Commercial Managed Care - PPO | Admitting: Student

## 2021-04-30 ENCOUNTER — Other Ambulatory Visit: Payer: Self-pay

## 2021-04-30 ENCOUNTER — Encounter: Payer: Self-pay | Admitting: Student

## 2021-04-30 VITALS — BP 148/88 | HR 81 | Ht 65.0 in | Wt 288.0 lb

## 2021-04-30 DIAGNOSIS — G4733 Obstructive sleep apnea (adult) (pediatric): Secondary | ICD-10-CM

## 2021-04-30 DIAGNOSIS — I480 Paroxysmal atrial fibrillation: Secondary | ICD-10-CM | POA: Diagnosis not present

## 2021-04-30 DIAGNOSIS — I10 Essential (primary) hypertension: Secondary | ICD-10-CM

## 2021-04-30 DIAGNOSIS — I483 Typical atrial flutter: Secondary | ICD-10-CM

## 2021-04-30 DIAGNOSIS — R0609 Other forms of dyspnea: Secondary | ICD-10-CM

## 2021-04-30 LAB — BASIC METABOLIC PANEL
BUN/Creatinine Ratio: 14 (ref 9–23)
BUN: 10 mg/dL (ref 6–24)
CO2: 24 mmol/L (ref 20–29)
Calcium: 8.8 mg/dL (ref 8.7–10.2)
Chloride: 103 mmol/L (ref 96–106)
Creatinine, Ser: 0.7 mg/dL (ref 0.57–1.00)
Glucose: 83 mg/dL (ref 70–99)
Potassium: 4.2 mmol/L (ref 3.5–5.2)
Sodium: 139 mmol/L (ref 134–144)
eGFR: 107 mL/min/{1.73_m2} (ref >59)

## 2021-04-30 MED ORDER — SPIRONOLACTONE 25 MG PO TABS: 25.0000 mg | ORAL_TABLET | Freq: Every day | ORAL | 3 refills | Status: DC

## 2021-04-30 NOTE — Patient Instructions (Signed)
Medication Instructions:  Your physician has recommended you make the following change in your medication:   START: Spironolactone 25mg  daily  *If you need a refill on your cardiac medications before your next appointment, please call your pharmacy*   Lab Work: TODAY: BMET BMET on 05/14/2021  If you have labs (blood work) drawn today and your tests are completely normal, you will receive your results only by: MyChart Message (if you have MyChart) OR A paper copy in the mail If you have any lab test that is abnormal or we need to change your treatment, we will call you to review the results.   Follow-Up: At [facility], you and your health needs are our priority.  As part of our continuing mission to provide you with exceptional heart care, we have created designated Provider Care Teams.  These Care Teams include your primary Cardiologist (physician) and Advanced Practice Providers (APPs -  Physician Assistants and Nurse Practitioners) who all work together to provide you with the care you need, when you need it.   Your next appointment:   As scheduled  Other Instructions Take an extra Lasix for 2 days

## 2021-05-01 ENCOUNTER — Other Ambulatory Visit (HOSPITAL_COMMUNITY): Payer: Self-pay | Admitting: Internal Medicine

## 2021-05-01 ENCOUNTER — Other Ambulatory Visit: Payer: Self-pay | Admitting: Family Medicine

## 2021-05-14 ENCOUNTER — Other Ambulatory Visit: Payer: Self-pay

## 2021-05-14 ENCOUNTER — Other Ambulatory Visit: Payer: Commercial Managed Care - PPO | Admitting: *Deleted

## 2021-05-14 DIAGNOSIS — I483 Typical atrial flutter: Secondary | ICD-10-CM

## 2021-05-14 DIAGNOSIS — I10 Essential (primary) hypertension: Secondary | ICD-10-CM

## 2021-05-14 DIAGNOSIS — R0609 Other forms of dyspnea: Secondary | ICD-10-CM

## 2021-05-14 DIAGNOSIS — I480 Paroxysmal atrial fibrillation: Secondary | ICD-10-CM

## 2021-05-14 DIAGNOSIS — G4733 Obstructive sleep apnea (adult) (pediatric): Secondary | ICD-10-CM

## 2021-05-14 LAB — BASIC METABOLIC PANEL
BUN/Creatinine Ratio: 17 (ref 9–23)
BUN: 11 mg/dL (ref 6–24)
CO2: 25 mmol/L (ref 20–29)
Calcium: 9.1 mg/dL (ref 8.7–10.2)
Chloride: 102 mmol/L (ref 96–106)
Creatinine, Ser: 0.65 mg/dL (ref 0.57–1.00)
Glucose: 96 mg/dL (ref 70–99)
Potassium: 3.7 mmol/L (ref 3.5–5.2)
Sodium: 139 mmol/L (ref 134–144)
eGFR: 109 mL/min/{1.73_m2} (ref >59)

## 2021-05-26 ENCOUNTER — Other Ambulatory Visit: Payer: Self-pay | Admitting: Family Medicine

## 2021-08-03 NOTE — Progress Notes (Signed)
PCP:  Vivi Barrack, MD Primary Cardiologist: None Electrophysiologist: Thompson Grayer, MD   Emily Maldonado is a 46 y.o. female seen today for Thompson Grayer, MD for routine electrophysiology followup.  Since last being seen in our clinic the patient reports doing OK. She feels her HR/palpitation once it hits the 90s. She is under a large amount of stress at work, but is transitioning and feels like this will get better. she denies chest pain, dyspnea, PND, orthopnea, nausea, vomiting, dizziness, syncope, edema, weight gain, or early satiety.  Past Medical History:  Diagnosis Date   A-fib ([place])    Abnormal Pap smear of cervix    Anemia    b12   Anxiety    B12 deficiency anemia    Childhood asthma    Former tobacco use    Hypertension    Hypothyroidism    Migraines    aura   Morbid obesity ([place])    Ovarian cyst    Sleep apnea    Use C-pap    Past Surgical History:  Procedure Laterality Date   ATRIAL FIBRILLATION ABLATION N/A 04/18/2020   Procedure: ATRIAL FIBRILLATION ABLATION;  Surgeon: Thompson Grayer, MD;  Location: [facility];  Service: Cardiovascular;  Laterality: N/A;   BREAST BIOPSY Left 2014   BREAST EXCISIONAL BIOPSY Right    CERVICAL BIOPSY  W/ LOOP ELECTRODE EXCISION     LEFT HEART CATH AND CORONARY ANGIOGRAPHY N/A 12/04/2017   Procedure: LEFT HEART CATH AND CORONARY ANGIOGRAPHY;  Surgeon: Burnell Blanks, MD;  Location: [facility];  Service: Cardiovascular;  Laterality: N/A;   TONSILLECTOMY     TUBAL LIGATION      Current Outpatient Medications  Medication Sig Dispense Refill   Cholecalciferol (VITAMIN D3) 250 MCG (10000 UT) capsule Take 10,000 Units by mouth daily.     cyanocobalamin (,VITAMIN B-12,) 1000 MCG/ML injection INJECT 1 ML INTO MUSCLE DAILY FOR 6 DAYS THEN WEEKLY FOR 1 MONTH THEN MONTHLY THEN 1 ML MONTHLY 6 mL 3   FERROUS SULFATE PO Take 220 mg by mouth daily.      furosemide (LASIX) 20 MG tablet Take 1 tablet (20 mg total) by  mouth daily. 30 tablet 11   gabapentin (NEURONTIN) 300 MG capsule TAKE 1 CAPSULE BY MOUTH TWICE A DAY 180 capsule 2   levothyroxine (SYNTHROID) 112 MCG tablet TAKE 1 TABLET EVERY DAY ON AN EMPTY STOMACH 90 tablet 1   magnesium oxide (MAG-OX) 400 MG tablet Take 400 mg by mouth 2 (two) times daily.      metoprolol succinate (TOPROL-XL) 100 MG 24 hr tablet TAKE 1 TABLET BY MOUTH EVERY DAY WITH OR IMMEDIATELY FOLLOWING A MEAL 90 tablet 3   metoprolol tartrate (LOPRESSOR) 25 MG tablet Take 1 tablet every 6 hours as needed for HR >100 60 tablet 1   omeprazole (PRILOSEC) 40 MG capsule Take 1 capsule (40 mg total) by mouth 2 (two) times daily. 60 capsule 3   potassium chloride SA (KLOR-CON) 20 MEQ tablet Take 1 tablet (20 mEq total) by mouth daily. 90 tablet 3   SYRINGE-NEEDLE, DISP, 3 ML 22G X 1-1/2" 3 ML MISC Use daily as directed. 50 each 0   spironolactone (ALDACTONE) 25 MG tablet Take 1 tablet (25 mg total) by mouth daily. 90 tablet 3   No current facility-administered medications for this visit.    Allergies  Allergen Reactions   Penicillins Anaphylaxis    Has patient had a PCN reaction causing immediate rash, facial/tongue/throat  swelling, SOB or lightheadedness with hypotension: /No Has patient had a PCN reaction causing severe rash involving mucus membranes or skin necrosis: No Has patient had a PCN reaction that required hospitalization: No Has patient had a PCN reaction occurring within the last 10 years: No If all of the above answers are "NO", then may proceed with Cephalosporin use.    Shellfish Allergy Anaphylaxis   Zithromax [Azithromycin] Rash    Social History   Socioeconomic History   Marital status: Single    Spouse name: Not on file   Number of children: Not on file   Years of education: Not on file   Highest education level: Not on file  Occupational History   Not on file  Tobacco Use   Smoking status: Every Day    Packs/day: 1.00    Types: Cigarettes    Smokeless tobacco: Never   Tobacco comments:    3/4 of pack  Vaping Use   Vaping Use: Never used  Substance and Sexual Activity   Alcohol use: Not Currently    Alcohol/week: 1.0 standard drink    Types: 1 Standard drinks or equivalent per week   Drug use: No   Sexual activity: Yes    Partners: Male    Birth control/protection: Surgical    Comment: btl  Other Topics Concern   Not on file  Social History Narrative   Not on file   Social Determinants of Health   Financial Resource Strain: Not on file  Food Insecurity: Not on file  Transportation Needs: Not on file  Physical Activity: Not on file  Stress: Not on file  Social Connections: Not on file  Intimate Partner Violence: Not on file     Review of Systems: All other systems reviewed and are otherwise negative except as noted above.  Physical Exam: Vitals:   08/06/21 0901  BP: 140/82  Pulse: 93  SpO2: 94%  Weight: 283 lb (128.4 kg)  Height: 5\' 5"  (1.651 m)    GEN- The patient is well appearing, alert and oriented x 3 today.   HEENT: normocephalic, atraumatic; sclera clear, conjunctiva pink; hearing intact; oropharynx clear; neck supple, no JVP Lymph- no cervical lymphadenopathy Lungs- Clear to ausculation bilaterally, normal work of breathing.  No wheezes, rales, rhonchi Heart- Regular rate and rhythm, no murmurs, rubs or gallops, PMI not laterally displaced GI- soft, non-tender, non-distended, bowel sounds present, no hepatosplenomegaly Extremities- no clubbing, cyanosis, or edema; DP/PT/radial pulses 2+ bilaterally MS- no significant deformity or atrophy Skin- warm and dry, no rash or lesion Psych- euthymic mood, full affect Neuro- strength and sensation are intact  EKG is not ordered.   Additional studies reviewed include: Previous EP office notes.   Assessment and Plan:  1. Paroxysmal atrial fibrillation/ atrial flutter Overall doing well s/p ablation CHA2DS2/VASc of at least 2.  She wishes to  remain off of [place], which is supported by current guidelines.  Off flecainide Feels some palpitations in 90-100s, but declines med changes or monitoring for now. High stress currently.    2. CPAP Encouraged nightly CPAP.    3. HTN Stable on current regimen    4. Obesity Body mass index is 47.09 kg/m.  Lifestyle modification is encouraged   5. Chronic diastolic CHF Echo [id number] LVEF 60-65%, Grade 1 DD. Otherwise stable.  Continue lasix 20mg  daily and KDur 20 meq daily Continue spiro 25 mg daily.  Can take extra lasix for a couple of days to get back to baseline.  Discussed importance  of sodium restriction   6. Hypothyroidism TSH WNL 03/19/2021   Follow up with EP APP in 6 months   Shirley Friar, [place]  08/06/21 9:17 AM

## 2021-08-06 ENCOUNTER — Other Ambulatory Visit: Payer: Self-pay

## 2021-08-06 ENCOUNTER — Encounter: Payer: Self-pay | Admitting: Student

## 2021-08-06 ENCOUNTER — Ambulatory Visit (INDEPENDENT_AMBULATORY_CARE_PROVIDER_SITE_OTHER): Payer: Commercial Managed Care - PPO | Admitting: Student

## 2021-08-06 VITALS — BP 140/82 | HR 93 | Ht 65.0 in | Wt 283.0 lb

## 2021-08-06 DIAGNOSIS — I480 Paroxysmal atrial fibrillation: Secondary | ICD-10-CM

## 2021-08-06 DIAGNOSIS — G4733 Obstructive sleep apnea (adult) (pediatric): Secondary | ICD-10-CM

## 2021-08-06 DIAGNOSIS — I10 Essential (primary) hypertension: Secondary | ICD-10-CM

## 2021-08-06 DIAGNOSIS — I483 Typical atrial flutter: Secondary | ICD-10-CM | POA: Diagnosis not present

## 2021-08-06 DIAGNOSIS — R0609 Other forms of dyspnea: Secondary | ICD-10-CM

## 2021-08-06 MED ORDER — METOPROLOL SUCCINATE ER 100 MG PO TB24: 100.0000 mg | ORAL_TABLET | Freq: Every day | ORAL | 3 refills | Status: DC

## 2021-08-06 MED ORDER — SPIRONOLACTONE 25 MG PO TABS: 25.0000 mg | ORAL_TABLET | Freq: Every day | ORAL | 3 refills | Status: DC

## 2021-08-06 MED ORDER — FUROSEMIDE 20 MG PO TABS: 20.0000 mg | ORAL_TABLET | Freq: Every day | ORAL | 3 refills | Status: DC

## 2021-08-06 NOTE — Patient Instructions (Signed)
Medication Instructions:  Your physician recommends that you continue on your current medications as directed. Please refer to the Current Medication list given to you today.  *If you need a refill on your cardiac medications before your next appointment, please call your pharmacy*   Lab Work: None If you have labs (blood work) drawn today and your tests are completely normal, you will receive your results only by: MyChart Message (if you have MyChart) OR A paper copy in the mail If you have any lab test that is abnormal or we need to change your treatment, we will call you to review the results.   Follow-Up: At [facility], you and your health needs are our priority.  As part of our continuing mission to provide you with exceptional heart care, we have created designated Provider Care Teams.  These Care Teams include your primary Cardiologist (physician) and Advanced Practice Providers (APPs -  Physician Assistants and Nurse Practitioners) who all work together to provide you with the care you need, when you need it.   Your next appointment:   6 month(s)  The format for your next appointment:   In Person  Provider:   You will see one of the following Advanced Practice Providers on your designated Care Team:   Renee Ursuy, PA-C Michael "Andy" Tillery, PA-C    

## 2021-09-22 ENCOUNTER — Other Ambulatory Visit: Payer: Self-pay | Admitting: Family Medicine

## 2021-10-23 ENCOUNTER — Other Ambulatory Visit: Payer: Self-pay | Admitting: Family Medicine

## 2021-11-05 ENCOUNTER — Other Ambulatory Visit: Payer: Self-pay | Admitting: Family Medicine

## 2021-11-08 DIAGNOSIS — M545 Low back pain, unspecified: Secondary | ICD-10-CM | POA: Diagnosis not present

## 2021-11-15 ENCOUNTER — Ambulatory Visit (INDEPENDENT_AMBULATORY_CARE_PROVIDER_SITE_OTHER): Payer: BC Managed Care – PPO | Admitting: Family Medicine

## 2021-11-15 ENCOUNTER — Encounter: Payer: Self-pay | Admitting: Family Medicine

## 2021-11-15 VITALS — BP 137/85 | HR 87 | Temp 98.2°F | Ht 65.0 in | Wt 266.4 lb

## 2021-11-15 DIAGNOSIS — R7989 Other specified abnormal findings of blood chemistry: Secondary | ICD-10-CM

## 2021-11-15 DIAGNOSIS — I483 Typical atrial flutter: Secondary | ICD-10-CM

## 2021-11-15 DIAGNOSIS — I480 Paroxysmal atrial fibrillation: Secondary | ICD-10-CM

## 2021-11-15 DIAGNOSIS — Z13220 Encounter for screening for lipoid disorders: Secondary | ICD-10-CM

## 2021-11-15 DIAGNOSIS — F17200 Nicotine dependence, unspecified, uncomplicated: Secondary | ICD-10-CM

## 2021-11-15 DIAGNOSIS — I10 Essential (primary) hypertension: Secondary | ICD-10-CM

## 2021-11-15 DIAGNOSIS — R739 Hyperglycemia, unspecified: Secondary | ICD-10-CM

## 2021-11-15 DIAGNOSIS — E039 Hypothyroidism, unspecified: Secondary | ICD-10-CM

## 2021-11-15 DIAGNOSIS — D510 Vitamin B12 deficiency anemia due to intrinsic factor deficiency: Secondary | ICD-10-CM | POA: Diagnosis not present

## 2021-11-15 DIAGNOSIS — Z23 Encounter for immunization: Secondary | ICD-10-CM

## 2021-11-15 DIAGNOSIS — Z1211 Encounter for screening for malignant neoplasm of colon: Secondary | ICD-10-CM

## 2021-11-15 DIAGNOSIS — Z0001 Encounter for general adult medical examination with abnormal findings: Secondary | ICD-10-CM | POA: Diagnosis not present

## 2021-11-15 DIAGNOSIS — M5430 Sciatica, unspecified side: Secondary | ICD-10-CM

## 2021-11-15 LAB — CBC
HCT: 48.3 % — ABNORMAL HIGH (ref 36.0–46.0)
Hemoglobin: 16.1 g/dL — ABNORMAL HIGH (ref 12.0–15.0)
MCHC: 33.3 g/dL (ref 30.0–36.0)
MCV: 88.8 fl (ref 78.0–100.0)
Platelets: 306 10*3/uL (ref 150.0–400.0)
RBC: 5.44 Mil/uL — ABNORMAL HIGH (ref 3.87–5.11)
RDW: 14.1 % (ref 11.5–15.5)
WBC: 13.5 10*3/uL — ABNORMAL HIGH (ref 4.0–10.5)

## 2021-11-15 LAB — COMPREHENSIVE METABOLIC PANEL
ALT: 18 U/L (ref 0–35)
AST: 14 U/L (ref 0–37)
Albumin: 4.6 g/dL (ref 3.5–5.2)
Alkaline Phosphatase: 77 U/L (ref 39–117)
BUN: 16 mg/dL (ref 6–23)
CO2: 28 mEq/L (ref 19–32)
Calcium: 9.7 mg/dL (ref 8.4–10.5)
Chloride: 101 mEq/L (ref 96–112)
Creatinine, Ser: 0.7 mg/dL (ref 0.40–1.20)
GFR: 102.64 mL/min (ref >60.00)
Glucose, Bld: 111 mg/dL — ABNORMAL HIGH (ref 70–99)
Potassium: 4.1 mEq/L (ref 3.5–5.1)
Sodium: 138 mEq/L (ref 135–145)
Total Bilirubin: 0.4 mg/dL (ref 0.2–1.2)
Total Protein: 7.9 g/dL (ref 6.0–8.3)

## 2021-11-15 LAB — LIPID PANEL
Cholesterol: 172 mg/dL (ref 0–200)
HDL: 29.3 mg/dL — ABNORMAL LOW (ref >39.00)
NonHDL: 142.24
Total CHOL/HDL Ratio: 6
Triglycerides: 234 mg/dL — ABNORMAL HIGH (ref 0.0–149.0)
VLDL: 46.8 mg/dL — ABNORMAL HIGH (ref 0.0–40.0)

## 2021-11-15 LAB — IBC + FERRITIN
Ferritin: 68.2 ng/mL (ref 10.0–291.0)
Iron: 60 ug/dL (ref 42–145)
Saturation Ratios: 17.9 % — ABNORMAL LOW (ref 20.0–50.0)
TIBC: 336 ug/dL (ref 250.0–450.0)
Transferrin: 240 mg/dL (ref 212.0–360.0)

## 2021-11-15 LAB — HEMOGLOBIN A1C: Hgb A1c MFr Bld: 5.9 % (ref 4.6–6.5)

## 2021-11-15 LAB — LDL CHOLESTEROL, DIRECT: Direct LDL: 123 mg/dL

## 2021-11-15 LAB — VITAMIN B12: Vitamin B-12: 465 pg/mL (ref 211–911)

## 2021-11-15 LAB — TSH: TSH: 0.57 u[IU]/mL (ref 0.35–5.50)

## 2021-11-15 LAB — VITAMIN D 25 HYDROXY (VIT D DEFICIENCY, FRACTURES): VITD: 81.52 ng/mL (ref 30.00–100.00)

## 2021-11-15 MED ORDER — "SYRINGE/NEEDLE (DISP) 22G X 1-1/2"" 3 ML MISC": 0 refills | Status: DC

## 2021-11-15 MED ORDER — BUPROPION HCL ER (XL) 150 MG PO TB24: 150.0000 mg | ORAL_TABLET | Freq: Every day | ORAL | 0 refills | Status: DC

## 2021-11-15 MED ORDER — VARENICLINE TARTRATE 0.5 MG PO TABS: ORAL_TABLET | ORAL | 0 refills | Status: DC

## 2021-11-15 MED ORDER — VARENICLINE TARTRATE 1 MG PO TABS: 1.0000 mg | ORAL_TABLET | Freq: Two times a day (BID) | ORAL | 0 refills | Status: DC

## 2021-11-15 NOTE — Assessment & Plan Note (Signed)
Follows with orthopedics. Currently finishing course of prednisone and will start mobic afterwards. She is on gabapentin 300mg  tid.  ?

## 2021-11-15 NOTE — Patient Instructions (Signed)
It was very nice to see you today! ? ?We will check blood work today. ? ?We will start Wellbutrin and Chantix.  Please let me know if you need further assistance with this. ? ?We will get your tetanus shot today. ? ?Please come back in 1 year for your next annual physical.  Please come back to see [place] sooner if needed. ? ?Take care, ?Dr Jerline Pain ? ?PLEASE NOTE: ? ?If you had any lab tests please let us know if you have not heard back within a few days. You may see your results on mychart before we have a chance to review them but we will give you a call once they are reviewed by [place]. If we ordered any referrals today, please let us know if you have not heard from their office within the next week.  ? ?Please try these tips to maintain a healthy lifestyle: ? ?Eat at least 3 REAL meals and 1-2 snacks per day.  Aim for no more than 5 hours between eating.  If you eat breakfast, please do so within one hour of getting up.  ? ?Each meal should contain half fruits/vegetables, one quarter protein, and one quarter carbs (no bigger than a computer mouse) ? ?Cut down on sweet beverages. This includes juice, soda, and sweet tea.  ? ?Drink at least 1 glass of water with each meal and aim for at least 8 glasses per day ? ?Exercise at least 150 minutes every week.   ? ?Preventive Care 48-32 Years Old, Female ?Preventive care refers to lifestyle choices and visits with your health care provider that can promote health and wellness. Preventive care visits are also called wellness exams. ?What can I expect for my preventive care visit? ?Counseling ?Your health care provider may ask you questions about your: ?Medical history, including: ?Past medical problems. ?Family medical history. ?Pregnancy history. ?Current health, including: ?Menstrual cycle. ?Method of birth control. ?Emotional well-being. ?Home life and relationship well-being. ?Sexual activity and sexual health. ?Lifestyle, including: ?Alcohol, nicotine or tobacco, and drug  use. ?Access to firearms. ?Diet, exercise, and sleep habits. ?Work and work Statistician. ?Sunscreen use. ?Safety issues such as seatbelt and bike helmet use. ?Physical exam ?Your health care provider will check your: ?Height and weight. These may be used to calculate your BMI (body mass index). BMI is a measurement that tells if you are at a healthy weight. ?Waist circumference. This measures the distance around your waistline. This measurement also tells if you are at a healthy weight and may help predict your risk of certain diseases, such as type 2 diabetes and high blood pressure. ?Heart rate and blood pressure. ?Body temperature. ?Skin for abnormal spots. ?What immunizations do I need? ? ?Vaccines are usually given at various ages, according to a schedule. Your health care provider will recommend vaccines for you based on your age, medical history, and lifestyle or other factors, such as travel or where you work. ?What tests do I need? ?Screening ?Your health care provider may recommend screening tests for certain conditions. This may include: ?Lipid and cholesterol levels. ?Diabetes screening. This is done by checking your blood sugar (glucose) after you have not eaten for a while (fasting). ?Pelvic exam and Pap test. ?Hepatitis B test. ?Hepatitis C test. ?HIV (human immunodeficiency virus) test. ?STI (sexually transmitted infection) testing, if you are at risk. ?Lung cancer screening. ?Colorectal cancer screening. ?Mammogram. Talk with your health care provider about when you should start having regular mammograms. This may depend on whether you  have a family history of breast cancer. ?BRCA-related cancer screening. This may be done if you have a family history of breast, ovarian, tubal, or peritoneal cancers. ?Bone density scan. This is done to screen for osteoporosis. ?Talk with your health care provider about your test results, treatment options, and if necessary, the need for more tests. ?Follow these  instructions at home: ?Eating and drinking ? ?Eat a diet that includes fresh fruits and vegetables, whole grains, lean protein, and low-fat dairy products. ?Take vitamin and mineral supplements as recommended by your health care provider. ?Do not drink alcohol if: ?Your health care provider tells you not to drink. ?You are pregnant, may be pregnant, or are planning to become pregnant. ?If you drink alcohol: ?Limit how much you have to 0-1 drink a day. ?Know how much alcohol is in your drink. In the [place] one drink equals one 12 oz bottle of beer (355 mL), one 5 oz glass of wine (148 mL), or one 1? oz glass of hard liquor (44 mL). ?Lifestyle ?Brush your teeth every morning and night with fluoride toothpaste. Floss one time each day. ?Exercise for at least 30 minutes 5 or more days each week. ?Do not use any products that contain nicotine or tobacco. These products include cigarettes, chewing tobacco, and vaping devices, such as e-cigarettes. If you need help quitting, ask your health care provider. ?Do not use drugs. ?If you are sexually active, practice safe sex. Use a condom or other form of protection to prevent STIs. ?If you do not wish to become pregnant, use a form of birth control. If you plan to become pregnant, see your health care provider for a prepregnancy visit. ?Take aspirin only as told by your health care provider. Make sure that you understand how much to take and what form to take. Work with your health care provider to find out whether it is safe and beneficial for you to take aspirin daily. ?Find healthy ways to manage stress, such as: ?Meditation, yoga, or listening to music. ?Journaling. ?Talking to a trusted person. ?Spending time with friends and family. ?Minimize exposure to UV radiation to reduce your risk of skin cancer. ?Safety ?Always wear your seat belt while driving or riding in a vehicle. ?Do not drive: ?If you have been drinking alcohol. Do not ride with someone who has been  drinking. ?When you are tired or distracted. ?While texting. ?If you have been using any mind-altering substances or drugs. ?Wear a helmet and other protective equipment during sports activities. ?If you have firearms in your house, make sure you follow all gun safety procedures. ?Seek help if you have been physically or sexually abused. ?What's next? ?Visit your health care provider once a year for an annual wellness visit. ?Ask your health care provider how often you should have your eyes and teeth checked. ?Stay up to date on all vaccines. ?This information is not intended to replace advice given to you by your health care provider. Make sure you discuss any questions you have with your health care provider. ?Document Revised: 12/20/2020 Document Reviewed: 12/20/2020 ?Elsevier Patient Education ? [place]. ? ?

## 2021-11-15 NOTE — Assessment & Plan Note (Signed)
Currently on B12 shots every 2 weeks. Will check labs today. May need to increase to weekly injections depending on her levels.  ?

## 2021-11-15 NOTE — Assessment & Plan Note (Signed)
Check TSH today.  She is on Synthroid 112 mcg daily. ?

## 2021-11-15 NOTE — Assessment & Plan Note (Signed)
Patient was asked about her tobacco use today and was strongly advised to quit. Patient is currently trying to quite. We reviewed treatment options to assist her quit smoking including NRT, Chantix, and Bupropion. Follow up at next office visit.  She has had bad reactions to Chantix in the past but has done well with combination of bupropion and Chantix.  We will restart today. ? ?Total time spent counseling approximately 3 minutes.  ? ?

## 2021-11-15 NOTE — Assessment & Plan Note (Signed)
Doing well status post ablation.  Continue management per cardiology. ?

## 2021-11-15 NOTE — Assessment & Plan Note (Signed)
Doing well status post ablation.  Continue management per cardiology. ?

## 2021-11-15 NOTE — Assessment & Plan Note (Signed)
At goal today on current regimen metoprolol succinate 100 mg daily, spironolactone 25 mg daily. ?

## 2021-11-15 NOTE — Progress Notes (Signed)
? ?Chief Complaint:  ?Emily Maldonado is a 46 y.o. female who presents today for her annual comprehensive physical exam.   ? ?Assessment/Plan:  ?Chronic Problems Addressed Today: ?Sciatica ?Follows with orthopedics. Currently finishing course of prednisone and will start mobic afterwards. She is on gabapentin 300mg  tid.  ? ?Typical atrial flutter ([place]) ?Doing well status post ablation. Continue management per cardiology.  ? ?Low vitamin D level ?Check vitamin D.  ? ?Pernicious anemia ?Currently on B12 shots every 2 weeks. Will check labs today. May need to increase to weekly injections depending on her levels.  ? ?Nicotine dependence with current use ?Patient was asked about her tobacco use today and was strongly advised to quit. Patient is currently trying to quite. We reviewed treatment options to assist her quit smoking including NRT, Chantix, and Bupropion. Follow up at next office visit.  She has had bad reactions to Chantix in the past but has done well with combination of bupropion and Chantix.  We will restart today. ? ?Total time spent counseling approximately 3 minutes.  ? ? ?Paroxysmal atrial fibrillation ([facility]) ?Doing well status post ablation.  Continue management per cardiology. ? ?Hypertension ?At goal today on current regimen metoprolol succinate 100 mg daily, spironolactone 25 mg daily. ? ?Hypothyroidism ?Check TSH today.  She is on Synthroid 112 mcg daily. ? ?Preventative Healthcare: ?Tdap given today.  Check labs.  She will follow-up with gynecology soon for Pap and mammogram. ? ?Patient Counseling(The following topics were reviewed and/or handout was given): ? -Nutrition: Stressed importance of moderation in sodium/caffeine intake, saturated fat and cholesterol, caloric balance, sufficient intake of fresh fruits, vegetables, and fiber. ? -Stressed the importance of regular exercise.  ? -Substance Abuse: Discussed cessation/primary prevention of tobacco, alcohol, or other drug use; driving or other  dangerous activities under the influence; availability of treatment for abuse.  ? -Injury prevention: Discussed safety belts, safety helmets, smoke detector, smoking near bedding or upholstery.  ? -Sexuality: Discussed sexually transmitted diseases, partner selection, use of condoms, avoidance of unintended pregnancy and contraceptive alternatives.  ? -Dental health: Discussed importance of regular tooth brushing, flossing, and dental visits. ? -Health maintenance and immunizations reviewed. Please refer to Health maintenance section. ? ?Return to care in 1 year for next preventative visit.  ? ?  ?Subjective:  ?HPI: ? ?She has no acute complaints today.  See A/P for status of chronic conditions. ? ? ?  06/20/2020  ?  3:28 PM  ?Depression screen PHQ 2/9  ?Decreased Interest 0  ?Down, Depressed, Hopeless 0  ?PHQ - 2 Score 0  ? ? ?Health Maintenance Due  ?Topic Date Due  ? COLONOSCOPY (Pts 45-51yrs Insurance coverage will need to be confirmed)  Never done  ?  ?ROS: Per HPI, otherwise a complete review of systems was negative.  ? ?PMH: ? ?The following were reviewed and entered/updated in epic: ?Past Medical History:  ?Diagnosis Date  ? A-fib ([place])   ? Abnormal Pap smear of cervix   ? Anemia   ? b12  ? Anxiety   ? B12 deficiency anemia   ? Childhood asthma   ? Former tobacco use   ? Hypertension   ? Hypothyroidism   ? Migraines   ? aura  ? Morbid obesity ([place])   ? Ovarian cyst   ? Sleep apnea   ? Use C-pap   ? ?Patient Active Problem List  ? Diagnosis Date Noted  ? Sciatica 11/15/2021  ? Pernicious anemia 11/15/2021  ? Typical atrial flutter ([place])  03/08/2020  ? GERD (gastroesophageal reflux disease) 10/19/2019  ? Low vitamin D level 01/18/2019  ? Nicotine dependence with current use 11/17/2018  ? Paroxysmal atrial fibrillation ([place]) 11/16/2018  ? 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  ? Pulmonary nodule - needs repeat 12/2018 12/19/2017  ? Low vitamin B12 level 12/05/2017  ? PVC's (premature ventricular  contractions)   ? Morbid obesity ([place])   ? Acne 08/22/2017  ? Hypothyroidism 08/22/2017  ? Hypertension 08/22/2017  ? ?Past Surgical History:  ?Procedure Laterality Date  ? ATRIAL FIBRILLATION ABLATION N/A 04/18/2020  ? Procedure: ATRIAL FIBRILLATION ABLATION;  Surgeon: Thompson Grayer, MD;  Location: [facility];  Service: Cardiovascular;  Laterality: N/A;  ? BREAST BIOPSY Left 2014  ? BREAST EXCISIONAL BIOPSY Right   ? CERVICAL BIOPSY  W/ LOOP ELECTRODE EXCISION    ? LEFT HEART CATH AND CORONARY ANGIOGRAPHY N/A 12/04/2017  ? Procedure: LEFT HEART CATH AND CORONARY ANGIOGRAPHY;  Surgeon: Burnell Blanks, MD;  Location: [facility];  Service: Cardiovascular;  Laterality: N/A;  ? TONSILLECTOMY    ? TUBAL LIGATION    ? ? ?Family History  ?Problem Relation Age of Onset  ? Breast cancer Mother 22  ? Heart failure Mother   ? Atrial fibrillation Mother   ? Cervical cancer Mother   ? Diabetes Mother   ? Hypertension Mother   ? Heart failure Father   ? Hypertension Father   ? Heart failure Maternal Grandmother   ? Atrial fibrillation Maternal Grandmother   ? Stroke Maternal Grandmother   ? Uterine cancer Maternal Grandmother   ? Diabetes Brother   ? Hypertension Brother   ? Uterine cancer Maternal Aunt   ? ? ?Medications- reviewed and updated ?Current Outpatient Medications  ?Medication Sig Dispense Refill  ? buPROPion (WELLBUTRIN XL) 150 MG 24 hr tablet Take 1 tablet (150 mg total) by mouth daily. 90 tablet 0  ? Cholecalciferol (VITAMIN D3) 250 MCG (10000 UT) capsule Take 10,000 Units by mouth daily.    ? cyanocobalamin (,VITAMIN B-12,) 1000 MCG/ML injection INJECT 1 ML INTO MUSCLE DAILY FOR 6 DAYS THEN WEEKLY FOR 1 MONTH THEN MONTHLY THEN 1 ML MONTHLY 6 mL 3  ? FERROUS SULFATE PO Take 220 mg by mouth daily.     ? furosemide (LASIX) 20 MG tablet Take 1 tablet (20 mg total) by mouth daily. 90 tablet 3  ? gabapentin (NEURONTIN) 300 MG capsule TAKE 1 CAPSULE BY MOUTH TWICE A DAY (Patient taking differently:  300 mg 3 (three) times daily.) 180 capsule 2  ? levothyroxine (SYNTHROID) 112 MCG tablet TAKE 1 TABLET EVERY DAY ON AN EMPTY STOMACH 90 tablet 1  ? magnesium oxide (MAG-OX) 400 MG tablet Take 400 mg by mouth 2 (two) times daily.     ? metoprolol succinate (TOPROL-XL) 100 MG 24 hr tablet Take 1 tablet (100 mg total) by mouth daily. Take with or immediately following a meal. 90 tablet 3  ? metoprolol tartrate (LOPRESSOR) 25 MG tablet Take 1 tablet every 6 hours as needed for HR >100 60 tablet 1  ? omeprazole (PRILOSEC) 40 MG capsule Take 1 capsule (40 mg total) by mouth 2 (two) times daily. 60 capsule 3  ? potassium chloride SA (KLOR-CON) 20 MEQ tablet Take 1 tablet (20 mEq total) by mouth daily. 90 tablet 3  ? varenicline (CHANTIX CONTINUING MONTH PAK) 1 MG tablet Take 1 tablet (1 mg total) by mouth 2 (two) times daily. 180 tablet 0  ? varenicline (  CHANTIX) 0.5 MG tablet Take 0.5mg  daily for 3 days, then 0.5mg  twice daily for 3 days, then 1mg  twice daily. 30 tablet 0  ? spironolactone (ALDACTONE) 25 MG tablet Take 1 tablet (25 mg total) by mouth daily. 90 tablet 3  ? SYRINGE-NEEDLE, DISP, 3 ML 22G X 1-1/2" 3 ML MISC Use daily as directed. 50 each 0  ? ?No current facility-administered medications for this visit.  ? ? ?Allergies-reviewed and updated ?Allergies  ?Allergen Reactions  ? Penicillins Anaphylaxis  ?  Has patient had a PCN reaction causing immediate rash, facial/tongue/throat swelling, SOB or lightheadedness with hypotension: /No ?Has patient had a PCN reaction causing severe rash involving mucus membranes or skin necrosis: No ?Has patient had a PCN reaction that required hospitalization: No ?Has patient had a PCN reaction occurring within the last 10 years: No ?If all of the above answers are "NO", then may proceed with Cephalosporin use. ?  ? Shellfish Allergy Anaphylaxis  ? Zithromax [Azithromycin] Rash  ? ? ?Social History  ? ?Socioeconomic History  ? Marital status: Single  ?  Spouse name: Not on file   ? Number of children: Not on file  ? Years of education: Not on file  ? Highest education level: Not on file  ?Occupational History  ? Not on file  ?Tobacco Use  ? Smoking status: Every Day  ?  Packs/day: 1.

## 2021-11-15 NOTE — Assessment & Plan Note (Signed)
Check vitamin D. 

## 2021-11-16 ENCOUNTER — Encounter: Payer: Self-pay | Admitting: Family Medicine

## 2021-11-16 DIAGNOSIS — R7303 Prediabetes: Secondary | ICD-10-CM | POA: Insufficient documentation

## 2021-11-16 DIAGNOSIS — E785 Hyperlipidemia, unspecified: Secondary | ICD-10-CM | POA: Insufficient documentation

## 2021-11-16 NOTE — Progress Notes (Signed)
Please inform patient of the following: ? ?Her white blood cell counts are elevated. I would like for her to come back in 1 to 2 weeks to recheck.  Please place future order for CBC.  Her iron levels are normal. ? ?Her cholesterol and blood sugar are both borderline elevated.  Do not eat start meds but she should continue to work on diet and exercise and we can recheck in a year. ? ?Thyroid levels normal.  Vitamin D and B12 levels are normal.  Everything else is normal. ?

## 2021-11-21 ENCOUNTER — Other Ambulatory Visit: Payer: Self-pay

## 2021-11-21 DIAGNOSIS — D72829 Elevated white blood cell count, unspecified: Secondary | ICD-10-CM

## 2021-11-21 MED ORDER — CYANOCOBALAMIN 1000 MCG/ML IJ SOLN: INTRAMUSCULAR | 3 refills | Status: DC

## 2021-11-21 MED ORDER — LEVOTHYROXINE SODIUM 112 MCG PO TABS: ORAL_TABLET | ORAL | 1 refills | Status: DC

## 2021-11-21 NOTE — Progress Notes (Signed)
Ok to refill her B12. She should stay on her current dose. ? ?Emily Maldonado. [name], MD ?11/21/2021 10:36 AM  ?

## 2021-11-22 ENCOUNTER — Other Ambulatory Visit: Payer: Self-pay | Admitting: Family Medicine

## 2021-11-29 DIAGNOSIS — Z124 Encounter for screening for malignant neoplasm of cervix: Secondary | ICD-10-CM | POA: Diagnosis not present

## 2021-11-29 DIAGNOSIS — Z6841 Body Mass Index (BMI) 40.0 and over, adult: Secondary | ICD-10-CM | POA: Diagnosis not present

## 2021-11-29 DIAGNOSIS — L0291 Cutaneous abscess, unspecified: Secondary | ICD-10-CM | POA: Diagnosis not present

## 2021-11-29 DIAGNOSIS — N6459 Other signs and symptoms in breast: Secondary | ICD-10-CM | POA: Diagnosis not present

## 2021-11-29 DIAGNOSIS — Z1231 Encounter for screening mammogram for malignant neoplasm of breast: Secondary | ICD-10-CM | POA: Diagnosis not present

## 2021-11-29 DIAGNOSIS — Z1151 Encounter for screening for human papillomavirus (HPV): Secondary | ICD-10-CM | POA: Diagnosis not present

## 2021-11-29 DIAGNOSIS — Z01419 Encounter for gynecological examination (general) (routine) without abnormal findings: Secondary | ICD-10-CM | POA: Diagnosis not present

## 2021-11-29 DIAGNOSIS — N920 Excessive and frequent menstruation with regular cycle: Secondary | ICD-10-CM | POA: Diagnosis not present

## 2022-02-02 ENCOUNTER — Other Ambulatory Visit: Payer: Self-pay | Admitting: Family Medicine

## 2022-02-28 ENCOUNTER — Other Ambulatory Visit: Payer: Self-pay | Admitting: Internal Medicine

## 2022-02-28 MED ORDER — POTASSIUM CHLORIDE CRYS ER 20 MEQ PO TBCR: 20.0000 meq | EXTENDED_RELEASE_TABLET | Freq: Every day | ORAL | 1 refills | Status: AC

## 2022-03-15 ENCOUNTER — Encounter: Payer: Self-pay | Admitting: Family Medicine

## 2022-03-28 ENCOUNTER — Encounter: Payer: Self-pay | Admitting: Family Medicine

## 2022-03-28 ENCOUNTER — Ambulatory Visit: Payer: BC Managed Care – PPO | Admitting: Family Medicine

## 2022-03-28 VITALS — BP 128/82 | HR 85 | Temp 97.8°F | Ht 65.0 in | Wt 273.8 lb

## 2022-03-28 DIAGNOSIS — L709 Acne, unspecified: Secondary | ICD-10-CM

## 2022-03-28 DIAGNOSIS — K219 Gastro-esophageal reflux disease without esophagitis: Secondary | ICD-10-CM

## 2022-03-28 DIAGNOSIS — M5430 Sciatica, unspecified side: Secondary | ICD-10-CM | POA: Diagnosis not present

## 2022-03-28 DIAGNOSIS — R7303 Prediabetes: Secondary | ICD-10-CM

## 2022-03-28 MED ORDER — SEMAGLUTIDE-WEIGHT MANAGEMENT 0.25 MG/0.5ML ~~LOC~~ SOAJ: 0.2500 mg | SUBCUTANEOUS | 0 refills | Status: AC

## 2022-03-28 MED ORDER — DOXYCYCLINE HYCLATE 100 MG PO TABS: 100.0000 mg | ORAL_TABLET | Freq: Every day | ORAL | 1 refills | Status: DC

## 2022-03-28 MED ORDER — MEDROXYPROGESTERONE ACETATE 10 MG PO TABS: 10.0000 mg | ORAL_TABLET | Freq: Every day | ORAL | 0 refills | Status: DC

## 2022-03-28 MED ORDER — MUPIROCIN 2 % EX OINT: 1.0000 | TOPICAL_OINTMENT | Freq: Two times a day (BID) | CUTANEOUS | 0 refills | Status: DC

## 2022-03-28 MED ORDER — OMEPRAZOLE 40 MG PO CPDR: 40.0000 mg | DELAYED_RELEASE_CAPSULE | Freq: Every day | ORAL | 3 refills | Status: DC

## 2022-03-28 MED ORDER — SEMAGLUTIDE-WEIGHT MANAGEMENT 0.5 MG/0.5ML ~~LOC~~ SOAJ: 0.5000 mg | SUBCUTANEOUS | 0 refills | Status: AC

## 2022-03-28 MED ORDER — PREDNISONE 20 MG PO TABS: 40.0000 mg | ORAL_TABLET | Freq: Every day | ORAL | 0 refills | Status: DC

## 2022-03-28 MED ORDER — GABAPENTIN 300 MG PO CAPS
ORAL_CAPSULE | ORAL | 3 refills | Status: DC
Start: 2022-03-28 — End: 2022-10-11

## 2022-03-28 NOTE — Patient Instructions (Signed)
It was very nice to see you today!  I will send in a prescription for doxycycline mupirocin.  Please start the Provera to help you with your uterine bleeding.  Please start the [facility].  Take 0.25 mg weekly for 4 weeks and then increase to 0.5 mg weekly.  Send a message in a few weeks limit how this is working.  Please increase your gabapentin to 600 mg in the morning and after milligrams at night.  I will also send in more prednisone to help you with this.  Send me a message in a few weeks to let me know how you are doing.   Take care, Dr Jerline Pain  PLEASE NOTE:  If you had any lab tests please let us know if you have not heard back within a few days. You may see your results on mychart before we have a chance to review them but we will give you a call once they are reviewed by [place]. If we ordered any referrals today, please let us know if you have not heard from their office within the next week.   Please try these tips to maintain a healthy lifestyle:  Eat at least 3 REAL meals and 1-2 snacks per day.  Aim for no more than 5 hours between eating.  If you eat breakfast, please do so within one hour of getting up.   Each meal should contain half fruits/vegetables, one quarter protein, and one quarter carbs (no bigger than a computer mouse)  Cut down on sweet beverages. This includes juice, soda, and sweet tea.   Drink at least 1 glass of water with each meal and aim for at least 8 glasses per day  Exercise at least 150 minutes every week.

## 2022-03-28 NOTE — Assessment & Plan Note (Signed)
BMI today is 45 with comorbidities.  She is interested in prescription weight loss management.  She would like to try [facility].  States that her insurance will cover this.  We discussed side effects.  We will start 0.25 mg weekly for 4 weeks and then increase to 0.5 mg weekly and increase the dose as tolerated.  She will follow-up in a couple weeks via [place].

## 2022-03-28 NOTE — Progress Notes (Signed)
   Emily Maldonado is a 46 y.o. female who presents today for an office visit.  Assessment/Plan:  New/Acute Problems: Uterine Bleeding She will be going on vacation soon.  She wishes to have medication to prevent uterine bleeding.  She is not currently having breathing.  She has been on Provera for similar issue in the past which worked well.  We will get refill today.  We discussed potential side effects.    Chronic Problems Addressed Today: Acne Not controlled grossly had a course of doxycycline with some modest improvement.  She would like to be restarted on long-term treatment.  She has done well with this in the past.  She will follow-up with dermatology soon we will start 3 to 17-month course of doxycycline 100 mg daily.  Prediabetes We will starting [facility].  We can recheck A1c at next office visit.  Sciatica Still has not controlled.  She is followed with orthopedics.  We will increase her gabapentin to 600 mg in the morning and 900 mg at night.Recently had prednisone prescribed by [facility].  We will give another short burst today.  Morbid obesity ([place]) BMI today is 45 with comorbidities.  She is interested in prescription weight loss management.  She would like to try [facility].  States that her insurance will cover this.  We discussed side effects.  We will start 0.25 mg weekly for 4 weeks and then increase to 0.5 mg weekly and increase the dose as tolerated.  She will follow-up in a couple weeks via [place].  GERD (gastroesophageal reflux disease) Omeprazole refilled.     Subjective:  HPI:  See A/p for status of chronic conditions.         Objective:  Physical Exam: BP 128/82   Pulse 85   Temp 97.8 F (36.6 C) (Temporal)   Ht 5\' 5"  (1.651 m)   Wt 273 lb 12.8 oz (124.2 kg)   LMP 03/09/2022   SpO2 95%   BMI 45.56 kg/m   Gen: No acute distress, resting comfortably Neuro: Grossly normal, moves all extremities Psych: Normal affect and thought content      Deaundra Kutzer M.  Jerline Pain, MD 03/28/2022 1:34 PM

## 2022-03-28 NOTE — Assessment & Plan Note (Signed)
Omeprazole refilled.

## 2022-03-28 NOTE — Assessment & Plan Note (Signed)
Still has not controlled.  She is followed with orthopedics.  We will increase her gabapentin to 600 mg in the morning and 900 mg at night.Recently had prednisone prescribed by [facility].  We will give another short burst today.

## 2022-03-28 NOTE — Assessment & Plan Note (Signed)
We will starting [facility].  We can recheck A1c at next office visit.

## 2022-03-28 NOTE — Assessment & Plan Note (Signed)
Not controlled grossly had a course of doxycycline with some modest improvement.  She would like to be restarted on long-term treatment.  She has done well with this in the past.  She will follow-up with dermatology soon we will start 3 to 41-month course of doxycycline 100 mg daily.

## 2022-03-29 ENCOUNTER — Other Ambulatory Visit: Payer: Self-pay | Admitting: Family Medicine

## 2022-04-08 ENCOUNTER — Telehealth: Payer: Self-pay | Admitting: *Deleted

## 2022-04-08 NOTE — Telephone Encounter (Signed)
(  [facility]) Rx #: [id number] AXENMM 0.25MG /0.5ML auto-injector Waiting for determination

## 2022-04-12 NOTE — Telephone Encounter (Signed)
Your request for prior authorization was denied, Appeal and records faxed to 1 [phone]

## 2022-04-29 ENCOUNTER — Encounter: Payer: Self-pay | Admitting: Family Medicine

## 2022-05-02 NOTE — Telephone Encounter (Signed)
Caller States: -Case Number [id number] -Wegovy 0.25MG   -Has questions  Call back number [phone]  [facility] fax was provided - will fax questions but a call back would be appreciated.

## 2022-05-06 ENCOUNTER — Telehealth: Payer: Self-pay | Admitting: Family Medicine

## 2022-05-06 NOTE — Telephone Encounter (Signed)
Anderson Malta B from [employer] clinical appeal calling re [phone] .25 mg  Please call [phone]  Case ID [id number]   Needs call back with answer by 10/31

## 2022-05-09 NOTE — Telephone Encounter (Signed)
(  Key: [id number]) BJSEGB 0.25MG /0.5ML auto-injectors Appeal resend today Waiting for determination

## 2022-05-15 NOTE — Telephone Encounter (Signed)
Use this form if the prior authorization for the medication has been denied by the insurance.

## 2022-05-18 ENCOUNTER — Other Ambulatory Visit: Payer: Self-pay | Admitting: Family Medicine

## 2022-05-21 NOTE — Telephone Encounter (Signed)
Caller states: - They received appeal for patient's wegovy but needs a couple of questions answered by either PCP or PCP's clinical team  - They will be faxing the questions over since a nurse was unavailable at time of call  - They need an answer by 05/22/22  For further questions Victorino Dike can be reached at [phone]. Case number is [id number]

## 2022-05-22 NOTE — Telephone Encounter (Signed)
Victorino Dike B from [employer] call for more information for PA Rx [phone]  Information given stated will send for review for determination

## 2022-05-23 NOTE — Telephone Encounter (Signed)
Spoke with insurance on 05/23/2022 Will send letter with determination

## 2022-06-17 ENCOUNTER — Encounter (HOSPITAL_COMMUNITY): Payer: Self-pay

## 2022-06-17 ENCOUNTER — Ambulatory Visit (HOSPITAL_COMMUNITY)
Admission: EM | Admit: 2022-06-17 | Discharge: 2022-06-17 | Disposition: A | Discharge status: Home / Self Care | Payer: BC Managed Care – PPO | Attending: Family Medicine | Admitting: Family Medicine

## 2022-06-17 DIAGNOSIS — J069 Acute upper respiratory infection, unspecified: Secondary | ICD-10-CM

## 2022-06-17 DIAGNOSIS — J4521 Mild intermittent asthma with (acute) exacerbation: Secondary | ICD-10-CM

## 2022-06-17 MED ORDER — ALBUTEROL SULFATE (2.5 MG/3ML) 0.083% IN NEBU
2.5000 mg | INHALATION_SOLUTION | Freq: Once | RESPIRATORY_TRACT | Status: AC
  Administered 2022-06-17: 2.5 mg via RESPIRATORY_TRACT

## 2022-06-17 MED ORDER — PREDNISONE 20 MG PO TABS: 40.0000 mg | ORAL_TABLET | Freq: Every day | ORAL | 0 refills | Status: AC

## 2022-06-17 MED ORDER — ALBUTEROL SULFATE (2.5 MG/3ML) 0.083% IN NEBU
INHALATION_SOLUTION | RESPIRATORY_TRACT | Status: AC
  Filled 2022-06-17: qty 3

## 2022-06-17 MED ORDER — CHERATUSSIN AC 100-10 MG/5ML PO SOLN: 5.0000 mL | Freq: Four times a day (QID) | ORAL | 0 refills | Status: DC | PRN

## 2022-06-17 MED ORDER — ALBUTEROL SULFATE HFA 108 (90 BASE) MCG/ACT IN AERS: 2.0000 | INHALATION_SPRAY | RESPIRATORY_TRACT | 0 refills | Status: AC | PRN

## 2022-06-17 MED ORDER — KETOROLAC TROMETHAMINE 60 MG/2ML IM SOLN: 30.0000 mg | Freq: Once | INTRAMUSCULAR | Status: DC

## 2022-06-17 NOTE — Discharge Instructions (Signed)
Albuterol inhaler--do 2 puffs every 4 hours as needed for shortness of breath or wheezing  Take prednisone 20 mg--2 daily for 5 days  -Robitussin with codeine-5 mL 4 times a day as needed for cough.   You have been swabbed for COVID and flu, and the test will result in the next 24 hours. Our staff will call you if positive. If the COVID test is positive, you should quarantine for 5 days from the start of your symptoms

## 2022-06-17 NOTE — ED Triage Notes (Signed)
Pt is here for sore throat, cough, bilateral ear pain, fever, post nasal drip, diarrhea, headache, chills x few days

## 2022-06-17 NOTE — ED Provider Notes (Addendum)
[facility]    CSN: [id number] Arrival date & time: 06/17/22  0859      History   Chief Complaint Chief Complaint  Patient presents with   Shortness of Breath   Cough   Headache   Otalgia   Sore Throat   Fever   Diarrhea    HPI Emily Maldonado is a 46 y.o. female.    Shortness of Breath Associated symptoms: cough, ear pain, fever and headaches   Cough Associated symptoms: ear pain, fever, headaches and shortness of breath   Headache Associated symptoms: cough, diarrhea, ear pain and fever   Otalgia Associated symptoms: cough, diarrhea, fever and headaches   Sore Throat Associated symptoms include headaches and shortness of breath.  Fever Associated symptoms: cough, diarrhea, ear pain and headaches   Diarrhea Associated symptoms: fever and headaches    Here for cough and shortness of breath and wheezing and nasal congestion and rhinorrhea.  Symptoms began on December 6.  She had subjective fever the first couple of days and also ate the first day.  She is also had some sore throat and bilateral ear pain.  Past Medical History:  Diagnosis Date   A-fib ([place])    Abnormal Pap smear of cervix    Anemia    b12   Anxiety    B12 deficiency anemia    Childhood asthma    Former tobacco use    Hypertension    Hypothyroidism    Migraines    aura   Morbid obesity ([place])    Ovarian cyst    Sleep apnea    Use C-pap     Patient Active Problem List   Diagnosis Date Noted   Dyslipidemia 11/16/2021   Prediabetes 11/16/2021   Sciatica 11/15/2021   Pernicious anemia 11/15/2021   Typical atrial flutter ([place]) 03/08/2020   GERD (gastroesophageal reflux disease) 10/19/2019   Low vitamin D level 01/18/2019   Nicotine dependence with current use 11/17/2018   Paroxysmal atrial fibrillation ([place]) 11/16/2018   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   Pulmonary nodule - needs repeat 12/2018 12/19/2017   Low vitamin B12 level 12/05/2017   PVC's (premature  ventricular contractions)    Morbid obesity ([place])    Acne 08/22/2017   Hypothyroidism 08/22/2017   Hypertension 08/22/2017    Past Surgical History:  Procedure Laterality Date   ATRIAL FIBRILLATION ABLATION N/A 04/18/2020   Procedure: ATRIAL FIBRILLATION ABLATION;  Surgeon: Thompson Grayer, MD;  Location: [facility];  Service: Cardiovascular;  Laterality: N/A;   BREAST BIOPSY Left 2014   BREAST EXCISIONAL BIOPSY Right    CERVICAL BIOPSY  W/ LOOP ELECTRODE EXCISION     LEFT HEART CATH AND CORONARY ANGIOGRAPHY N/A 12/04/2017   Procedure: LEFT HEART CATH AND CORONARY ANGIOGRAPHY;  Surgeon: Burnell Blanks, MD;  Location: [facility];  Service: Cardiovascular;  Laterality: N/A;   TONSILLECTOMY     TUBAL LIGATION      OB History     Gravida  5   Para      Term      Preterm      AB  2   Living  3      SAB  1   IAB  1   Ectopic      Multiple      Live Births  3            Home Medications    Prior to Admission medications  Medication Sig Start Date End Date Taking? Authorizing Provider  albuterol (VENTOLIN HFA) 108 (90 Base) MCG/ACT inhaler Inhale 2 puffs into the lungs every 4 (four) hours as needed for wheezing or shortness of breath. 06/17/22  Yes Tineka Uriegas, Gwenlyn Perking, MD  guaiFENesin-codeine (CHERATUSSIN AC) 100-10 MG/5ML syrup Take 5 mLs by mouth 4 (four) times daily as needed for cough. 06/17/22  Yes Barrett Henle, MD  predniSONE (DELTASONE) 20 MG tablet Take 2 tablets (40 mg total) by mouth daily with breakfast for 5 days. 06/17/22 06/22/22 Yes Barrett Henle, MD  B-D 5CC LUER-LOK SYR 22GX1-1/2 22G X 1-1/2" 5 ML MISC USE DAILY AS DIRECTED. 03/29/22   Vivi Barrack, MD  Cholecalciferol (VITAMIN D3) 250 MCG (10000 UT) capsule Take 10,000 Units by mouth daily.    [provider]  cyanocobalamin (,VITAMIN B-12,) 1000 MCG/ML injection INJECT 1 ML INTO MUSCLE DAILY FOR 6 DAYS THEN WEEKLY FOR 1 MONTH THEN MONTHLY THEN 1 ML  MONTHLY 11/21/21   Vivi Barrack, MD  FERROUS SULFATE PO Take 220 mg by mouth daily.     [provider]  furosemide (LASIX) 20 MG tablet Take 1 tablet (20 mg total) by mouth daily. 08/06/21   Shirley Friar, PA-C  gabapentin (NEURONTIN) 300 MG capsule 62m in the morning and 9067mat night. 03/28/22   PaVivi BarrackMD  levothyroxine (SYNTHROID) 112 MCG tablet TAKE 1 TABLET EVERY DAY ON AN EMPTY STOMACH 05/20/22   PaVivi BarrackMD  magnesium oxide (MAG-OX) 400 MG tablet Take 400 mg by mouth 2 (two) times daily.     [provider]  medroxyPROGESTERone (PROVERA) 10 MG tablet Take 1 tablet (10 mg total) by mouth daily. 03/28/22   PaVivi BarrackMD  metoprolol succinate (TOPROL-XL) 100 MG 24 hr tablet Take 1 tablet (100 mg total) by mouth daily. Take with or immediately following a meal. 08/06/21   Tillery, MiSatira MccallumPA-C  metoprolol tartrate (LOPRESSOR) 25 MG tablet Take 1 tablet every 6 hours as needed for HR >100 07/19/20   Fenton, Clint R, PA  mupirocin ointment (BACTROBAN) 2 % Apply 1 Application topically 2 (two) times daily. 03/28/22   PaVivi BarrackMD  omeprazole (PRILOSEC) 40 MG capsule Take 1 capsule (40 mg total) by mouth daily. 03/28/22   PaVivi BarrackMD  potassium chloride SA (KLOR-CON M) 20 MEQ tablet Take 1 tablet (20 mEq total) by mouth daily. 02/28/22   TiShirley FriarPA-C  spironolactone (ALDACTONE) 25 MG tablet Take 1 tablet (25 mg total) by mouth daily. 08/06/21 11/04/21  TiShirley FriarPA-C  SYRINGE-NEEDLE, DISP, 3 ML 22G X 1-1/2" 3 ML MISC Use daily as directed. 11/15/21   PaVivi BarrackMD  flecainide (TAMBOCOR) 50 MG tablet TAKE 1 TABLET BY MOUTH TWICE A DAY Patient not taking: No sig reported 10/20/19 11/08/19  FeOliver BarrePA    Family History Family History  Problem Relation Age of Onset   Breast cancer Mother 4731 Heart failure Mother    Atrial fibrillation Mother    Cervical cancer Mother    Diabetes  Mother    Hypertension Mother    Heart failure Father    Hypertension Father    Heart failure Maternal Grandmother    Atrial fibrillation Maternal Grandmother    Stroke Maternal Grandmother    Uterine cancer Maternal Grandmother    Diabetes Brother    Hypertension Brother    Uterine cancer Maternal  Aunt     Social History Social History   Tobacco Use   Smoking status: Every Day    Packs/day: 1.00    Types: Cigarettes   Smokeless tobacco: Never   Tobacco comments:    3/4 of pack  Vaping Use   Vaping Use: Never used  Substance Use Topics   Alcohol use: Not Currently    Alcohol/week: 1.0 standard drink of alcohol    Types: 1 Standard drinks or equivalent per week   Drug use: No     Allergies   Penicillins, Shellfish allergy, and Zithromax [azithromycin]   Review of Systems Review of Systems  Constitutional:  Positive for fever.  HENT:  Positive for ear pain.   Respiratory:  Positive for cough and shortness of breath.   Gastrointestinal:  Positive for diarrhea.  Neurological:  Positive for headaches.     Physical Exam Triage Vital Signs ED Triage Vitals  Enc Vitals Group     BP 06/17/22 1136 134/83     Pulse Rate 06/17/22 1136 91     Resp 06/17/22 1136 18     Temp 06/17/22 1136 98.4 F (36.9 C)     Temp Source 06/17/22 1136 Oral     SpO2 06/17/22 1136 92 %     Weight --      Height --      Head Circumference --      Peak Flow --      Pain Score 06/17/22 1134 7     Pain Loc --      Pain Edu? --      Excl. in [place]? --    No data found.  Updated Vital Signs BP 134/83 (BP Location: Left Arm)   Pulse 91   Temp 98.4 F (36.9 C) (Oral)   Resp 18   LMP 06/09/2022   SpO2 92%   Visual Acuity Right Eye Distance:   Left Eye Distance:   Bilateral Distance:    Right Eye Near:   Left Eye Near:    Bilateral Near:     Physical Exam Vitals reviewed.  Constitutional:      General: She is not in acute distress.    Appearance: She is not ill-appearing,  toxic-appearing or diaphoretic.  HENT:     Right Ear: Tympanic membrane and ear canal normal.     Left Ear: Tympanic membrane and ear canal normal.     Nose: Congestion present.     Mouth/Throat:     Mouth: Mucous membranes are moist.     Comments: There is clear and white mucus draining.  Mild erythema of the posterior oropharynx Eyes:     Extraocular Movements: Extraocular movements intact.     Conjunctiva/sclera: Conjunctivae normal.     Pupils: Pupils are equal, round, and reactive to light.  Cardiovascular:     Rate and Rhythm: Normal rate and regular rhythm.     Heart sounds: No murmur heard. Pulmonary:     Effort: Pulmonary effort is normal. No respiratory distress.     Breath sounds: No stridor. No rhonchi or rales.     Comments: She has bilateral inspiratory and expiratory wheezes Musculoskeletal:     Cervical back: Neck supple.  Lymphadenopathy:     Cervical: No cervical adenopathy.  Skin:    Capillary Refill: Capillary refill takes less than 2 seconds.     Coloration: Skin is not jaundiced or pale.  Neurological:     General: No focal deficit present.  Mental Status: She is alert and oriented to person, place, and time.  Psychiatric:        Behavior: Behavior normal.      UC Treatments / Results  Labs (all labs ordered are listed, but only abnormal results are displayed) Labs Reviewed  RESP PANEL BY RT-PCR (FLU A&B, COVID) ARPGX2    EKG   Radiology No results found.  Procedures Procedures (including critical care time)  Medications Ordered in UC Medications  ketorolac (TORADOL) injection 30 mg (has no administration in time range)  albuterol (PROVENTIL) (2.5 MG/3ML) 0.083% nebulizer solution 2.5 mg (2.5 mg Nebulization Given 06/17/22 1206)    Initial Impression / Assessment and Plan / UC Course  I have reviewed the triage vital signs and the nursing notes.  Pertinent labs & imaging results that were available during my care of the patient were  reviewed by me and considered in my medical decision making (see chart for details).           After the albuterol nebulization, her exam was normal with clear lung sounds.  She is swabbed for COVID and flu.  She is past the time where Tamiflu would be helpful if it were flu positive.  If she is COVID-positive she is a candidate for Paxlovid, as her last EGFR was 102  Prednisone is sent in for asthma exacerbation, as is an albuterol.  Also codeine cough syrup is sent in as she cannot get any sleep due to the cough. Final Clinical Impressions(s) / UC Diagnoses   Final diagnoses:  Mild intermittent asthma with acute exacerbation  Viral URI with cough     Discharge Instructions      Albuterol inhaler--do 2 puffs every 4 hours as needed for shortness of breath or wheezing  Take prednisone 20 mg--2 daily for 5 days  -Robitussin with codeine-5 mL 4 times a day as needed for cough.   You have been swabbed for COVID and flu, and the test will result in the next 24 hours. Our staff will call you if positive. If the COVID test is positive, you should quarantine for 5 days from the start of your symptoms      ED Prescriptions     Medication Sig Dispense Auth. Provider   albuterol (VENTOLIN HFA) 108 (90 Base) MCG/ACT inhaler Inhale 2 puffs into the lungs every 4 (four) hours as needed for wheezing or shortness of breath. 1 each Barrett Henle, MD   predniSONE (DELTASONE) 20 MG tablet Take 2 tablets (40 mg total) by mouth daily with breakfast for 5 days. 10 tablet Aleila Syverson, Gwenlyn Perking, MD   guaiFENesin-codeine (CHERATUSSIN AC) 100-10 MG/5ML syrup Take 5 mLs by mouth 4 (four) times daily as needed for cough. 120 mL Barrett Henle, MD      I have reviewed the PDMP during this encounter.   Barrett Henle, MD 06/17/22 1223    Barrett Henle, MD 06/17/22 1225    Barrett Henle, MD 06/17/22 1226

## 2022-08-15 ENCOUNTER — Other Ambulatory Visit: Payer: Self-pay | Admitting: Student

## 2022-09-13 ENCOUNTER — Other Ambulatory Visit: Payer: Self-pay | Admitting: Student

## 2022-09-17 ENCOUNTER — Other Ambulatory Visit: Payer: Self-pay | Admitting: Family Medicine

## 2022-09-21 ENCOUNTER — Other Ambulatory Visit: Payer: Self-pay | Admitting: Student

## 2022-09-25 ENCOUNTER — Other Ambulatory Visit: Payer: Self-pay | Admitting: Student

## 2022-09-26 ENCOUNTER — Other Ambulatory Visit: Payer: Self-pay | Admitting: Student

## 2022-10-07 ENCOUNTER — Other Ambulatory Visit: Payer: Self-pay | Admitting: Student

## 2022-10-11 ENCOUNTER — Other Ambulatory Visit: Payer: Self-pay | Admitting: *Deleted

## 2022-10-11 MED ORDER — LEVOTHYROXINE SODIUM 112 MCG PO TABS: ORAL_TABLET | ORAL | 3 refills | Status: DC

## 2022-10-11 MED ORDER — GABAPENTIN 300 MG PO CAPS: ORAL_CAPSULE | ORAL | 0 refills | Status: DC

## 2022-10-11 MED ORDER — OMEPRAZOLE 40 MG PO CPDR: 40.0000 mg | DELAYED_RELEASE_CAPSULE | Freq: Every day | ORAL | 0 refills | Status: DC

## 2022-10-15 ENCOUNTER — Other Ambulatory Visit: Payer: Self-pay | Admitting: *Deleted

## 2022-10-15 MED ORDER — SPIRONOLACTONE 25 MG PO TABS: 25.0000 mg | ORAL_TABLET | Freq: Every day | ORAL | 0 refills | Status: AC

## 2022-10-15 MED ORDER — FUROSEMIDE 20 MG PO TABS: 20.0000 mg | ORAL_TABLET | Freq: Every day | ORAL | 0 refills | Status: AC

## 2022-10-15 MED ORDER — METOPROLOL SUCCINATE ER 100 MG PO TB24: 100.0000 mg | ORAL_TABLET | Freq: Every day | ORAL | 0 refills | Status: DC

## 2022-10-17 ENCOUNTER — Ambulatory Visit: Payer: BC Managed Care – PPO | Admitting: Family Medicine

## 2022-10-17 ENCOUNTER — Encounter: Payer: Self-pay | Admitting: Family Medicine

## 2022-10-17 VITALS — BP 124/78 | HR 90 | Temp 98.2°F | Ht 65.0 in | Wt 269.0 lb

## 2022-10-17 DIAGNOSIS — N951 Menopausal and female climacteric states: Secondary | ICD-10-CM | POA: Insufficient documentation

## 2022-10-17 DIAGNOSIS — I10 Essential (primary) hypertension: Secondary | ICD-10-CM | POA: Diagnosis not present

## 2022-10-17 DIAGNOSIS — M5430 Sciatica, unspecified side: Secondary | ICD-10-CM

## 2022-10-17 DIAGNOSIS — R7303 Prediabetes: Secondary | ICD-10-CM | POA: Diagnosis not present

## 2022-10-17 DIAGNOSIS — L709 Acne, unspecified: Secondary | ICD-10-CM

## 2022-10-17 DIAGNOSIS — E785 Hyperlipidemia, unspecified: Secondary | ICD-10-CM

## 2022-10-17 DIAGNOSIS — J309 Allergic rhinitis, unspecified: Secondary | ICD-10-CM

## 2022-10-17 DIAGNOSIS — L02411 Cutaneous abscess of right axilla: Secondary | ICD-10-CM | POA: Diagnosis not present

## 2022-10-17 DIAGNOSIS — E039 Hypothyroidism, unspecified: Secondary | ICD-10-CM | POA: Diagnosis not present

## 2022-10-17 DIAGNOSIS — R7989 Other specified abnormal findings of blood chemistry: Secondary | ICD-10-CM

## 2022-10-17 DIAGNOSIS — I480 Paroxysmal atrial fibrillation: Secondary | ICD-10-CM

## 2022-10-17 DIAGNOSIS — L732 Hidradenitis suppurativa: Secondary | ICD-10-CM | POA: Diagnosis not present

## 2022-10-17 LAB — COMPREHENSIVE METABOLIC PANEL
ALT: 16 U/L (ref 0–35)
AST: 14 U/L (ref 0–37)
Albumin: 4.2 g/dL (ref 3.5–5.2)
Alkaline Phosphatase: 80 U/L (ref 39–117)
BUN: 9 mg/dL (ref 6–23)
CO2: 27 mEq/L (ref 19–32)
Calcium: 9.4 mg/dL (ref 8.4–10.5)
Chloride: 104 mEq/L (ref 96–112)
Creatinine, Ser: 0.62 mg/dL (ref 0.40–1.20)
GFR: 105 mL/min (ref >60.00)
Glucose, Bld: 95 mg/dL (ref 70–99)
Potassium: 3.9 mEq/L (ref 3.5–5.1)
Sodium: 139 mEq/L (ref 135–145)
Total Bilirubin: 0.5 mg/dL (ref 0.2–1.2)
Total Protein: 7.2 g/dL (ref 6.0–8.3)

## 2022-10-17 LAB — CBC
HCT: 45.9 % (ref 36.0–46.0)
Hemoglobin: 15.7 g/dL — ABNORMAL HIGH (ref 12.0–15.0)
MCHC: 34.3 g/dL (ref 30.0–36.0)
MCV: 85.1 fl (ref 78.0–100.0)
Platelets: 228 10*3/uL (ref 150.0–400.0)
RBC: 5.4 Mil/uL — ABNORMAL HIGH (ref 3.87–5.11)
RDW: 13.9 % (ref 11.5–15.5)
WBC: 8.1 10*3/uL (ref 4.0–10.5)

## 2022-10-17 LAB — TSH: TSH: 1.67 u[IU]/mL (ref 0.35–5.50)

## 2022-10-17 LAB — HEMOGLOBIN A1C: Hgb A1c MFr Bld: 6 % (ref 4.6–6.5)

## 2022-10-17 MED ORDER — LEVOTHYROXINE SODIUM 112 MCG PO TABS: ORAL_TABLET | ORAL | 3 refills | Status: DC

## 2022-10-17 MED ORDER — PREDNISONE 50 MG PO TABS: ORAL_TABLET | ORAL | 0 refills | Status: DC

## 2022-10-17 MED ORDER — AZELASTINE HCL 0.1 % NA SOLN: 2.0000 | Freq: Two times a day (BID) | NASAL | 12 refills | Status: AC

## 2022-10-17 MED ORDER — OMEPRAZOLE 40 MG PO CPDR: 40.0000 mg | DELAYED_RELEASE_CAPSULE | Freq: Every day | ORAL | 3 refills | Status: DC

## 2022-10-17 MED ORDER — GABAPENTIN 300 MG PO CAPS: ORAL_CAPSULE | ORAL | 3 refills | Status: DC

## 2022-10-17 NOTE — Patient Instructions (Signed)
It was very nice to see you today!  We will check blood work today.  I will refill your medications.  I will send in a small supply of prednisone to use if needed if your sciatica flares up while in [place].  I will refer you to see sports medicine.  Will see you back in 6 to 12 months.  Please come back to see [place] sooner if needed.  Take care, Dr [name]  PLEASE NOTE:  If you had any lab tests, please let us know if you have not heard back within a few days. You may see your results on mychart before we have a chance to review them but we will give you a call once they are reviewed by [place].   If we ordered any referrals today, please let us know if you have not heard from their office within the next week.   If you had any urgent prescriptions sent in today, please check with the pharmacy within an hour of our visit to make sure the prescription was transmitted appropriately.   Please try these tips to maintain a healthy lifestyle:  Eat at least 3 REAL meals and 1-2 snacks per day.  Aim for no more than 5 hours between eating.  If you eat breakfast, please do so within one hour of getting up.   Each meal should contain half fruits/vegetables, one quarter protein, and one quarter carbs (no bigger than a computer mouse)  Cut down on sweet beverages. This includes juice, soda, and sweet tea.   Drink at least 1 glass of water with each meal and aim for at least 8 glasses per day  Exercise at least 150 minutes every week.

## 2022-10-17 NOTE — Assessment & Plan Note (Signed)
She has completed 80-month course of doxycycline still has ongoing issues.  She does have an initial visit with dermatology later today.  Will defer management to them.  Also has an abscess in right axilla that she will discuss with them today as well.

## 2022-10-17 NOTE — Assessment & Plan Note (Signed)
Still has longer any issues with sciatica.  Symptoms come and go.  She has previously seen chiropractic and they are working on relieving pressure from her piriformis muscle.  She thinks this is helped modestly.  She is also on gabapentin 600 mg in the morning and 900 mg at night which works well.  She has previously been recommended to get an MRI however she is reluctant to do this at this point due to cost concerns.  She is interested in seeing sports medicine will place referral today.  We did discuss referral to PT however she deferred for now.  She does have an upcoming trip to [place] and is concerned about where.  She has previously done well with prednisone and would like to have a prescription to have on hand.  Given that she has done well with this in the past, it would be reasonable for her to have this for her trip.  Will send in prescription today.

## 2022-10-17 NOTE — Assessment & Plan Note (Signed)
Too early to recheck lipids today.  Will recheck next blood draw.

## 2022-10-17 NOTE — Assessment & Plan Note (Signed)
Check A1c. 

## 2022-10-17 NOTE — Assessment & Plan Note (Signed)
At goal on metoprolol succinate 100 mg daily and spironolactone 25 mg daily.  Will check labs today.  Medications are prescribed by cardiology.

## 2022-10-17 NOTE — Assessment & Plan Note (Signed)
On Synthroid 112 mcg daily.  Check TSH. 

## 2022-10-17 NOTE — Assessment & Plan Note (Signed)
Doing well status post ablation.  Follows with cardiology.  Regular rate and rhythm today.

## 2022-10-17 NOTE — Assessment & Plan Note (Signed)
Patient has not had a period for the last 2 to 3 months.  She is also having some mood swings and vasomotor symptoms.  She does have a gynecologist however they told her there is no indication for hormone testing at this point.  Not a candidate for hormone replacement due to smoking.  We did discuss SSRIs however she deferred for now.

## 2022-10-17 NOTE — Progress Notes (Signed)
Emily Maldonado is a 47 y.o. female who presents today for an office visit.  Assessment/Plan:  Chronic Problems Addressed Today: Acne She has completed 5972-month course of doxycycline still has ongoing issues.  She does have an initial visit with dermatology later today.  Will defer management to them.  Also has an abscess in right axilla that she will discuss with them today as well.  Hypothyroidism On Synthroid 112 mcg daily.  Check TSH.  Hypertension At goal on metoprolol succinate 100 mg daily and spironolactone 25 mg daily.  Will check labs today.  Medications are prescribed by cardiology.  Sciatica Still has longer any issues with sciatica.  Symptoms come and go.  She has previously seen chiropractic and they are working on relieving pressure from her piriformis muscle.  She thinks this is helped modestly.  She is also on gabapentin 600 mg in the morning and 900 mg at night which works well.  She has previously been recommended to get an MRI however she is reluctant to do this at this point due to cost concerns.  She is interested in seeing sports medicine will place referral today.  We did discuss referral to PT however she deferred for now.  [place] and is concerned about where.  She has previously done well with prednisone and would like to have a prescription to have on hand.  Given that she has done well with this in the past, it would be reasonable for her to have this for her trip.  Will send in prescription today.  Dyslipidemia Too early to recheck lipids today.  Will recheck next blood draw.  Prediabetes Check A1c.  Perimenopause Patient has not had a period for the last 2 to 3 months.  She is also having some mood swings and vasomotor symptoms.  She does have a gynecologist however they told her there is no indication for hormone testing at this point.  Not a candidate for hormone replacement due to smoking.  We did discuss SSRIs however she  deferred for now.  Allergic rhinitis Worsened recently.  Does have some fullness in bilateral ears.  She has done well with Astelin in the past.  Will refill today.  Paroxysmal atrial fibrillation ([facility]) Doing well status post ablation.  Follows with cardiology.  Regular rate and rhythm today.     Subjective:  HPI:  See A/p for status of chronic conditions.  She needs refill on her medications.   PMH:  The following were reviewed and entered/updated in epic: Past Medical History:  Diagnosis Date   A-fib    Abnormal Pap smear of cervix    Anemia    b12   Anxiety    B12 deficiency anemia    Childhood asthma    Former tobacco use    Hypertension    Hypothyroidism    Migraines    aura   Morbid obesity    Ovarian cyst    Sleep apnea    Use C-pap    Patient Active Problem List   Diagnosis Date Noted   Perimenopause 10/17/2022   Allergic rhinitis 10/17/2022   Dyslipidemia 11/16/2021   Prediabetes 11/16/2021   Sciatica 11/15/2021   Pernicious anemia 11/15/2021   Typical atrial flutter 03/08/2020   GERD (gastroesophageal reflux disease) 10/19/2019   Low vitamin D level 01/18/2019   Nicotine dependence with current use 11/17/2018   Paroxysmal atrial fibrillation 11/16/2018   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  Pulmonary nodule - needs repeat 12/2018 12/19/2017   Low vitamin B12 level 12/05/2017   PVC's (premature ventricular contractions)    Morbid obesity    Acne 08/22/2017   Hypothyroidism 08/22/2017   Hypertension 08/22/2017   Past Surgical History:  Procedure Laterality Date   ATRIAL FIBRILLATION ABLATION N/A 04/18/2020   Procedure: ATRIAL FIBRILLATION ABLATION;  Surgeon: Hillis Range, MD;  Location: [facility];  Service: Cardiovascular;  Laterality: N/A;   BREAST BIOPSY Left 2014   BREAST EXCISIONAL BIOPSY Right    CERVICAL BIOPSY  W/ LOOP ELECTRODE EXCISION     LEFT HEART CATH AND CORONARY ANGIOGRAPHY N/A 12/04/2017   Procedure: LEFT  HEART CATH AND CORONARY ANGIOGRAPHY;  Surgeon: Kathleene Hazel, MD;  Location: [facility];  Service: Cardiovascular;  Laterality: N/A;   TONSILLECTOMY     TUBAL LIGATION      Family History  Problem Relation Age of Onset   Breast cancer Mother 53   Heart failure Mother    Atrial fibrillation Mother    Cervical cancer Mother    Diabetes Mother    Hypertension Mother    Heart failure Father    Hypertension Father    Heart failure Maternal Grandmother    Atrial fibrillation Maternal Grandmother    Stroke Maternal Grandmother    Uterine cancer Maternal Grandmother    Diabetes Brother    Hypertension Brother    Uterine cancer Maternal Aunt     Medications- reviewed and updated Current Outpatient Medications  Medication Sig Dispense Refill   albuterol (VENTOLIN HFA) 108 (90 Base) MCG/ACT inhaler Inhale 2 puffs into the lungs every 4 (four) hours as needed for wheezing or shortness of breath. 1 each 0   azelastine (ASTELIN) 0.1 % nasal spray Place 2 sprays into both nostrils 2 (two) times daily. 30 mL 12   B-D 5CC LUER-LOK SYR 22GX1-1/2 22G X 1-1/2" 5 ML MISC USE DAILY AS DIRECTED. 30 each 1   Cholecalciferol (VITAMIN D3) 250 MCG (10000 UT) capsule Take 10,000 Units by mouth daily.     cyanocobalamin (,VITAMIN B-12,) 1000 MCG/ML injection INJECT 1 ML INTO MUSCLE DAILY FOR 6 DAYS THEN WEEKLY FOR 1 MONTH THEN MONTHLY THEN 1 ML MONTHLY 6 mL 3   doxycycline (VIBRA-TABS) 100 MG tablet TAKE 1 TABLET BY MOUTH EVERY DAY 30 tablet 5   FERROUS SULFATE PO Take 220 mg by mouth daily.      furosemide (LASIX) 20 MG tablet Take 1 tablet (20 mg total) by mouth daily. PLEASE CONTACT OUR OFFICE TO SCHEDULE AN OVERDUE OFFICE VISIT FOR FUTURE REFILLS. [phone]. THANK YOU. LAST AND FINAL ATTEMPT 15 tablet 0   magnesium oxide (MAG-OX) 400 MG tablet Take 400 mg by mouth 2 (two) times daily.      metoprolol succinate (TOPROL-XL) 100 MG 24 hr tablet Take 1 tablet (100 mg total) by mouth  daily. 15 tablet 0   metoprolol tartrate (LOPRESSOR) 25 MG tablet Take 1 tablet every 6 hours as needed for HR >100 60 tablet 1   mupirocin ointment (BACTROBAN) 2 % Apply 1 Application topically 2 (two) times daily. 22 g 0   potassium chloride SA (KLOR-CON M) 20 MEQ tablet Take 1 tablet (20 mEq total) by mouth daily. 90 tablet 1   predniSONE (DELTASONE) 50 MG tablet Take 1 tablet daily for 5 days. 5 tablet 0   spironolactone (ALDACTONE) 25 MG tablet Take 1 tablet (25 mg total) by mouth daily. PLEASE CONTACT OUR OFFICE TO SCHEDULE  AN OVERDUE OFFICE VISIT FOR FUTURE REFILLS. [phone]. THANK YOU. LAST AND FINAL ATTEMPT 15 tablet 0   SYRINGE-NEEDLE, DISP, 3 ML 22G X 1-1/2" 3 ML MISC Use daily as directed. 50 each 0   gabapentin (NEURONTIN) 300 MG capsule 600mg  in the morning and 900mg  at night. 450 capsule 3   levothyroxine (SYNTHROID) 112 MCG tablet TAKE 1 TABLET EVERY DAY ON AN EMPTY STOMACH 90 tablet 3   omeprazole (PRILOSEC) 40 MG capsule Take 1 capsule (40 mg total) by mouth daily. 90 capsule 3   No current facility-administered medications for this visit.    Allergies-reviewed and updated Allergies  Allergen Reactions   Penicillins Anaphylaxis    Has patient had a PCN reaction causing immediate rash, facial/tongue/throat swelling, SOB or lightheadedness with hypotension: /No Has patient had a PCN reaction causing severe rash involving mucus membranes or skin necrosis: No Has patient had a PCN reaction that required hospitalization: No Has patient had a PCN reaction occurring within the last 10 years: No If all of the above answers are "NO", then may proceed with Cephalosporin use.    Shellfish Allergy Anaphylaxis   Zithromax [Azithromycin] Rash    Social History   Socioeconomic History   Marital status: Single    Spouse name: Not on file   Number of children: Not on file   Years of education: Not on file   Highest education level: Not on file  Occupational History   Not on  file  Tobacco Use   Smoking status: Every Day    Packs/day: 1    Types: Cigarettes   Smokeless tobacco: Never   Tobacco comments:    3/4 of pack  Vaping Use   Vaping Use: Never used  Substance and Sexual Activity   Alcohol use: Not Currently    Alcohol/week: 1.0 standard drink of alcohol    Types: 1 Standard drinks or equivalent per week   Drug use: No   Sexual activity: Yes    Partners: Male    Birth control/protection: Surgical    Comment: btl  Other Topics Concern   Not on file  Social History Narrative   Not on file   Social Determinants of Health   Financial Resource Strain: Not on file  Food Insecurity: Not on file  Transportation Needs: Not on file  Physical Activity: Not on file  Stress: Not on file  Social Connections: Not on file        Objective:  Physical Exam: BP 124/78   Pulse 90   Temp 98.2 F (36.8 C) (Temporal)   Ht 5\' 5"  (1.651 m)   Wt 269 lb (122 kg)   SpO2 98%   BMI 44.76 kg/m   Gen: No acute distress, resting comfortably HEENT: Bilateral TMs with clear effusion. CV: Regular rate and rhythm with no murmurs appreciated Pulm: Normal work of breathing, clear to auscultation bilaterally with no crackles, wheezes, or rhonchi Neuro: Grossly normal, moves all extremities Psych: Normal affect and thought content      Indigo Chaddock M. [name], MD 10/17/2022 7:56 AM

## 2022-10-17 NOTE — Assessment & Plan Note (Signed)
Worsened recently.  Does have some fullness in bilateral ears.  She has done well with Astelin in the past.  Will refill today.

## 2022-10-21 NOTE — Progress Notes (Signed)
Please inform patient of the following:  Her blood sugar is in the borderline diabetic range.  This is stable compared to her value about a year ago.  Do not need to start meds but she should work on diet and exercise and we can recheck in a year.  Her hemoglobin is a little bit elevated but this is also stable compared to her previous values.  This is due to her smoking and should improve when she gets down or stop smoking.  Everything else is normal and we can recheck in a year.  Emily Maldonado. [name], MD 10/21/2022 7:55 AM

## 2022-10-22 NOTE — Telephone Encounter (Signed)
Ok for [place] to send in both rx.  Katina Degree. [name], MD 10/22/2022 1:32 PM

## 2022-10-23 ENCOUNTER — Other Ambulatory Visit: Payer: Self-pay

## 2022-10-23 MED ORDER — METOPROLOL SUCCINATE ER 100 MG PO TB24: 100.0000 mg | ORAL_TABLET | Freq: Every day | ORAL | 1 refills | Status: DC

## 2022-10-23 MED ORDER — CYANOCOBALAMIN 1000 MCG/ML IJ SOLN: INTRAMUSCULAR | 0 refills | Status: AC

## 2022-10-23 MED ORDER — CYANOCOBALAMIN 1000 MCG/ML IJ SOLN: INTRAMUSCULAR | 0 refills | Status: DC

## 2022-12-05 DIAGNOSIS — L732 Hidradenitis suppurativa: Secondary | ICD-10-CM | POA: Diagnosis not present

## 2023-01-15 ENCOUNTER — Ambulatory Visit
Admission: EM | Admit: 2023-01-15 | Discharge: 2023-01-15 | Disposition: A | Discharge status: Home / Self Care | Payer: BC Managed Care – PPO | Attending: Internal Medicine | Admitting: Internal Medicine

## 2023-01-15 ENCOUNTER — Encounter: Payer: Self-pay | Admitting: Emergency Medicine

## 2023-01-15 DIAGNOSIS — S31000A Unspecified open wound of lower back and pelvis without penetration into retroperitoneum, initial encounter: Secondary | ICD-10-CM

## 2023-01-15 DIAGNOSIS — L03312 Cellulitis of back [any part except buttock]: Secondary | ICD-10-CM | POA: Diagnosis not present

## 2023-01-15 MED ORDER — SULFAMETHOXAZOLE-TRIMETHOPRIM 800-160 MG PO TABS: 1.0000 | ORAL_TABLET | Freq: Two times a day (BID) | ORAL | 0 refills | Status: AC

## 2023-01-15 NOTE — ED Provider Notes (Signed)
[facility]    CSN: [id number] Arrival date & time: 01/15/23  1817      History   Chief Complaint Chief Complaint  Patient presents with   Abscess    HPI Emily Maldonado is a 47 y.o. female.   Patient presents to urgent care for evaluation of possible insect/spider bite to the left lower back that she first noticed a couple of weeks ago. She does not recall specific trauma, injury, or bite to the left lower back and states this wound is not similar to her typical HS abscesses. Wound started out as a "pimple", then grew to an abscess which she was able to drain herself at home. States the redness, swelling, and drainage to the wound have improved significantly after doxycycline BID for the last 7 days but wound continues to drain purulent drainage and has lots of swelling/redness. No recent fever, chills, body aches, dizziness, nausea, or vomiting. She is pre-diabetic. She is doing warm compresses to the wound to promote drainage and states this has helped but it is still infected and not getting better.      Past Medical History:  Diagnosis Date   A-fib (HCC)    Abnormal Pap smear of cervix    Anemia    b12   Anxiety    B12 deficiency anemia    Childhood asthma    Former tobacco use    Hypertension    Hypothyroidism    Migraines    aura   Morbid obesity (HCC)    Ovarian cyst    Sleep apnea    Use C-pap     Patient Active Problem List   Diagnosis Date Noted   Perimenopause 10/17/2022   Allergic rhinitis 10/17/2022   Dyslipidemia 11/16/2021   Prediabetes 11/16/2021   Sciatica 11/15/2021   Pernicious anemia 11/15/2021   Typical atrial flutter (HCC) 03/08/2020   GERD (gastroesophageal reflux disease) 10/19/2019   Low vitamin D level 01/18/2019   Nicotine dependence with current use 11/17/2018   Paroxysmal atrial fibrillation (HCC) 11/16/2018   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   Pulmonary nodule - needs repeat 12/2018 12/19/2017   Low  vitamin B12 level 12/05/2017   PVC's (premature ventricular contractions)    Morbid obesity (HCC)    Acne 08/22/2017   Hypothyroidism 08/22/2017   Hypertension 08/22/2017    Past Surgical History:  Procedure Laterality Date   ATRIAL FIBRILLATION ABLATION N/A 04/18/2020   Procedure: ATRIAL FIBRILLATION ABLATION;  Surgeon: Hillis Range, MD;  Location: [facility];  Service: Cardiovascular;  Laterality: N/A;   BREAST BIOPSY Left 2014   BREAST EXCISIONAL BIOPSY Right    CERVICAL BIOPSY  W/ LOOP ELECTRODE EXCISION     LEFT HEART CATH AND CORONARY ANGIOGRAPHY N/A 12/04/2017   Procedure: LEFT HEART CATH AND CORONARY ANGIOGRAPHY;  Surgeon: Kathleene Hazel, MD;  Location: [facility];  Service: Cardiovascular;  Laterality: N/A;   TONSILLECTOMY     TUBAL LIGATION      OB History     Gravida  5   Para      Term      Preterm      AB  2   Living  3      SAB  1   IAB  1   Ectopic      Multiple      Live Births  3            Home Medications  Prior to Admission medications   Medication Sig Start Date End Date Taking? Authorizing Provider  sulfamethoxazole-trimethoprim (BACTRIM DS) 800-160 MG tablet Take 1 tablet by mouth 2 (two) times daily for 7 days. 01/15/23 01/22/23 Yes Sabree Nuon, Donavan Burnet, FNP  albuterol (VENTOLIN HFA) 108 (90 Base) MCG/ACT inhaler Inhale 2 puffs into the lungs every 4 (four) hours as needed for wheezing or shortness of breath. 06/17/22   Zenia Resides, MD  azelastine (ASTELIN) 0.1 % nasal spray Place 2 sprays into both nostrils 2 (two) times daily. 10/17/22   Ardith Dark, MD  B-D 5CC LUER-LOK SYR 22GX1-1/2 22G X 1-1/2" 5 ML MISC USE DAILY AS DIRECTED. 03/29/22   Ardith Dark, MD  Cholecalciferol (VITAMIN D3) 250 MCG (10000 UT) capsule Take 10,000 Units by mouth daily.    [provider]  cyanocobalamin (VITAMIN B12) 1000 MCG/ML injection INJECT 1 ML INTO MUSCLE MONTHLY 10/23/22   Ardith Dark, MD   FERROUS SULFATE PO Take 220 mg by mouth daily.     [provider]  furosemide (LASIX) 20 MG tablet Take 1 tablet (20 mg total) by mouth daily. PLEASE CONTACT OUR OFFICE TO SCHEDULE AN OVERDUE OFFICE VISIT FOR FUTURE REFILLS. [phone]. THANK YOU. LAST AND FINAL ATTEMPT 10/15/22   Graciella Freer, PA-C  gabapentin (NEURONTIN) 300 MG capsule 600mg  in the morning and 900mg  at night. 10/17/22   Ardith Dark, MD  levothyroxine (SYNTHROID) 112 MCG tablet TAKE 1 TABLET EVERY DAY ON AN EMPTY STOMACH 10/17/22   Ardith Dark, MD  magnesium oxide (MAG-OX) 400 MG tablet Take 400 mg by mouth 2 (two) times daily.     [provider]  metoprolol succinate (TOPROL-XL) 100 MG 24 hr tablet Take 1 tablet (100 mg total) by mouth daily. 10/23/22   Ardith Dark, MD  metoprolol tartrate (LOPRESSOR) 25 MG tablet Take 1 tablet every 6 hours as needed for HR >100 07/19/20   Fenton, Clint R, PA  mupirocin ointment (BACTROBAN) 2 % Apply 1 Application topically 2 (two) times daily. 03/28/22   Ardith Dark, MD  omeprazole (PRILOSEC) 40 MG capsule Take 1 capsule (40 mg total) by mouth daily. 10/17/22   Ardith Dark, MD  potassium chloride SA (KLOR-CON M) 20 MEQ tablet Take 1 tablet (20 mEq total) by mouth daily. 02/28/22   Graciella Freer, PA-C  predniSONE (DELTASONE) 50 MG tablet Take 1 tablet daily for 5 days. 10/17/22   Ardith Dark, MD  spironolactone (ALDACTONE) 25 MG tablet Take 1 tablet (25 mg total) by mouth daily. PLEASE CONTACT OUR OFFICE TO SCHEDULE AN OVERDUE OFFICE VISIT FOR FUTURE REFILLS. [phone]. THANK YOU. LAST AND FINAL ATTEMPT 10/15/22   Graciella Freer, PA-C  SYRINGE-NEEDLE, DISP, 3 ML 22G X 1-1/2" 3 ML MISC Use daily as directed. 11/15/21   Ardith Dark, MD  flecainide (TAMBOCOR) 50 MG tablet TAKE 1 TABLET BY MOUTH TWICE A DAY Patient not taking: No sig reported 10/20/19 11/08/19  Danice Goltz, PA    Family History Family History  Problem Relation  Age of Onset   Breast cancer Mother 27   Heart failure Mother    Atrial fibrillation Mother    Cervical cancer Mother    Diabetes Mother    Hypertension Mother    Heart failure Father    Hypertension Father    Heart failure Maternal Grandmother    Atrial fibrillation Maternal Grandmother    Stroke Maternal Grandmother    Uterine  cancer Maternal Grandmother    Diabetes Brother    Hypertension Brother    Uterine cancer Maternal Aunt     Social History Social History   Tobacco Use   Smoking status: Every Day    Packs/day: 1    Types: Cigarettes   Smokeless tobacco: Never   Tobacco comments:    3/4 of pack  Vaping Use   Vaping Use: Never used  Substance Use Topics   Alcohol use: Not Currently    Alcohol/week: 1.0 standard drink of alcohol    Types: 1 Standard drinks or equivalent per week   Drug use: No     Allergies   Penicillins, Shellfish allergy, and Zithromax [azithromycin]   Review of Systems Review of Systems Per HPI  Physical Exam Triage Vital Signs ED Triage Vitals  Enc Vitals Group     BP 01/15/23 1932 115/70     Pulse Rate 01/15/23 1932 83     Resp 01/15/23 1932 18     Temp 01/15/23 1932 98.3 F (36.8 C)     Temp Source 01/15/23 1932 Oral     SpO2 01/15/23 1932 92 %     Weight --      Height --      Head Circumference --      Peak Flow --      Pain Score 01/15/23 1931 0     Pain Loc --      Pain Edu? --      Excl. in GC? --    No data found.  Updated Vital Signs BP 115/70 (BP Location: Left Arm)   Pulse 83   Temp 98.3 F (36.8 C) (Oral)   Resp 18   SpO2 92%   Visual Acuity Right Eye Distance:   Left Eye Distance:   Bilateral Distance:    Right Eye Near:   Left Eye Near:    Bilateral Near:     Physical Exam Vitals and nursing note reviewed.  Constitutional:      Appearance: She is not ill-appearing or toxic-appearing.  HENT:     Head: Normocephalic and atraumatic.     Right Ear: Hearing and external ear normal.     Left  Ear: Hearing and external ear normal.     Nose: Nose normal.     Mouth/Throat:     Lips: Pink.  Eyes:     General: Lids are normal. Vision grossly intact. Gaze aligned appropriately.     Extraocular Movements: Extraocular movements intact.     Conjunctiva/sclera: Conjunctivae normal.  Pulmonary:     Effort: Pulmonary effort is normal.  Musculoskeletal:     Cervical back: Neck supple.  Skin:    General: Skin is warm and dry.     Capillary Refill: Capillary refill takes less than 2 seconds.     Findings: Wound present. No rash.       Neurological:     General: No focal deficit present.     Mental Status: She is alert and oriented to person, place, and time. Mental status is at baseline.     Cranial Nerves: No dysarthria or facial asymmetry.  Psychiatric:        Mood and Affect: Mood normal.        Speech: Speech normal.        Behavior: Behavior normal.        Thought Content: Thought content normal.        Judgment: Judgment normal.  UC Treatments / Results  Labs (all labs ordered are listed, but only abnormal results are displayed) Labs Reviewed - No data to display  EKG   Radiology No results found.  Procedures Procedures (including critical care time)  Medications Ordered in UC Medications - No data to display  Initial Impression / Assessment and Plan / UC Course  I have reviewed the triage vital signs and the nursing notes.  Pertinent labs & imaging results that were available during my care of the patient were reviewed by me and considered in my medical decision making (see chart for details).     *** Final Clinical Impressions(s) / UC Diagnoses   Final diagnoses:  Open wound of lower back  Cellulitis of lower back     Discharge Instructions      Take bactrim antibiotic twice daily for 7 days. Continue warm compresses. You may find that Epsom salt soaks may be helpful for this as well. If you develop fever, chills, nausea, vomiting,  body aches, worsening drainage from the site, or worsening redness/swelling, please go to the nearest emergency department for further evaluation.   Schedule a follow-up appointment with your primary care provider in the next 3 to 5 days.   ED Prescriptions     Medication Sig Dispense Auth. Provider   sulfamethoxazole-trimethoprim (BACTRIM DS) 800-160 MG tablet Take 1 tablet by mouth 2 (two) times daily for 7 days. 14 tablet Carlisle Beers, FNP      PDMP not reviewed this encounter.

## 2023-01-15 NOTE — Discharge Instructions (Addendum)
Take bactrim antibiotic twice daily for 7 days. Continue warm compresses. You may find that Epsom salt soaks may be helpful for this as well. If you develop fever, chills, nausea, vomiting, body aches, worsening drainage from the site, or worsening redness/swelling, please go to the nearest emergency department for further evaluation.   Schedule a follow-up appointment with your primary care provider in the next 3 to 5 days.

## 2023-01-15 NOTE — ED Triage Notes (Signed)
Pt reports an abscess on the left upper buttock for a couple weeks. States the area has been draining and believes it could be from a spider bite. Has hx of HS.

## 2023-01-20 ENCOUNTER — Other Ambulatory Visit: Payer: Self-pay | Admitting: *Deleted

## 2023-01-20 MED ORDER — LEVOTHYROXINE SODIUM 112 MCG PO TABS: ORAL_TABLET | ORAL | 3 refills | Status: DC

## 2023-03-12 ENCOUNTER — Other Ambulatory Visit: Payer: Self-pay | Admitting: Family Medicine

## 2023-03-28 ENCOUNTER — Other Ambulatory Visit: Payer: Self-pay | Admitting: *Deleted

## 2023-03-28 ENCOUNTER — Encounter: Payer: Self-pay | Admitting: Family Medicine

## 2023-03-28 ENCOUNTER — Other Ambulatory Visit: Payer: Self-pay | Admitting: Family Medicine

## 2023-03-28 MED ORDER — PREDNISONE 50 MG PO TABS: ORAL_TABLET | ORAL | 0 refills | Status: DC

## 2023-03-28 NOTE — Telephone Encounter (Signed)
Please advise 

## 2023-03-28 NOTE — Telephone Encounter (Signed)
Ok to send in a one-time refill prednisone 50 mg daily x 5 days but she needs an appointment if still having ongoing issues.

## 2023-04-07 ENCOUNTER — Other Ambulatory Visit: Payer: Self-pay | Admitting: Family Medicine

## 2023-09-03 ENCOUNTER — Other Ambulatory Visit: Payer: Self-pay | Admitting: *Deleted

## 2023-09-03 ENCOUNTER — Other Ambulatory Visit: Payer: Self-pay

## 2023-09-03 MED ORDER — GABAPENTIN 300 MG PO CAPS: ORAL_CAPSULE | ORAL | 3 refills | Status: AC

## 2023-09-24 ENCOUNTER — Other Ambulatory Visit: Payer: Self-pay | Admitting: Family Medicine

## 2023-09-25 ENCOUNTER — Other Ambulatory Visit: Payer: Self-pay | Admitting: Family Medicine

## 2023-09-25 MED ORDER — SYRINGE/NEEDLE (DISP) 22G X 1-1/2" 3 ML MISC
0 refills | Status: AC
Start: 2023-09-25 — End: ?

## 2023-09-25 MED ORDER — OMEPRAZOLE 40 MG PO CPDR: 40.0000 mg | DELAYED_RELEASE_CAPSULE | Freq: Every day | ORAL | 0 refills | Status: DC

## 2023-09-25 MED ORDER — PREDNISONE 50 MG PO TABS: ORAL_TABLET | ORAL | 0 refills | Status: AC

## 2023-10-06 ENCOUNTER — Other Ambulatory Visit: Payer: Self-pay | Admitting: Family Medicine

## 2023-11-18 ENCOUNTER — Other Ambulatory Visit: Payer: Self-pay | Admitting: Family Medicine

## 2023-11-18 ENCOUNTER — Telehealth: Admitting: Physician Assistant

## 2023-11-18 DIAGNOSIS — H66002 Acute suppurative otitis media without spontaneous rupture of ear drum, left ear: Secondary | ICD-10-CM

## 2023-11-18 DIAGNOSIS — J019 Acute sinusitis, unspecified: Secondary | ICD-10-CM

## 2023-11-18 DIAGNOSIS — B9689 Other specified bacterial agents as the cause of diseases classified elsewhere: Secondary | ICD-10-CM

## 2023-11-18 MED ORDER — CIPROFLOXACIN-DEXAMETHASONE 0.3-0.1 % OT SUSP: 4.0000 [drp] | Freq: Two times a day (BID) | OTIC | 0 refills | Status: AC

## 2023-11-18 MED ORDER — DOXYCYCLINE HYCLATE 100 MG PO TABS: 100.0000 mg | ORAL_TABLET | Freq: Two times a day (BID) | ORAL | 0 refills | Status: DC

## 2023-11-18 NOTE — Progress Notes (Signed)
 Virtual Visit Consent   Emily Maldonado, you are scheduled for a virtual visit with a [facility] provider today. Just as with appointments in the office, your consent must be obtained to participate. Your consent will be active for this visit and any virtual visit you may have with one of our providers in the next 365 days. If you have a MyChart account, a copy of this consent can be sent to you electronically.  As this is a virtual visit, video technology does not allow for your provider to perform a traditional examination. This may limit your provider's ability to fully assess your condition. If your provider identifies any concerns that need to be evaluated in person or the need to arrange testing (such as labs, EKG, etc.), we will make arrangements to do so. Although advances in technology are sophisticated, we cannot ensure that it will always work on either your end or our end. If the connection with a video visit is poor, the visit may have to be switched to a telephone visit. With either a video or telephone visit, we are not always able to ensure that we have a secure connection.  By engaging in this virtual visit, you consent to the provision of healthcare and authorize for your insurance to be billed (if applicable) for the services provided during this visit. Depending on your insurance coverage, you may receive a charge related to this service.  I need to obtain your verbal consent now. Are you willing to proceed with your visit today? Emily Maldonado has provided verbal consent on 11/18/2023 for a virtual visit (video or telephone). Angelia Kelp, PA-C  Date: 11/18/2023 6:05 PM   Virtual Visit via Video Note   I, Angelia Kelp, connected with  Emily Maldonado  ([id number], 09/23/1975) on 11/18/23 at  5:45 PM EDT by a video-enabled telemedicine application and verified that I am speaking with the correct person using two identifiers.  Location: Patient: Virtual Visit Location Patient:  Home Provider: Virtual Visit Location Provider: Home Office   I discussed the limitations of evaluation and management by telemedicine and the availability of in person appointments. The patient expressed understanding and agreed to proceed.    History of Present Illness: Emily Maldonado is a 48 y.o. who identifies as a female who was assigned female at birth, and is being seen today for left ear pain and sinus congestion.  HPI: Sinusitis This is a new problem. Episode onset: has chronic allergy issues, bad over last 3 weeks, worsened over last 4-5 days. The problem has been gradually worsening since onset. There has been no fever. The pain is moderate. Associated symptoms include congestion, coughing (this morning), ear pain (left), headaches, sinus pressure (left) and swollen glands. Pertinent negatives include no chills, diaphoresis, hoarse voice, shortness of breath or sore throat. Treatments tried: mucinex , dayquil, claritin, motrin. The treatment provided no relief.     Problems:  Patient Active Problem List   Diagnosis Date Noted   Perimenopause 10/17/2022   Allergic rhinitis 10/17/2022   Dyslipidemia 11/16/2021   Prediabetes 11/16/2021   Sciatica 11/15/2021   Pernicious anemia 11/15/2021   Typical atrial flutter (HCC) 03/08/2020   GERD (gastroesophageal reflux disease) 10/19/2019   Low vitamin D  level 01/18/2019   Nicotine dependence with current use 11/17/2018   Paroxysmal atrial fibrillation (HCC) 11/16/2018   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   Pulmonary nodule - needs repeat 12/2018 12/19/2017   Low vitamin B12 level 12/05/2017   PVC's (premature  ventricular contractions)    Morbid obesity (HCC)    Acne 08/22/2017   Hypothyroidism 08/22/2017   Hypertension 08/22/2017    Allergies:  Allergies  Allergen Reactions   Penicillins Anaphylaxis    Has patient had a PCN reaction causing immediate rash, facial/tongue/throat swelling, SOB or lightheadedness with  hypotension: /No Has patient had a PCN reaction causing severe rash involving mucus membranes or skin necrosis: No Has patient had a PCN reaction that required hospitalization: No Has patient had a PCN reaction occurring within the last 10 years: No If all of the above answers are "NO", then may proceed with Cephalosporin use.    Shellfish Allergy Anaphylaxis   Zithromax [Azithromycin] Rash   Medications:  Current Outpatient Medications:    ciprofloxacin-dexamethasone  (CIPRODEX) OTIC suspension, Place 4 drops into the left ear 2 (two) times daily for 7 days., Disp: 7.5 mL, Rfl: 0   doxycycline  (VIBRA -TABS) 100 MG tablet, Take 1 tablet (100 mg total) by mouth 2 (two) times daily., Disp: 20 tablet, Rfl: 0   albuterol  (VENTOLIN  HFA) 108 (90 Base) MCG/ACT inhaler, Inhale 2 puffs into the lungs every 4 (four) hours as needed for wheezing or shortness of breath., Disp: 1 each, Rfl: 0   azelastine  (ASTELIN ) 0.1 % nasal spray, Place 2 sprays into both nostrils 2 (two) times daily., Disp: 30 mL, Rfl: 12   B-D 5CC LUER-LOK SYR 22GX1-1/2 22G X 1-1/2" 5 ML MISC, USE DAILY AS DIRECTED., Disp: 30 each, Rfl: 1   Cholecalciferol (VITAMIN D3) 250 MCG (10000 UT) capsule, Take 10,000 Units by mouth daily., Disp: , Rfl:    cyanocobalamin  (VITAMIN B12) 1000 MCG/ML injection, INJECT 1 ML INTO MUSCLE MONTHLY, Disp: 12 mL, Rfl: 0   FERROUS SULFATE PO, Take 220 mg by mouth daily. , Disp: , Rfl:    furosemide  (LASIX ) 20 MG tablet, Take 1 tablet (20 mg total) by mouth daily. PLEASE CONTACT OUR OFFICE TO SCHEDULE AN OVERDUE OFFICE VISIT FOR FUTURE REFILLS. [phone]. THANK YOU. LAST AND FINAL ATTEMPT, Disp: 15 tablet, Rfl: 0   gabapentin  (NEURONTIN ) 300 MG capsule, 600mg  in the morning and 900mg  at night., Disp: 450 capsule, Rfl: 3   levothyroxine  (SYNTHROID ) 112 MCG tablet, TAKE 1 TABLET EVERY DAY ON AN EMPTY STOMACH, Disp: 90 tablet, Rfl: 3   magnesium  oxide (MAG-OX) 400 MG tablet, Take 400 mg by mouth 2 (two) times  daily. , Disp: , Rfl:    metoprolol  succinate (TOPROL -XL) 100 MG 24 hr tablet, TAKE 1 TABLET DAILY, Disp: 30 tablet, Rfl: 0   metoprolol  tartrate (LOPRESSOR ) 25 MG tablet, Take 1 tablet every 6 hours as needed for HR >100, Disp: 60 tablet, Rfl: 1   mupirocin  ointment (BACTROBAN ) 2 %, Apply 1 Application topically 2 (two) times daily., Disp: 22 g, Rfl: 0   omeprazole  (PRILOSEC) 40 MG capsule, TAKE 1 CAPSULE DAILY (NEED OFFICE VISIT FOR FUTURE REFILLS), Disp: 30 capsule, Rfl: 0   potassium chloride  SA (KLOR-CON  M) 20 MEQ tablet, Take 1 tablet (20 mEq total) by mouth daily., Disp: 90 tablet, Rfl: 1   predniSONE  (DELTASONE ) 50 MG tablet, Take 1 tablet daily for 5 days., Disp: 5 tablet, Rfl: 0   spironolactone  (ALDACTONE ) 25 MG tablet, Take 1 tablet (25 mg total) by mouth daily. PLEASE CONTACT OUR OFFICE TO SCHEDULE AN OVERDUE OFFICE VISIT FOR FUTURE REFILLS. [phone]. THANK YOU. LAST AND FINAL ATTEMPT, Disp: 15 tablet, Rfl: 0   SYRINGE-NEEDLE, DISP, 3 ML 22G X 1-1/2" 3 ML MISC, Use daily as directed., Disp:  50 each, Rfl: 0  Observations/Objective: Patient is well-developed, well-nourished in no acute distress.  Resting comfortably at home.  Head is normocephalic, atraumatic.  No labored breathing.  Speech is clear and coherent with logical content.  Patient is alert and oriented at baseline.    Assessment and Plan: 1. Acute bacterial sinusitis (Primary) - doxycycline  (VIBRA -TABS) 100 MG tablet; Take 1 tablet (100 mg total) by mouth 2 (two) times daily.  Dispense: 20 tablet; Refill: 0  2. Non-recurrent acute suppurative otitis media of left ear without spontaneous rupture of tympanic membrane - ciprofloxacin-dexamethasone  (CIPRODEX) OTIC suspension; Place 4 drops into the left ear 2 (two) times daily for 7 days.  Dispense: 7.5 mL; Refill: 0  - Worsening symptoms that have not responded to OTC medications.  - Will give Doxycycline  and Ciprodex ear drops - Continue saline nasal rinses -  Could consider to add Flonase (Fluticasone) nasal spray over the counter for possible eustachian tube dysfunction - Steam and humidifier can help - Warm compress to ear - Stay well hydrated and get plenty of rest.  - Seek in person evaluation if no symptom improvement or if symptoms worsen   Follow Up Instructions: I discussed the assessment and treatment plan with the patient. The patient was provided an opportunity to ask questions and all were answered. The patient agreed with the plan and demonstrated an understanding of the instructions.  A copy of instructions were sent to the patient via MyChart unless otherwise noted below.    The patient was advised to call back or seek an in-person evaluation if the symptoms worsen or if the condition fails to improve as anticipated.    Angelia Kelp, PA-C

## 2023-11-18 NOTE — Patient Instructions (Signed)
 Emily Maldonado, thank you for joining Angelia Kelp, PA-C for today's virtual visit.  While this provider is not your primary care provider (PCP), if your PCP is located in our provider database this encounter information will be shared with them immediately following your visit.   A [facility] MyChart account gives you access to today's visit and all your visits, tests, and labs performed at [facility] " click here if you don't have a [facility] MyChart account or go to [email or web address]  Consent: (Patient) Emily Maldonado provided verbal consent for this virtual visit at the beginning of the encounter.  Current Medications:  Current Outpatient Medications:    ciprofloxacin-dexamethasone  (CIPRODEX) OTIC suspension, Place 4 drops into the left ear 2 (two) times daily for 7 days., Disp: 7.5 mL, Rfl: 0   doxycycline  (VIBRA -TABS) 100 MG tablet, Take 1 tablet (100 mg total) by mouth 2 (two) times daily., Disp: 20 tablet, Rfl: 0   albuterol  (VENTOLIN  HFA) 108 (90 Base) MCG/ACT inhaler, Inhale 2 puffs into the lungs every 4 (four) hours as needed for wheezing or shortness of breath., Disp: 1 each, Rfl: 0   azelastine  (ASTELIN ) 0.1 % nasal spray, Place 2 sprays into both nostrils 2 (two) times daily., Disp: 30 mL, Rfl: 12   B-D 5CC LUER-LOK SYR 22GX1-1/2 22G X 1-1/2" 5 ML MISC, USE DAILY AS DIRECTED., Disp: 30 each, Rfl: 1   Cholecalciferol (VITAMIN D3) 250 MCG (10000 UT) capsule, Take 10,000 Units by mouth daily., Disp: , Rfl:    cyanocobalamin  (VITAMIN B12) 1000 MCG/ML injection, INJECT 1 ML INTO MUSCLE MONTHLY, Disp: 12 mL, Rfl: 0   FERROUS SULFATE PO, Take 220 mg by mouth daily. , Disp: , Rfl:    furosemide  (LASIX ) 20 MG tablet, Take 1 tablet (20 mg total) by mouth daily. PLEASE CONTACT OUR OFFICE TO SCHEDULE AN OVERDUE OFFICE VISIT FOR FUTURE REFILLS. [phone]. THANK YOU. LAST AND FINAL ATTEMPT, Disp: 15 tablet, Rfl: 0   gabapentin  (NEURONTIN ) 300 MG capsule, 600mg  in the  morning and 900mg  at night., Disp: 450 capsule, Rfl: 3   levothyroxine  (SYNTHROID ) 112 MCG tablet, TAKE 1 TABLET EVERY DAY ON AN EMPTY STOMACH, Disp: 90 tablet, Rfl: 3   magnesium  oxide (MAG-OX) 400 MG tablet, Take 400 mg by mouth 2 (two) times daily. , Disp: , Rfl:    metoprolol  succinate (TOPROL -XL) 100 MG 24 hr tablet, TAKE 1 TABLET DAILY, Disp: 30 tablet, Rfl: 0   metoprolol  tartrate (LOPRESSOR ) 25 MG tablet, Take 1 tablet every 6 hours as needed for HR >100, Disp: 60 tablet, Rfl: 1   mupirocin  ointment (BACTROBAN ) 2 %, Apply 1 Application topically 2 (two) times daily., Disp: 22 g, Rfl: 0   omeprazole  (PRILOSEC) 40 MG capsule, TAKE 1 CAPSULE DAILY (NEED OFFICE VISIT FOR FUTURE REFILLS), Disp: 30 capsule, Rfl: 0   potassium chloride  SA (KLOR-CON  M) 20 MEQ tablet, Take 1 tablet (20 mEq total) by mouth daily., Disp: 90 tablet, Rfl: 1   predniSONE  (DELTASONE ) 50 MG tablet, Take 1 tablet daily for 5 days., Disp: 5 tablet, Rfl: 0   spironolactone  (ALDACTONE ) 25 MG tablet, Take 1 tablet (25 mg total) by mouth daily. PLEASE CONTACT OUR OFFICE TO SCHEDULE AN OVERDUE OFFICE VISIT FOR FUTURE REFILLS. [phone]. THANK YOU. LAST AND FINAL ATTEMPT, Disp: 15 tablet, Rfl: 0   SYRINGE-NEEDLE, DISP, 3 ML 22G X 1-1/2" 3 ML MISC, Use daily as directed., Disp: 50 each, Rfl: 0   Medications ordered in this encounter:  Meds ordered this  encounter  Medications   doxycycline  (VIBRA -TABS) 100 MG tablet    Sig: Take 1 tablet (100 mg total) by mouth 2 (two) times daily.    Dispense:  20 tablet    Refill:  0    Supervising Provider:   LAMPTEY, PHILIP O [[id number]]   ciprofloxacin-dexamethasone  (CIPRODEX) OTIC suspension    Sig: Place 4 drops into the left ear 2 (two) times daily for 7 days.    Dispense:  7.5 mL    Refill:  0    Supervising Provider:   Corine Dice [[id number]]     *If you need refills on other medications prior to your next appointment, please contact your pharmacy*  Follow-Up: Call back or  seek an in-person evaluation if the symptoms worsen or if the condition fails to improve as anticipated.  [facility] [phone]  Other Instructions  Otitis Media, Adult  Otitis media occurs when there is inflammation and fluid in the middle ear with signs and symptoms of an acute infection. The middle ear is a part of the ear that contains bones for hearing as well as air that helps send sounds to the brain. When infected fluid builds up in this space, it causes pressure and can lead to an ear infection. The eustachian tube connects the middle ear to the back of the nose (nasopharynx) and normally allows air into the middle ear. If the eustachian tube becomes blocked, fluid can build up and become infected. What are the causes? This condition is caused by a blockage in the eustachian tube. This can be caused by mucus or by swelling of the tube. Problems that can cause a blockage include: A cold or other upper respiratory infection. Allergies. An irritant, such as tobacco smoke. Enlarged adenoids. The adenoids are areas of soft tissue located high in the back of the throat, behind the nose and the roof of the mouth. They are part of the body's defense system (immune system). A mass in the nasopharynx. Damage to the ear caused by pressure changes (barotrauma). What increases the risk? You are more likely to develop this condition if you: Smoke or are exposed to tobacco smoke. Have an opening in the roof of your mouth (cleft palate). Have gastroesophageal reflux. Have an immune system disorder. What are the signs or symptoms? Symptoms of this condition include: Ear pain. Fever. Decreased hearing. Tiredness (lethargy). Fluid leaking from the ear, if the eardrum is ruptured or has burst. Ringing in the ear. How is this diagnosed?  This condition is diagnosed with a physical exam. During the exam, your health care provider will use an instrument called an otoscope to look  in your ear and check for redness, swelling, and fluid. He or she will also ask about your symptoms. Your health care provider may also order tests, such as: A pneumatic otoscopy. This is a test to check the movement of the eardrum. It is done by squeezing a small amount of air into the ear. A tympanogram. This is a test that shows how well the eardrum moves in response to air pressure in the ear canal. It provides a graph for your health care provider to review. How is this treated? This condition can go away on its own within 3-5 days. But if the condition is caused by a bacterial infection and does not go away on its own, or if it keeps coming back, your health care provider may: Prescribe antibiotic medicine to treat the infection. Prescribe or  recommend medicines to control pain. Follow these instructions at home: Take over-the-counter and prescription medicines only as told by your health care provider. If you were prescribed an antibiotic medicine, take it as told by your health care provider. Do not stop taking the antibiotic even if you start to feel better. Keep all follow-up visits. This is important. Contact a health care provider if: You have bleeding from your nose. There is a lump on your neck. You are not feeling better in 5 days. You feel worse instead of better. Get help right away if: You have severe pain that is not controlled with medicine. You have swelling, redness, or pain around your ear. You have stiffness in your neck. A part of your face is not moving (paralyzed). The bone behind your ear (mastoid bone) is tender when you touch it. You develop a severe headache. Summary Otitis media is redness, soreness, and swelling of the middle ear, usually resulting in pain and decreased hearing. This condition can go away on its own within 3-5 days. If the problem does not go away in 3-5 days, your health care provider may give you medicines to treat the infection. If you  were prescribed an antibiotic medicine, take it as told by your health care provider. Follow all instructions that were given to you by your health care provider. This information is not intended to replace advice given to you by your health care provider. Make sure you discuss any questions you have with your health care provider. Document Revised: 10/02/2020 Document Reviewed: 10/02/2020 Elsevier Patient Education  2024 Elsevier Inc.   If you have been instructed to have an in-person evaluation today at a local Urgent Care facility, please use the link below. It will take you to a list of all of our available [facility], including address, phone number and hours of operation. Please do not delay care.  [facility]  If you or a family member do not have a primary care provider, use the link below to schedule a visit and establish care. When you choose a [facility] primary care physician or advanced practice provider, you gain a long-term partner in health. Find a Primary Care Provider  Learn more about [facility]'s in-office and virtual care options:  [facility] - Get Care Now

## 2023-12-23 LAB — COMPREHENSIVE METABOLIC PANEL WITH GFR
Albumin: 4.1 (ref 3.5–5.0)
Calcium: 9.7 (ref 8.7–10.7)
Globulin: 2.9
TSH: 4.05 (ref 0.41–5.90)
eGFR: 106

## 2023-12-23 LAB — HEPATIC FUNCTION PANEL
ALT: 14 U/L (ref 7–35)
AST: 12 — AB (ref 13–35)
Alkaline Phosphatase: 96 (ref 25–125)
Bilirubin, Direct: 0.4 (ref 0.01–0.4)
Bilirubin, Total: 2.9

## 2023-12-23 LAB — TSH: TSH: 4.05 (ref 0.41–5.90)

## 2023-12-23 LAB — CBC AND DIFFERENTIAL
HCT: 48 — AB (ref 36–46)
Hemoglobin: 15.9 (ref 12.0–16.0)
Platelets: 221 K/uL (ref 150–400)
WBC: 10.1

## 2023-12-23 LAB — BASIC METABOLIC PANEL WITH GFR
BUN: 9 (ref 4–21)
CO2: 22 (ref 13–22)
Chloride: 99 (ref 99–108)
Creatinine: 0.7 (ref 0.5–1.1)
Glucose: 91
Potassium: 4.2 meq/L (ref 3.5–5.1)
Sodium: 137 (ref 137–147)

## 2023-12-23 LAB — LIPID PANEL
Cholesterol: 184 (ref 0–200)
HDL: 22 — AB (ref 35–70)
LDL Cholesterol: 121
Triglycerides: 233 — AB (ref 40–160)

## 2023-12-23 LAB — CBC: RBC: 5.26 — AB (ref 3.87–5.11)

## 2024-01-01 ENCOUNTER — Encounter: Payer: Self-pay | Admitting: Family Medicine

## 2024-01-06 ENCOUNTER — Encounter: Payer: Self-pay | Admitting: Family Medicine

## 2024-01-06 MED ORDER — LEVOTHYROXINE SODIUM 112 MCG PO TABS: ORAL_TABLET | ORAL | 3 refills | Status: DC

## 2024-01-06 MED ORDER — OMEPRAZOLE 40 MG PO CPDR: 40.0000 mg | DELAYED_RELEASE_CAPSULE | Freq: Every day | ORAL | 1 refills | Status: DC

## 2024-01-06 MED ORDER — METOPROLOL SUCCINATE ER 100 MG PO TB24: 100.0000 mg | ORAL_TABLET | Freq: Every day | ORAL | 1 refills | Status: DC

## 2024-02-12 ENCOUNTER — Ambulatory Visit: Admitting: Family

## 2024-02-12 ENCOUNTER — Encounter: Admitting: Family Medicine

## 2024-02-12 VITALS — BP 118/75 | HR 81 | Temp 97.1°F | Ht 65.0 in | Wt 271.0 lb

## 2024-02-12 DIAGNOSIS — E782 Mixed hyperlipidemia: Secondary | ICD-10-CM | POA: Diagnosis not present

## 2024-02-12 DIAGNOSIS — E785 Hyperlipidemia, unspecified: Secondary | ICD-10-CM

## 2024-02-12 DIAGNOSIS — N951 Menopausal and female climacteric states: Secondary | ICD-10-CM | POA: Diagnosis not present

## 2024-02-12 DIAGNOSIS — J019 Acute sinusitis, unspecified: Secondary | ICD-10-CM | POA: Diagnosis not present

## 2024-02-12 DIAGNOSIS — Z0001 Encounter for general adult medical examination with abnormal findings: Secondary | ICD-10-CM | POA: Diagnosis not present

## 2024-02-12 DIAGNOSIS — B9689 Other specified bacterial agents as the cause of diseases classified elsewhere: Secondary | ICD-10-CM

## 2024-02-12 DIAGNOSIS — Z0000 Encounter for general adult medical examination without abnormal findings: Secondary | ICD-10-CM

## 2024-02-12 DIAGNOSIS — Z1211 Encounter for screening for malignant neoplasm of colon: Secondary | ICD-10-CM

## 2024-02-12 DIAGNOSIS — L732 Hidradenitis suppurativa: Secondary | ICD-10-CM

## 2024-02-12 DIAGNOSIS — E063 Autoimmune thyroiditis: Secondary | ICD-10-CM | POA: Diagnosis not present

## 2024-02-12 MED ORDER — LEVOTHYROXINE SODIUM 125 MCG PO TABS: 125.0000 ug | ORAL_TABLET | Freq: Every day | ORAL | 0 refills | Status: DC

## 2024-02-12 MED ORDER — DOXYCYCLINE HYCLATE 100 MG PO TABS
100.0000 mg | ORAL_TABLET | Freq: Two times a day (BID) | ORAL | 0 refills | Status: AC
Start: 2024-02-12 — End: ?

## 2024-02-12 MED ORDER — ROSUVASTATIN CALCIUM 5 MG PO TABS: 5.0000 mg | ORAL_TABLET | ORAL | 0 refills | Status: DC

## 2024-02-12 MED ORDER — VENLAFAXINE HCL ER 37.5 MG PO CP24: 37.5000 mg | ORAL_CAPSULE | Freq: Every day | ORAL | 2 refills | Status: DC

## 2024-02-12 NOTE — Assessment & Plan Note (Addendum)
 Hyperlipidemia with an 11% ASCVD risk of negative cardiac event in next 10y. Discussed benefits of low dose even a few days per week of statins and checking a cardiac calcium  score test to assess calcified plaque. Explained that a zero calcium  score indicates a low risk for MI or CVA, but still can be a 10-20% risk of non-calcified plaque, while a positive score necessitates statin therapy. - Sending low-dose Crestor , starting 1-2d/week x2w, then increase to 3d/week. - Provided information on cardiac calcium  score test. - Recheck lipids in 2-39mos with PCP

## 2024-02-12 NOTE — Assessment & Plan Note (Signed)
 Moderate to severe hidradenitis suppurativa with intermittent flares, currently experiencing pain and possible cellulitis on her right side/back with 5-6 small & large erythematous raised lumps. Previous treatment with Cosentyx was ineffective. Currently on spironolactone  100 mg daily and intermittent doxycycline  for flares. - Prescribe doxycycline  100mg  bid x10d for current flare. - Follow up with dermatologist in 2 weeks.

## 2024-02-12 NOTE — Patient Instructions (Addendum)
 It was very nice to see you today!   Ok to take up to 20,000 units of Vitamin D3 weekly, can be in separate doses biweekly. I have sent over low dose generic Crestor  to your mail order pharmacy and 125mcg of Levothyroxine . Schedule a 2-3 month follow up to recheck your labs. Doxycycline  refill has been sent to your local pharmacy. A referral has been sent to discuss a Colonoscopy. They will call you directly to schedule.    PLEASE NOTE:  If you had any lab tests please let us  know if you have not heard back within a few days. You may see your results on MyChart before we have a chance to review them but we will give you a call once they are reviewed by us . If we ordered any referrals today, please let us  know if you have not heard from their office within the next week.

## 2024-02-12 NOTE — Assessment & Plan Note (Signed)
 Hypothyroidism with TSH levels approaching 4. She reports feeling better with TSH under 2. Currently on levothyroxine  112 mcg for years. - Increase levothyroxine  to 125 mcg daily to address joint aches and temperature regulation issues. - Recheck TSH in 8-12 weeks.

## 2024-02-12 NOTE — Progress Notes (Signed)
 Phone [phone]  Subjective:   Patient is a 48 y.o. female presenting for annual physical.    Chief Complaint  Patient presents with  . Annual Exam  Discussed the use of AI scribe software for clinical note transcription with the patient, who gave verbal consent to proceed.  History of Present Illness Emily Maldonado is a 48 year old female here for annual physical and has hx of perimenopause and hypothyroidism who presents with mood swings and joint pain as well as hidradenitis outbreak.  Mood disturbance and menstrual irregularity - Significant mood swings described as 'awful', impacting relationship with husband - Mood swings associated with perimenopausal transition - Irregular menstrual cycles with spotting but no full bleeding for three to four months; previous six-month gap without menses - Family history of female cancers influences decision to avoid hormone replacement therapy  Arthralgia - Joint pain associated with thyroid  function - Improvement in joint pain when TSH is under 2; current TSH at 4  Thyroid  dysfunction - History of hypothyroidism - Thyroid  levels climbing but remain within high normal limits - On levothyroxine  112 mcg for many years  Hidradenitis suppurativa - Flare-ups occur twice a month - Painful, erythematous areas on back and bottom, sometimes with spontaneous drainage - Hx of Humira and Cosentyx, but not effective. - Currently taking spironolactone  100 mg daily  Hyperlipidemia and nutritional concerns - Concern about cholesterol levels, recently had labs done via [facility], elevated TC 184, LDL 121, and triglycerides 233, HDL 22, low. - Reduced red meat intake to manage cholesterol - Allergic to fish and can't take fish oil - Concern about vitamin B absorption issue  Supplement use - Takes vitamin D  10,000 IU weekly; previously took 10,000 IU daily but reduced dose due to concerns about high intake - Takes magnesium  and potassium  supplements  Atrial fibrillation - History of atrial fibrillation with previous ablations - Avoids alcohol due to arrhythmia history  See problem oriented charting- ROS- full  review of systems was completed and negative except for what is noted in HPI above.  The following were reviewed and entered/updated in epic: Past Medical History:  Diagnosis Date  . A-fib (HCC)   . Abnormal Pap smear of cervix   . Anemia    b12  . Anxiety   . B12 deficiency anemia   . Childhood asthma   . Former tobacco use   . Hypertension   . Hypothyroidism   . Migraines    aura  . Morbid obesity (HCC)   . Ovarian cyst   . Sleep apnea    Use C-pap    Patient Active Problem List   Diagnosis Date Noted  . Perimenopause 10/17/2022  . Allergic rhinitis 10/17/2022  . Dyslipidemia 11/16/2021  . Prediabetes 11/16/2021  . Sciatica 11/15/2021  . Pernicious anemia 11/15/2021  . Typical atrial flutter (HCC) 03/08/2020  . GERD (gastroesophageal reflux disease) 10/19/2019  . Low vitamin D  level 01/18/2019  . Nicotine dependence with current use 11/17/2018  . Paroxysmal atrial fibrillation (HCC) 11/16/2018  . 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 Pulmonary nodule - needs repeat 12/2018 12/19/2017  . Low vitamin B12 level 12/05/2017  . PVC's (premature ventricular contractions)   . Morbid obesity (HCC)   . Acne 08/22/2017  . Hypothyroidism 08/22/2017  . Hypertension 08/22/2017   Past Surgical History:  Procedure Laterality Date  . ATRIAL FIBRILLATION ABLATION N/A 04/18/2020   Procedure: ATRIAL FIBRILLATION ABLATION;  Surgeon: Kelsie Agent, MD;  Location: [facility];  Service: Cardiovascular;  Laterality: N/A;  . BREAST BIOPSY Left 2014  . BREAST EXCISIONAL BIOPSY Right   . CERVICAL BIOPSY  W/ LOOP ELECTRODE EXCISION    . LEFT HEART CATH AND CORONARY ANGIOGRAPHY N/A 12/04/2017   Procedure: LEFT HEART CATH AND CORONARY ANGIOGRAPHY;  Surgeon: Verlin Lonni BIRCH, MD;   Location: [facility]AS[facility];  Service: Cardiovascular;  Laterality: N/A;  . TONSILLECTOMY    . TUBAL LIGATION      Family History  Problem Relation Age of Onset  . Breast cancer Mother 46  . Heart failure Mother   . Atrial fibrillation Mother   . Cervical cancer Mother   . Diabetes Mother   . Hypertension Mother   . Heart failure Father   . Hypertension Father   . Heart failure Maternal Grandmother   . Atrial fibrillation Maternal Grandmother   . Stroke Maternal Grandmother   . Uterine cancer Maternal Grandmother   . Diabetes Brother   . Hypertension Brother   . Uterine cancer Maternal Aunt     Medications- reviewed and updated Current Outpatient Medications  Medication Sig Dispense Refill  . rosuvastatin  (CRESTOR ) 5 MG tablet Take 1 tablet (5 mg total) by mouth as directed. Start with 1 pill on Monday and Thursdays for 2 weeks, then increase to 1 pill Monday, Wednesday and Fridays if tolerated. 45 tablet 0  . venlafaxine  XR (EFFEXOR  XR) 37.5 MG 24 hr capsule Take 1 capsule (37.5 mg total) by mouth daily with breakfast. 30 capsule 2  . albuterol  (VENTOLIN  HFA) 108 (90 Base) MCG/ACT inhaler Inhale 2 puffs into the lungs every 4 (four) hours as needed for wheezing or shortness of breath. 1 each 0  . azelastine  (ASTELIN ) 0.1 % nasal spray Place 2 sprays into both nostrils 2 (two) times daily. 30 mL 12  . B-D 5CC LUER-LOK SYR 22GX1-1/2 22G X 1-1/2 5 ML MISC USE DAILY AS DIRECTED. 30 each 1  . Cholecalciferol (VITAMIN D3) 250 MCG (10000 UT) capsule Take 10,000 Units by mouth daily.    . cyanocobalamin  (VITAMIN B12) 1000 MCG/ML injection INJECT 1 ML INTO MUSCLE MONTHLY 12 mL 0  . doxycycline  (VIBRA -TABS) 100 MG tablet Take 1 tablet (100 mg total) by mouth 2 (two) times daily. 20 tablet 0  . FERROUS SULFATE PO Take 220 mg by mouth daily.     . furosemide  (LASIX ) 20 MG tablet Take 1 tablet (20 mg total) by mouth daily. PLEASE CONTACT OUR OFFICE TO SCHEDULE AN OVERDUE OFFICE VISIT FOR  FUTURE REFILLS. [phone]. THANK YOU. LAST AND FINAL ATTEMPT 15 tablet 0  . gabapentin  (NEURONTIN ) 300 MG capsule 600mg  in the morning and 900mg  at night. 450 capsule 3  . levothyroxine  (SYNTHROID ) 125 MCG tablet Take 1 tablet (125 mcg total) by mouth daily. 90 tablet 0  . magnesium  oxide (MAG-OX) 400 MG tablet Take 400 mg by mouth 2 (two) times daily.     . metoprolol  succinate (TOPROL -XL) 100 MG 24 hr tablet Take 1 tablet (100 mg total) by mouth daily. 90 tablet 1  . metoprolol  tartrate (LOPRESSOR ) 25 MG tablet Take 1 tablet every 6 hours as needed for HR >100 60 tablet 1  . mupirocin  ointment (BACTROBAN ) 2 % Apply 1 Application topically 2 (two) times daily. 22 g 0  . omeprazole  (PRILOSEC) 40 MG capsule Take 1 capsule (40 mg total) by mouth daily. 90 capsule 1  . potassium chloride  SA (KLOR-CON  M) 20 MEQ tablet Take 1 tablet (20 mEq total) by mouth daily.  90 tablet 1  . predniSONE  (DELTASONE ) 50 MG tablet Take 1 tablet daily for 5 days. 5 tablet 0  . spironolactone  (ALDACTONE ) 25 MG tablet Take 1 tablet (25 mg total) by mouth daily. PLEASE CONTACT OUR OFFICE TO SCHEDULE AN OVERDUE OFFICE VISIT FOR FUTURE REFILLS. [phone]. THANK YOU. LAST AND FINAL ATTEMPT 15 tablet 0  . SYRINGE-NEEDLE, DISP, 3 ML 22G X 1-1/2 3 ML MISC Use daily as directed. 50 each 0   No current facility-administered medications for this visit.    Allergies-reviewed and updated Allergies  Allergen Reactions  . Penicillins Anaphylaxis    Has patient had a PCN reaction causing immediate rash, facial/tongue/throat swelling, SOB or lightheadedness with hypotension: /No Has patient had a PCN reaction causing severe rash involving mucus membranes or skin necrosis: No Has patient had a PCN reaction that required hospitalization: No Has patient had a PCN reaction occurring within the last 10 years: No If all of the above answers are NO, then may proceed with Cephalosporin use.   . Shellfish Allergy Anaphylaxis  .  Zithromax [Azithromycin] Rash    Social History   Social History Narrative  . Not on file    Objective:  BP 118/75   Pulse 81   Temp (!) 97.1 F (36.2 C) (Temporal)   Ht 5' 5 (1.651 m)   Wt 271 lb (122.9 kg)   SpO2 96%   BMI 45.10 kg/m  Physical Exam Vitals and nursing note reviewed.  Constitutional:      Appearance: Normal appearance. She is morbidly obese.  HENT:     Head: Normocephalic.     Right Ear: Tympanic membrane normal.     Left Ear: Tympanic membrane normal.     Nose: Nose normal.     Mouth/Throat:     Mouth: Mucous membranes are moist.  Eyes:     Pupils: Pupils are equal, round, and reactive to light.  Cardiovascular:     Rate and Rhythm: Normal rate and regular rhythm.  Pulmonary:     Effort: Pulmonary effort is normal.     Breath sounds: Normal breath sounds.  Musculoskeletal:        General: Normal range of motion.     Cervical back: Normal range of motion.  Lymphadenopathy:     Cervical: No cervical adenopathy.  Skin:    General: Skin is warm and dry.  Neurological:     Mental Status: She is alert.  Psychiatric:        Mood and Affect: Mood normal.        Behavior: Behavior normal.     Assessment and Plan   Health Maintenance counseling: 1. Anticipatory guidance: Patient counseled regarding regular dental exams q6 months, eye exams,  avoiding smoking and second hand smoke, limiting alcohol to 1 beverage per day, no illicit drugs.   2. Risk factor reduction:  Advised patient of need for regular exercise and diet rich with fruits and vegetables to reduce risk of heart attack and stroke.  Wt Readings from Last 3 Encounters:  02/12/24 271 lb (122.9 kg)  10/17/22 269 lb (122 kg)  03/28/22 273 lb 12.8 oz (124.2 kg)   3. Immunizations/screenings/ancillary studies Immunization History  Administered Date(s) Administered  . HPV 9-valent 03/24/2018  . Influenza Inj Mdck Quad Pf 05/21/2017  . Moderna Sars-Covid-2 Vaccination 07/12/2019,  08/09/2019, 05/12/2020  . Tdap 11/15/2021   Health Maintenance Due  Topic Date Due  . HPV VACCINES (2 - 3-dose SCDM series) 04/21/2018  . Colonoscopy  Never done  . Cervical Cancer Screening (HPV/Pap Cotest)  03/25/2020  . MAMMOGRAM  12/18/2023    4. Cervical cancer screening- due 2026 5. Breast cancer screening-  mammogram last done 2019 6. Colon cancer screening - never done 7. Skin cancer screening- advised regular sunscreen use. Denies worrisome, changing, or new skin lesions.  8. Birth control/STD check- none/N/A 9. Osteoporosis screening- N/A 10. Alcohol screening: none 11. Smoking associated screening (lung cancer screening, AAA screen 65-75, UA)-  smoker 3/4 ppd 12. Exercise- none, works too many hours  Assessment & Plan Annual Physical - Labs done thru [facility] on 12/23/2023 - All wnl except elevated TSH and lipids, discussed treatment below.  Perimenopausal symptoms Perimenopausal symptoms with mood swings and temperature control issues. Family history contraindicates hormone replacement therapy. - Prescribed Effexor  (venlafaxine ) 37.5 mg daily in the morning for mood swings and temperature control. - Discussed potential side effects of Effexor , including dizziness, headaches, and its potential stimulant effect. - F/U in 1 month with PCP  Hypothyroidism Hypothyroidism with TSH levels approaching 4. She reports feeling better with TSH under 2. Currently on levothyroxine  112 mcg for years. - Increase levothyroxine  to 125 mcg daily to address joint aches and temperature regulation issues. - Recheck TSH in 8-12 weeks.  Hidradenitis suppurativa, moderate to severe Moderate to severe hidradenitis suppurativa with intermittent flares, currently experiencing pain and possible cellulitis on her right side/back with 5-6 small & large erythematous raised lumps. Previous treatment with Cosentyx was ineffective. Currently on spironolactone  100 mg daily and intermittent doxycycline  for  flares. - Prescribe doxycycline  100mg  bid x10d for current flare. - Follow up with dermatologist in 2 weeks.  Hyperlipidemia Hyperlipidemia with an 11% ASCVD risk of negative cardiac event in next 10y. Discussed benefits of low dose even a few days per week of statins and checking a cardiac calcium  score test to assess calcified plaque. Explained that a zero calcium  score indicates a low risk for MI or CVA, but still can be a 10-20% risk of non-calcified plaque, while a positive score necessitates statin therapy. Sending low-dose Crestor , starting 1-2d/week x2w, then increase to 3d/week. - Provided information on cardiac calcium  score test. - Recheck lipids in 2-23m w/PCP  Recommended follow up:  Return in about 4 weeks (around 03/11/2024) for med refills w/PCP - Effexor ; 2 month f/u for thyroid  & lipid labs w/PCP. No future appointments.   Lucius Krabbe, NP

## 2024-02-12 NOTE — Assessment & Plan Note (Signed)
 Perimenopausal symptoms with mood swings and temperature control issues. Family history contraindicates hormone replacement therapy. - Prescribed Effexor  (venlafaxine ) 37.5 mg daily in the morning for mood swings and temperature control. - Discussed potential side effects of Effexor , including dizziness, headaches, and its potential stimulant effect. - F/U in 1 month with PCP

## 2024-02-26 DIAGNOSIS — L821 Other seborrheic keratosis: Secondary | ICD-10-CM | POA: Diagnosis not present

## 2024-02-26 DIAGNOSIS — D492 Neoplasm of unspecified behavior of bone, soft tissue, and skin: Secondary | ICD-10-CM | POA: Diagnosis not present

## 2024-02-26 DIAGNOSIS — L732 Hidradenitis suppurativa: Secondary | ICD-10-CM | POA: Diagnosis not present

## 2024-03-05 ENCOUNTER — Other Ambulatory Visit: Payer: Self-pay | Admitting: Family

## 2024-03-05 DIAGNOSIS — N951 Menopausal and female climacteric states: Secondary | ICD-10-CM

## 2024-04-13 ENCOUNTER — Other Ambulatory Visit: Payer: Self-pay | Admitting: *Deleted

## 2024-04-13 ENCOUNTER — Encounter: Payer: Self-pay | Admitting: Family Medicine

## 2024-04-13 MED ORDER — VENLAFAXINE HCL ER 75 MG PO CP24: 75.0000 mg | ORAL_CAPSULE | Freq: Every day | ORAL | 1 refills | Status: DC

## 2024-04-13 NOTE — Telephone Encounter (Signed)
 I appreciate the update. We can increase to 75 mg daily and have her follow-up in a few weeks.

## 2024-04-13 NOTE — Telephone Encounter (Signed)
**Note De-identified  [place] Obfuscation** Please advise 

## 2024-04-26 ENCOUNTER — Other Ambulatory Visit: Payer: Self-pay | Admitting: Family

## 2024-04-26 DIAGNOSIS — E063 Autoimmune thyroiditis: Secondary | ICD-10-CM

## 2024-05-06 ENCOUNTER — Other Ambulatory Visit: Payer: Self-pay | Admitting: Family Medicine

## 2024-05-24 ENCOUNTER — Other Ambulatory Visit: Payer: Self-pay | Admitting: Family

## 2024-05-24 DIAGNOSIS — E782 Mixed hyperlipidemia: Secondary | ICD-10-CM

## 2024-06-21 ENCOUNTER — Other Ambulatory Visit: Payer: Self-pay | Admitting: Family Medicine
